# Patient Record
Sex: Female | Born: 1940 | Race: White | Hispanic: No | Marital: Married | Smoking: Former smoker
Health system: Southern US, Community
[De-identification: ages and names within clinical notes are randomized; demographics above are authoritative.]

## PROBLEM LIST (undated history)

## (undated) DIAGNOSIS — K222 Esophageal obstruction: Secondary | ICD-10-CM

## (undated) DIAGNOSIS — M1990 Unspecified osteoarthritis, unspecified site: Secondary | ICD-10-CM

## (undated) DIAGNOSIS — E785 Hyperlipidemia, unspecified: Secondary | ICD-10-CM

## (undated) DIAGNOSIS — J329 Chronic sinusitis, unspecified: Secondary | ICD-10-CM

## (undated) DIAGNOSIS — U071 COVID-19: Secondary | ICD-10-CM

## (undated) DIAGNOSIS — I6529 Occlusion and stenosis of unspecified carotid artery: Secondary | ICD-10-CM

## (undated) DIAGNOSIS — G500 Trigeminal neuralgia: Secondary | ICD-10-CM

## (undated) DIAGNOSIS — I8290 Acute embolism and thrombosis of unspecified vein: Secondary | ICD-10-CM

## (undated) DIAGNOSIS — K449 Diaphragmatic hernia without obstruction or gangrene: Secondary | ICD-10-CM

## (undated) DIAGNOSIS — K219 Gastro-esophageal reflux disease without esophagitis: Secondary | ICD-10-CM

## (undated) HISTORY — DX: Acute embolism and thrombosis of unspecified vein: I82.90

## (undated) HISTORY — DX: Gastro-esophageal reflux disease without esophagitis: K21.9

## (undated) HISTORY — DX: Hyperlipidemia, unspecified: E78.5

## (undated) HISTORY — DX: Occlusion and stenosis of unspecified carotid artery: I65.29

## (undated) HISTORY — DX: COVID-19: U07.1

## (undated) HISTORY — DX: Chronic sinusitis, unspecified: J32.9

## (undated) HISTORY — DX: Esophageal obstruction: K22.2

## (undated) HISTORY — DX: Diaphragmatic hernia without obstruction or gangrene: K44.9

## (undated) HISTORY — PX: CYSTECTOMY: SUR359

## (undated) HISTORY — DX: Unspecified osteoarthritis, unspecified site: M19.90

## (undated) HISTORY — DX: Trigeminal neuralgia: G50.0

---

## 1968-07-18 HISTORY — PX: BREAST LUMPECTOMY: SHX2

## 1998-05-20 ENCOUNTER — Ambulatory Visit (HOSPITAL_COMMUNITY): Admission: RE | Admit: 1998-05-20 | Discharge: 1998-05-20 | Payer: Self-pay | Admitting: Gastroenterology

## 1999-06-22 ENCOUNTER — Other Ambulatory Visit: Admission: RE | Admit: 1999-06-22 | Discharge: 1999-06-22 | Payer: Self-pay | Admitting: *Deleted

## 2000-06-13 ENCOUNTER — Encounter: Payer: Self-pay | Admitting: Gastroenterology

## 2000-06-13 ENCOUNTER — Encounter: Admission: RE | Admit: 2000-06-13 | Discharge: 2000-06-13 | Payer: Self-pay | Admitting: Gastroenterology

## 2000-06-23 ENCOUNTER — Other Ambulatory Visit: Admission: RE | Admit: 2000-06-23 | Discharge: 2000-06-23 | Payer: Self-pay | Admitting: *Deleted

## 2001-06-27 ENCOUNTER — Other Ambulatory Visit: Admission: RE | Admit: 2001-06-27 | Discharge: 2001-06-27 | Payer: Self-pay | Admitting: Obstetrics and Gynecology

## 2002-07-03 ENCOUNTER — Other Ambulatory Visit: Admission: RE | Admit: 2002-07-03 | Discharge: 2002-07-03 | Payer: Self-pay | Admitting: Obstetrics and Gynecology

## 2002-07-29 ENCOUNTER — Encounter: Admission: RE | Admit: 2002-07-29 | Discharge: 2002-07-29 | Payer: Self-pay | Admitting: Obstetrics and Gynecology

## 2002-07-29 ENCOUNTER — Encounter: Payer: Self-pay | Admitting: Obstetrics and Gynecology

## 2003-05-14 ENCOUNTER — Ambulatory Visit (HOSPITAL_COMMUNITY): Admission: RE | Admit: 2003-05-14 | Discharge: 2003-05-14 | Payer: Self-pay | Admitting: Gastroenterology

## 2004-10-26 ENCOUNTER — Ambulatory Visit (HOSPITAL_COMMUNITY): Admission: RE | Admit: 2004-10-26 | Discharge: 2004-10-26 | Payer: Self-pay | Admitting: Gastroenterology

## 2011-09-27 ENCOUNTER — Other Ambulatory Visit: Payer: Self-pay

## 2011-10-24 ENCOUNTER — Ambulatory Visit (INDEPENDENT_AMBULATORY_CARE_PROVIDER_SITE_OTHER): Payer: Medicare Other | Admitting: General Surgery

## 2011-10-24 ENCOUNTER — Encounter (INDEPENDENT_AMBULATORY_CARE_PROVIDER_SITE_OTHER): Payer: Self-pay | Admitting: General Surgery

## 2011-10-24 VITALS — BP 128/68 | HR 72 | Temp 98.9°F | Resp 16 | Ht 66.5 in | Wt 133.4 lb

## 2011-10-24 DIAGNOSIS — R928 Other abnormal and inconclusive findings on diagnostic imaging of breast: Secondary | ICD-10-CM

## 2011-10-24 NOTE — Patient Instructions (Signed)
You will be scheduled for a right breast biopsy with needle localization in the near future.   Breast Biopsy WHY YOU NEED A BIOPSY Your caregiver has recommended that you have a breast tissue sample taken (biopsy). This is done to be certain that the lump or abnormality found in your breast is not cancerous (malignant). During a biopsy, a small piece of tissue is removed, so it can be examined under a microscope by a specialist (pathologist) who looks at tissues and cells and diagnoses abnormalities in them. Most lumps (tumors) or abnormalities, on or in the breast, are not cancerous (benign). However, biopsies are taken when your caregiver cannot be absolutely certain of what is wrong only from doing a physical exam, mammogram (breast X-ray), or other studies. A breast biopsy can tell you whether nothing more needs to be done, or you need more surgery or another type of treatment. A biopsy is done when there is:  Any undiagnosed breast mass.   Nipple abnormalities, dimpling, crusting, or ulcerations.   Calcium deposits (calcifications) or abnormalities seen on your mammogram, ultrasound, or MRI.   Suspicious changes in the breast (thickening, asymmetry) seen on mammogram.   Abnormal discharge from the nipple, especially blood.   Redness, swelling, and pain of the breast.  HOW A BIOPSY IS PERFORMED A biopsy is often performed on an outpatient basis (you go home the same day). This can be done in a hospital, clinic, or surgical center. Tissue samples (biopsies) are often done under local anesthesia (area is numbed). Sometimes general anesthetics are required, in which case you sleep through the procedure. Biopsies may remove the entire lump, a small piece of the lump, or a small sliver of tissue removed by needle. TYPES OF BREAST BIOPSY  Fine needle aspiration. A thin needle is placed through the skin, to the lump or cyst, and cells are removed.   Core needle biopsy. A large needle with a  special tip is placed through the skin, to the abnormality, and a piece of tissue is removed.   Stereotactic biopsy. A core needle with a special X-ray is used, to direct the needle to the lump or abnormal area, which is difficult to feel or cannot be felt.   Vacuum-assisted biopsy. A hollow probe and a gentle vacuum remove a sample of tissue.   Ultrasound guided core needle biopsy. You lie on your stomach, with your breast through an opening, and a high frequency ultrasound helps guide the needle to the area of the abnormality.   Open biopsy. An incision is made in the breast, and a piece of the lump or the whole lump is removed.  LET YOUR CAREGIVER KNOW ABOUT:  Allergies.   Medicines taken, including herbs, eye drops, over-the-counter medicines, and creams.   Use of steroids (by mouth or creams).   Previous problems with anesthetics or Novocaine.   If you are taking aspirin or blood thinners.   Possibility of pregnancy, if this applies.   History of blood clots (thrombophlebitis).   History of bleeding or blood problems.   Previous surgery.   Other health problems.  RISKS AND COMPLICATIONS   Bleeding.   Infection.   Allergy to medicines.   Bruising and swelling of the breast.   Alteration in the shape of the breast.   Not finding the lump or abnormality.   Needing more surgery.  BEFORE THE PROCEDURE  You should arrive 60 minutes prior to your procedure or as directed.   Check-in at the admissions  desk, to fill out necessary forms, if you are not preregistered.   There will be consent forms to sign, prior to the procedure.   There is a waiting area for your family, while you are having your biopsy.   Try to have someone with you, to drive you home.   Do not smoke for 2 weeks before the surgery.   Let your caregiver know if you develop a cold or an infection.   Do not drink alcohol for at least 24 hours before surgery.   Wear a good support bra to the  surgery.  AFTER THE PROCEDURE  After surgery, you will be taken to the recovery area, where a nurse will watch and check your progress. Once you are awake, stable, and taking fluids well, if there are no other problems, you will be allowed to go home.   Ice packs applied to your operative site may help with discomfort and keep the swelling down.   You may resume normal diet and activities as directed. Avoid strenuous activities affecting the arm on the side of the biopsy, such as tennis, swimming, heavy lifting (more than 10 pounds) or pulling.   Bruising in the breast is normal following this procedure.   Wearing a support bra, even to bed, may be more comfortable. The bra will also help keep the dressing on.   Change dressings as directed.   Your doctor may apply a pressure dressing on your breast for 24 to 48 hours.   Only take over-the-counter or prescription medicines for pain, discomfort, or fever as directed by your caregiver.   Do not take aspirin, because it can cause bleeding.  HOME CARE INSTRUCTIONS   You may resume your usual diet.   Have someone drive you home after the surgery.   Do not do any exercise, driving, lifting or general activities without your caregiver's permission.   Take medicines and over-the-counter medicines, as ordered by your caregiver.   Keep your postoperative appointments as recommended.   Do not drink alcohol while taking pain medicine.  Finding out the results of your test Not all test results are available during your visit. If your test results are not back during the visit, make an appointment with your caregiver to find out the results. Do not assume everything is normal if you have not heard from your caregiver or the medical facility. It is important for you to follow up on all of your test results.  SEEK MEDICAL CARE IF:   You notice redness, swelling, or increasing pain in the wound.   You notice a bad smell coming from the wound or  dressing.   You develop a rash.   You need stronger pain medicine.   You are having an allergic reaction or problems with your medicines.  SEEK IMMEDIATE MEDICAL CARE IF:   You have difficulty breathing.   You have a fever.   There is increased bleeding (more than a small spot) from the wound.   Pus is coming from the wound.   The wound is breaking open.  Document Released: 07/04/2005 Document Revised: 06/23/2011 Document Reviewed: 05/22/2009 [facility] Patient Information 2012 [place].

## 2011-10-24 NOTE — Progress Notes (Signed)
Patient ID: Ashley Carrillo, female   DOB: 1940/12/14, 71 y.o.   MRN: [id number]  Chief Complaint  Patient presents with  . Breast Problem    new pt- eval Rt breast radial scar    HPI SHAINDEL Carrillo is a 71 y.o. female.  She is referred by Dr. Jacob Moores at [facility] health for evaluation of abnormal mammogram of the right breast, upper outer quadrant. Ashley Carrillo is her primary care physician.  The patient has no prior history of breast cancer. She had a right breast biopsy in 1970 and a needle biopsy of the right breast in 1990 for benign disease, possibly cysts. Recent screening mammogram showed a small, 6 mm irregular mass in the right breast at the 10:00 position 8 cm from the nipple. Image guided biopsy shows radial scar and complex sclerosing lesion. She was referred for consideration of excision of this area to rule out occult breast cancer.  The patient has no breast symptoms. No pain, no lump, no nipple discharge.  Family history is negative for breast cancer and negative for ovarian cancer.  Her only medical problem is chronic ulcerative colitis followed by Earle Gell. HPI  Past Medical History  Diagnosis Date  . Ulcerative colitis   . Arthritis   . GERD (gastroesophageal reflux disease)   . Hyperlipidemia   . Ulcerative colitis     Past Surgical History  Procedure Date  . Breast lumpectomy 1970    right breast- benign  . Cystectomy     finger    Family History  Problem Relation Age of Onset  . Heart disease Mother     Social History History  Substance Use Topics  . Smoking status: Former Smoker    Quit date: 07/18/1968  . Smokeless tobacco: Not on file  . Alcohol Use: No    Allergies  Allergen Reactions  . Codeine Nausea And Vomiting    Current Outpatient Prescriptions  Medication Sig Dispense Refill  . Cimetidine (ACID REDUCER PO) Take 75 mg by mouth daily.      . fexofenadine (ALLEGRA) 180 MG tablet Take 180 mg by mouth daily.      .  Loperamide HCl (ANTI-DIARRHEAL PO) Take 1 mg by mouth daily.      . SULFASALAZINE PO Take 1,000 mg by mouth 2 (two) times daily.        Review of Systems Review of Systems  Constitutional: Negative for fever, chills and unexpected weight change.  HENT: Negative for hearing loss, congestion, sore throat, trouble swallowing and voice change.   Eyes: Negative for visual disturbance.  Respiratory: Negative for cough and wheezing.   Cardiovascular: Negative for chest pain, palpitations and leg swelling.  Gastrointestinal: Negative for nausea, vomiting, abdominal pain, diarrhea, constipation, blood in stool, abdominal distention and anal bleeding.  Genitourinary: Negative for hematuria, vaginal bleeding and difficulty urinating.  Musculoskeletal: Negative for arthralgias.  Skin: Negative for rash and wound.  Neurological: Negative for seizures, syncope and headaches.  Hematological: Negative for adenopathy. Does not bruise/bleed easily.  Psychiatric/Behavioral: Negative for confusion.    Blood pressure 128/68, pulse 72, temperature 98.9 F (37.2 C), temperature source Temporal, resp. rate 16, height 5' 6.5" (1.689 m), weight 133 lb 6.4 oz (60.51 kg).  Physical Exam Physical Exam  Constitutional: She is oriented to person, place, and time. She appears well-developed and well-nourished. No distress.  HENT:  Head: Normocephalic and atraumatic.  Nose: Nose normal.  Mouth/Throat: No oropharyngeal exudate.  Eyes: Conjunctivae and EOM are normal.  Pupils are equal, round, and reactive to light. Left eye exhibits no discharge. No scleral icterus.  Neck: Neck supple. No JVD present. No tracheal deviation present. No thyromegaly present.  Cardiovascular: Normal rate, regular rhythm, normal heart sounds and intact distal pulses.   No murmur heard. Pulmonary/Chest: Effort normal and breath sounds normal. No respiratory distress. She has no wheezes. She has no rales. She exhibits no tenderness.     Abdominal: Soft. Bowel sounds are normal. She exhibits no distension and no mass. There is no tenderness. There is no rebound and no guarding.  Musculoskeletal: She exhibits no edema and no tenderness.  Lymphadenopathy:    She has no cervical adenopathy.  Neurological: She is alert and oriented to person, place, and time. She exhibits normal muscle tone. Coordination normal.  Skin: Skin is warm. No rash noted. She is not diaphoretic. No erythema. No pallor.  Psychiatric: She has a normal mood and affect. Her behavior is normal. Judgment and thought content normal.    Data Reviewed I have reviewed her mammograms, her pathology report.  Assessment    Abnormal mammogram right breast, upper outer quadrant, radial scar and sclerosing lesion by image guided biopsy. Excision of this area is warranted to rule out occult carcinoma. Risk of this is approximately 9%.  Chronic ulcerative colitis    Plan    The patient would like this area excised, which is appropriate medically. We will schedule for right partial mastectomy with needle localization.  I have discussed the indications and details and techniques of surgery with the patient and her husband. The risks have been outlined including but not limited to bleeding, infection, cosmetic deformity, skin necrosis, reoperation for complications or cancer, and other unforeseen problems. She understands these issues. Her questions were answered. She agrees with this plan.       Edsel Petrin. Dalbert Batman, M.D., [facility] General and Minimally invasive Surgery Breast and Colorectal Surgery Office:   [phone] Pager:   [phone]  10/24/2011, 11:54 AM

## 2011-10-28 ENCOUNTER — Encounter (HOSPITAL_BASED_OUTPATIENT_CLINIC_OR_DEPARTMENT_OTHER): Payer: Self-pay | Admitting: *Deleted

## 2011-10-28 NOTE — Progress Notes (Signed)
No labs needed

## 2011-10-31 NOTE — H&P (Signed)
Ashley Carrillo    MRN: [id number]   Description: 71 year old female  Provider: Adin Hector, MD  Department: [facility]     Diagnoses     Abnormal mammogram   - Primary    793.80      Reason for Visit     Breast Problem    new pt- eval Rt breast radial scar     Vitals    BP Pulse Temp(Src) Resp Ht Wt    128/68  72  98.9 F (37.2 C) (Temporal)  16  5' 6.5" (1.689 m)  133 lb 6.4 oz (60.51 kg)      BMI -  21.21 kg/m2                History and Physical   Adin Hector, MD d Patient ID: Ashley Carrillo, female   DOB: Apr 09, 1941, 71 y.o.   MRN: [id number]             HPI Ashley Carrillo is a 71 y.o. female.  She is referred by Dr. Jacob Moores at [facility] health for evaluation of abnormal mammogram of the right breast, upper outer quadrant. [name] is her primary care physician.  The patient has no prior history of breast cancer. She had a right breast biopsy in 1970 and a needle biopsy of the right breast in 1990 for benign disease, possibly cysts. Recent screening mammogram showed a small, 6 mm irregular mass in the right breast at the 10:00 position 8 cm from the nipple. Image guided biopsy shows radial scar and complex sclerosing lesion. She was referred for consideration of excision of this area to rule out occult breast cancer.  The patient has no breast symptoms. No pain, no lump, no nipple discharge.  Family history is negative for breast cancer and negative for ovarian cancer.  Her only medical problem is chronic ulcerative colitis followed by Earle Gell.     Past Medical History   Diagnosis  Date   .  Ulcerative colitis     .  Arthritis     .  GERD (gastroesophageal reflux disease)     .  Hyperlipidemia     .  Ulcerative colitis         Past Surgical History   Procedure  Date   .  Breast lumpectomy  1970       right breast- benign   .  Cystectomy         finger       Family History   Problem  Relation  Age of Onset   .  Heart  disease  Mother        Social History History   Substance Use Topics   .  Smoking status:  Former Smoker       Quit date:  07/18/1968   .  Smokeless tobacco:  Not on file   .  Alcohol Use:  No       Allergies   Allergen  Reactions   .  Codeine  Nausea And Vomiting       Current Outpatient Prescriptions   Medication  Sig  Dispense  Refill   .  Cimetidine (ACID REDUCER PO)  Take 75 mg by mouth daily.         .  fexofenadine (ALLEGRA) 180 MG tablet  Take 180 mg by mouth daily.         .  Loperamide HCl (ANTI-DIARRHEAL PO)  Take 1  mg by mouth daily.         .  SULFASALAZINE PO  Take 1,000 mg by mouth 2 (two) times daily.            Review of Systems  Constitutional: Negative for fever, chills and unexpected weight change.  HENT: Negative for hearing loss, congestion, sore throat, trouble swallowing and voice change.   Eyes: Negative for visual disturbance.  Respiratory: Negative for cough and wheezing.   Cardiovascular: Negative for chest pain, palpitations and leg swelling.  Gastrointestinal: Negative for nausea, vomiting, abdominal pain, diarrhea, constipation, blood in stool, abdominal distention and anal bleeding.  Genitourinary: Negative for hematuria, vaginal bleeding and difficulty urinating.  Musculoskeletal: Negative for arthralgias.  Skin: Negative for rash and wound.  Neurological: Negative for seizures, syncope and headaches.  Hematological: Negative for adenopathy. Does not bruise/bleed easily.  Psychiatric/Behavioral: Negative for confusion.    Blood pressure 128/68, pulse 72, temperature 98.9 F (37.2 C), temperature source Temporal, resp. rate 16, height 5' 6.5" (1.689 m), weight 133 lb 6.4 oz (60.51 kg).  Physical Exam  Constitutional: She is oriented to person, place, and time. She appears well-developed and well-nourished. No distress.  HENT:   Head: Normocephalic and atraumatic.   Nose: Nose normal.   Mouth/Throat: No oropharyngeal exudate.  Eyes:  Conjunctivae and EOM are normal. Pupils are equal, round, and reactive to light. Left eye exhibits no discharge. No scleral icterus.  Neck: Neck supple. No JVD present. No tracheal deviation present. No thyromegaly present.  Cardiovascular: Normal rate, regular rhythm, normal heart sounds and intact distal pulses.    No murmur heard. Pulmonary/Chest: Effort normal and breath sounds normal. No respiratory distress. She has no wheezes. She has no rales. She exhibits no tenderness. Right breast with old scar inferiorly. No mass bilaterally, no other skin change bilaterally, no axillary adenopathy bilaterally. Abdominal: Soft. Bowel sounds are normal. She exhibits no distension and no mass. There is no tenderness. There is no rebound and no guarding.  Musculoskeletal: She exhibits no edema and no tenderness.  Lymphadenopathy:    She has no cervical adenopathy.  Neurological: She is alert and oriented to person, place, and time. She exhibits normal muscle tone. Coordination normal.  Skin: Skin is warm. No rash noted. She is not diaphoretic. No erythema. No pallor.  Psychiatric: She has a normal mood and affect. Her behavior is normal. Judgment and thought content normal.    Data Reviewed I have reviewed her mammograms, her pathology report.   Assessment   Abnormal mammogram right breast, upper outer quadrant, radial scar and sclerosing lesion by image guided biopsy. Excision of this area is warranted to rule out occult carcinoma. Risk of this is approximately 9%.  Chronic ulcerative colitis   Plan The patient would like this area excised, which is appropriate medically. We will schedule for right partial mastectomy with needle localization.  I have discussed the indications and details and techniques of surgery with the patient and her husband. The risks have been outlined including but not limited to bleeding, infection, cosmetic deformity, skin necrosis, reoperation for complications or  cancer, and other unforeseen problems. She understands these issues. Her questions were answered. She agrees with this plan.    Edsel Petrin. Dalbert Batman, M.D., [facility] General and Minimally invasive Surgery Breast and Colorectal Surgery Office:   [phone] Pager:   [phone]

## 2011-11-01 ENCOUNTER — Encounter (HOSPITAL_BASED_OUTPATIENT_CLINIC_OR_DEPARTMENT_OTHER): Payer: Self-pay | Admitting: Anesthesiology

## 2011-11-01 ENCOUNTER — Encounter (INDEPENDENT_AMBULATORY_CARE_PROVIDER_SITE_OTHER): Payer: Self-pay | Admitting: Family Medicine

## 2011-11-01 ENCOUNTER — Encounter (HOSPITAL_BASED_OUTPATIENT_CLINIC_OR_DEPARTMENT_OTHER)
Admission: RE | Disposition: A | Discharge status: Home / Self Care | Payer: Self-pay | Source: Ambulatory Visit | Attending: General Surgery

## 2011-11-01 ENCOUNTER — Ambulatory Visit (HOSPITAL_BASED_OUTPATIENT_CLINIC_OR_DEPARTMENT_OTHER): Payer: Medicare Other | Admitting: Anesthesiology

## 2011-11-01 ENCOUNTER — Encounter (HOSPITAL_BASED_OUTPATIENT_CLINIC_OR_DEPARTMENT_OTHER): Payer: Self-pay | Admitting: *Deleted

## 2011-11-01 ENCOUNTER — Telehealth (INDEPENDENT_AMBULATORY_CARE_PROVIDER_SITE_OTHER): Payer: Self-pay

## 2011-11-01 ENCOUNTER — Ambulatory Visit (HOSPITAL_BASED_OUTPATIENT_CLINIC_OR_DEPARTMENT_OTHER)
Admission: RE | Admit: 2011-11-01 | Discharge: 2011-11-01 | Disposition: A | Discharge status: Home / Self Care | Payer: Medicare Other | Source: Ambulatory Visit | Attending: General Surgery | Admitting: General Surgery

## 2011-11-01 DIAGNOSIS — N6019 Diffuse cystic mastopathy of unspecified breast: Secondary | ICD-10-CM

## 2011-11-01 DIAGNOSIS — K219 Gastro-esophageal reflux disease without esophagitis: Secondary | ICD-10-CM | POA: Insufficient documentation

## 2011-11-01 DIAGNOSIS — N6029 Fibroadenosis of unspecified breast: Secondary | ICD-10-CM | POA: Insufficient documentation

## 2011-11-01 DIAGNOSIS — R928 Other abnormal and inconclusive findings on diagnostic imaging of breast: Secondary | ICD-10-CM

## 2011-11-01 HISTORY — PX: BREAST BIOPSY: SHX20

## 2011-11-01 SURGERY — BREAST BIOPSY WITH NEEDLE LOCALIZATION
Anesthesia: General | Site: Breast | Laterality: Right | Wound class: Clean

## 2011-11-01 MED ORDER — MORPHINE SULFATE 2 MG/ML IJ SOLN: 2.0000 mg | INTRAMUSCULAR | Status: DC | PRN

## 2011-11-01 MED ORDER — FENTANYL CITRATE 0.05 MG/ML IJ SOLN: 50.0000 ug | INTRAMUSCULAR | Status: DC | PRN

## 2011-11-01 MED ORDER — DEXAMETHASONE SODIUM PHOSPHATE 4 MG/ML IJ SOLN
INTRAMUSCULAR | Status: DC | PRN
  Administered 2011-11-01: 10 mg via INTRAVENOUS

## 2011-11-01 MED ORDER — ACETAMINOPHEN 325 MG PO TABS: 650.0000 mg | ORAL_TABLET | ORAL | Status: DC | PRN

## 2011-11-01 MED ORDER — HYDROMORPHONE HCL PF 1 MG/ML IJ SOLN: 0.2500 mg | INTRAMUSCULAR | Status: DC | PRN

## 2011-11-01 MED ORDER — MIDAZOLAM HCL 2 MG/2ML IJ SOLN: 1.0000 mg | INTRAMUSCULAR | Status: DC | PRN

## 2011-11-01 MED ORDER — ONDANSETRON HCL 4 MG/2ML IJ SOLN
4.0000 mg | Freq: Four times a day (QID) | INTRAMUSCULAR | Status: DC | PRN
  Administered 2011-11-01: 4 mg via INTRAVENOUS

## 2011-11-01 MED ORDER — CHLORHEXIDINE GLUCONATE 4 % EX LIQD: 1.0000 "application " | Freq: Once | CUTANEOUS | Status: DC

## 2011-11-01 MED ORDER — LIDOCAINE HCL (CARDIAC) 20 MG/ML IV SOLN
INTRAVENOUS | Status: DC | PRN
  Administered 2011-11-01: 40 mg via INTRAVENOUS

## 2011-11-01 MED ORDER — HYDROCODONE-ACETAMINOPHEN 5-325 MG PO TABS: 1.0000 | ORAL_TABLET | ORAL | Status: AC | PRN

## 2011-11-01 MED ORDER — OXYCODONE HCL 5 MG PO TABS: 5.0000 mg | ORAL_TABLET | ORAL | Status: DC | PRN

## 2011-11-01 MED ORDER — LORAZEPAM 2 MG/ML IJ SOLN: 1.0000 mg | Freq: Once | INTRAMUSCULAR | Status: DC | PRN

## 2011-11-01 MED ORDER — SODIUM CHLORIDE 0.9 % IJ SOLN: 3.0000 mL | Freq: Two times a day (BID) | INTRAMUSCULAR | Status: DC

## 2011-11-01 MED ORDER — CEFAZOLIN SODIUM 1-5 GM-% IV SOLN
1.0000 g | INTRAVENOUS | Status: AC
  Administered 2011-11-01: 1 g via INTRAVENOUS

## 2011-11-01 MED ORDER — BUPIVACAINE-EPINEPHRINE 0.5% -1:200000 IJ SOLN
INTRAMUSCULAR | Status: DC | PRN
  Administered 2011-11-01: 10 mL

## 2011-11-01 MED ORDER — ACETAMINOPHEN 650 MG RE SUPP: 650.0000 mg | RECTAL | Status: DC | PRN

## 2011-11-01 MED ORDER — PROPOFOL 10 MG/ML IV EMUL
INTRAVENOUS | Status: DC | PRN
  Administered 2011-11-01: 120 mg via INTRAVENOUS

## 2011-11-01 MED ORDER — MIDAZOLAM HCL 5 MG/5ML IJ SOLN
INTRAMUSCULAR | Status: DC | PRN
  Administered 2011-11-01: 1 mg via INTRAVENOUS

## 2011-11-01 MED ORDER — SODIUM CHLORIDE 0.9 % IJ SOLN: 3.0000 mL | INTRAMUSCULAR | Status: DC | PRN

## 2011-11-01 MED ORDER — SODIUM CHLORIDE 0.9 % IV SOLN: 12.5000 mg | Freq: Once | INTRAVENOUS | Status: DC

## 2011-11-01 MED ORDER — ONDANSETRON HCL 4 MG/2ML IJ SOLN
INTRAMUSCULAR | Status: DC | PRN
  Administered 2011-11-01: 4 mg via INTRAVENOUS

## 2011-11-01 MED ORDER — FENTANYL CITRATE 0.05 MG/ML IJ SOLN
INTRAMUSCULAR | Status: DC | PRN
  Administered 2011-11-01: 50 ug via INTRAVENOUS

## 2011-11-01 MED ORDER — EPHEDRINE SULFATE 50 MG/ML IJ SOLN
INTRAMUSCULAR | Status: DC | PRN
  Administered 2011-11-01 (×2): 10 mg via INTRAVENOUS

## 2011-11-01 MED ORDER — PROMETHAZINE HCL 25 MG/ML IJ SOLN
6.2500 mg | INTRAMUSCULAR | Status: DC | PRN
  Administered 2011-11-01: 12.5 mg via INTRAVENOUS

## 2011-11-01 MED ORDER — HEPARIN SODIUM (PORCINE) 5000 UNIT/ML IJ SOLN: 5000.0000 [IU] | Freq: Once | INTRAMUSCULAR | Status: DC

## 2011-11-01 MED ORDER — SODIUM CHLORIDE 0.9 % IV SOLN: 250.0000 mL | INTRAVENOUS | Status: DC | PRN

## 2011-11-01 MED ORDER — SODIUM CHLORIDE 0.9 % IV SOLN: INTRAVENOUS | Status: DC

## 2011-11-01 MED ORDER — LACTATED RINGERS IV SOLN
INTRAVENOUS | Status: DC
  Administered 2011-11-01: 12:00:00 via INTRAVENOUS

## 2011-11-01 SURGICAL SUPPLY — 51 items
BANDAGE ELASTIC 6 VELCRO ST LF (GAUZE/BANDAGES/DRESSINGS) IMPLANT
BENZOIN TINCTURE PRP APPL 2/3 (GAUZE/BANDAGES/DRESSINGS) IMPLANT
BLADE HEX COATED 2.75 (ELECTRODE) ×2 IMPLANT
BLADE SURG 15 STRL LF DISP TIS (BLADE) ×2 IMPLANT
BLADE SURG 15 STRL SS (BLADE) ×2
CANISTER SUCTION 1200CC (MISCELLANEOUS) ×2 IMPLANT
CHLORAPREP W/TINT 26ML (MISCELLANEOUS) ×2 IMPLANT
CLOTH BEACON ORANGE TIMEOUT ST (SAFETY) ×2 IMPLANT
COVER MAYO STAND STRL (DRAPES) ×2 IMPLANT
COVER TABLE BACK 60X90 (DRAPES) ×2 IMPLANT
DECANTER SPIKE VIAL GLASS SM (MISCELLANEOUS) IMPLANT
DERMABOND ADVANCED (GAUZE/BANDAGES/DRESSINGS) ×1
DERMABOND ADVANCED .7 DNX12 (GAUZE/BANDAGES/DRESSINGS) ×1 IMPLANT
DEVICE DUBIN W/COMP PLATE 8390 (MISCELLANEOUS) ×2 IMPLANT
DRAPE LAPAROTOMY TRNSV 102X78 (DRAPE) IMPLANT
DRAPE PED LAPAROTOMY (DRAPES) ×2 IMPLANT
DRAPE UTILITY XL STRL (DRAPES) ×2 IMPLANT
ELECT REM PT RETURN 9FT ADLT (ELECTROSURGICAL) ×2
ELECTRODE REM PT RTRN 9FT ADLT (ELECTROSURGICAL) ×1 IMPLANT
GAUZE SPONGE 4X4 12PLY STRL LF (GAUZE/BANDAGES/DRESSINGS) IMPLANT
GAUZE SPONGE 4X4 16PLY XRAY LF (GAUZE/BANDAGES/DRESSINGS) IMPLANT
GLOVE EUDERMIC 7 POWDERFREE (GLOVE) ×2 IMPLANT
GOWN PREVENTION PLUS XLARGE (GOWN DISPOSABLE) ×2 IMPLANT
GOWN PREVENTION PLUS XXLARGE (GOWN DISPOSABLE) ×2 IMPLANT
KIT MARKER MARGIN INK (KITS) ×2 IMPLANT
NEEDLE HYPO 22GX1.5 SAFETY (NEEDLE) IMPLANT
NEEDLE HYPO 25X1 1.5 SAFETY (NEEDLE) ×2 IMPLANT
NS IRRIG 1000ML POUR BTL (IV SOLUTION) ×2 IMPLANT
PACK BASIN DAY SURGERY FS (CUSTOM PROCEDURE TRAY) ×2 IMPLANT
PENCIL BUTTON HOLSTER BLD 10FT (ELECTRODE) ×2 IMPLANT
SLEEVE SCD COMPRESS KNEE MED (MISCELLANEOUS) ×2 IMPLANT
SPONGE LAP 4X18 X RAY DECT (DISPOSABLE) ×2 IMPLANT
STAPLER VISISTAT 35W (STAPLE) IMPLANT
STRIP CLOSURE SKIN 1/2X4 (GAUZE/BANDAGES/DRESSINGS) IMPLANT
SUT ETHILON 4 0 PS 2 18 (SUTURE) IMPLANT
SUT MON AB 4-0 PC3 18 (SUTURE) ×2 IMPLANT
SUT SILK 2 0 SH (SUTURE) ×2 IMPLANT
SUT VIC AB 2-0 SH 27 (SUTURE)
SUT VIC AB 2-0 SH 27XBRD (SUTURE) IMPLANT
SUT VIC AB 3-0 FS2 27 (SUTURE) IMPLANT
SUT VIC AB 4-0 P-3 18XBRD (SUTURE) IMPLANT
SUT VIC AB 4-0 P3 18 (SUTURE)
SUT VICRYL 3-0 CR8 SH (SUTURE) ×2 IMPLANT
SUT VICRYL 4-0 PS2 18IN ABS (SUTURE) IMPLANT
SYR BULB 3OZ (MISCELLANEOUS) ×2 IMPLANT
SYR CONTROL 10ML LL (SYRINGE) ×2 IMPLANT
TAPE HYPAFIX 4 X10 (GAUZE/BANDAGES/DRESSINGS) IMPLANT
TOWEL OR NON WOVEN STRL DISP B (DISPOSABLE) ×2 IMPLANT
TUBE CONNECTING 20X1/4 (TUBING) ×2 IMPLANT
WATER STERILE IRR 1000ML POUR (IV SOLUTION) IMPLANT
YANKAUER SUCT BULB TIP NO VENT (SUCTIONS) ×2 IMPLANT

## 2011-11-01 NOTE — Anesthesia Procedure Notes (Signed)
Procedure Name: LMA Insertion Date/Time: 11/01/2011 12:15 PM Performed by: Lyndee Leo Pre-anesthesia Checklist: Patient identified, Emergency Drugs available, Suction available and Patient being monitored Patient Re-evaluated:Patient Re-evaluated prior to inductionOxygen Delivery Method: Circle system utilized Preoxygenation: Pre-oxygenation with 100% oxygen Intubation Type: IV induction Ventilation: Mask ventilation without difficulty LMA: LMA inserted LMA Size: 4.0 Number of attempts: 1 Tube secured with: Tape Dental Injury: Teeth and Oropharynx as per pre-operative assessment

## 2011-11-01 NOTE — Anesthesia Postprocedure Evaluation (Signed)
  Anesthesia Post-op Note  Patient: Ashley Carrillo  Procedure(s) Performed: Procedure(s) (LRB): BREAST BIOPSY WITH NEEDLE LOCALIZATION (Right)  Patient Location: PACU  Anesthesia Type: General  Level of Consciousness: awake and alert   Airway and Oxygen Therapy: Patient Spontanous Breathing  Post-op Pain: mild  Post-op Assessment: Post-op Vital signs reviewed, Patient's Cardiovascular Status Stable, Respiratory Function Stable, Patent Airway, No signs of Nausea or vomiting, Adequate PO intake and Pain level controlled  Post-op Vital Signs: stable  Complications: No apparent anesthesia complications

## 2011-11-01 NOTE — Transfer of Care (Signed)
Immediate Anesthesia Transfer of Care Note  Patient: Ashley Carrillo  Procedure(s) Performed: Procedure(s) (LRB): BREAST BIOPSY WITH NEEDLE LOCALIZATION (Right)  Patient Location: PACU  Anesthesia Type: General  Level of Consciousness: sedated  Airway & Oxygen Therapy: Patient Spontanous Breathing and Patient connected to face mask oxygen  Post-op Assessment: Report given to PACU RN and Post -op Vital signs reviewed and stable  Post vital signs: Reviewed and stable  Complications: No apparent anesthesia complications

## 2011-11-01 NOTE — Anesthesia Preprocedure Evaluation (Signed)
Anesthesia Evaluation  Patient identified by MRN, date of birth, ID band Patient awake    Reviewed: Allergy & Precautions, H&P , NPO status , Patient's Chart, lab work & pertinent test results  Airway Mallampati: I TM Distance: >3 FB Neck ROM: Full    Dental   Pulmonary    Pulmonary exam normal       Cardiovascular     Neuro/Psych    GI/Hepatic PUD, GERD-  ,  Endo/Other    Renal/GU      Musculoskeletal   Abdominal   Peds  Hematology   Anesthesia Other Findings   Reproductive/Obstetrics                           Anesthesia Physical Anesthesia Plan  ASA: II  Anesthesia Plan: General   Post-op Pain Management:    Induction: Intravenous  Airway Management Planned: LMA  Additional Equipment:   Intra-op Plan:   Post-operative Plan: Extubation in OR  Informed Consent: I have reviewed the patients History and Physical, chart, labs and discussed the procedure including the risks, benefits and alternatives for the proposed anesthesia with the patient or authorized representative who has indicated his/her understanding and acceptance.     Plan Discussed with: CRNA and Surgeon  Anesthesia Plan Comments:         Anesthesia Quick Evaluation

## 2011-11-01 NOTE — Discharge Instructions (Signed)
[facility] °Office Phone Number [phone] ° °BREAST BIOPSY/ PARTIAL MASTECTOMY: POST OP INSTRUCTIONS ° °Always review your discharge instruction sheet given to you by the facility where your surgery was performed. ° °IF YOU HAVE DISABILITY OR FAMILY LEAVE FORMS, YOU MUST BRING THEM TO THE OFFICE FOR PROCESSING.  DO NOT GIVE THEM TO YOUR DOCTOR. ° °1. A prescription for pain medication may be given to you upon discharge.  Take your pain medication as prescribed, if needed.  If narcotic pain medicine is not needed, then you may take acetaminophen (Tylenol) or ibuprofen (Advil) as needed. °2. Take your usually prescribed medications unless otherwise directed °3. If you need a refill on your pain medication, please contact your pharmacy.  They will contact our office to request authorization.  Prescriptions will not be filled after 5pm or on week-ends. °4. You should eat very light the first 24 hours after surgery, such as soup, crackers, pudding, etc.  Resume your normal diet the day after surgery. °5. Most patients will experience some swelling and bruising in the breast.  Ice packs and a good support bra will help.  Swelling and bruising can take several days to resolve.  °6. It is common to experience some constipation if taking pain medication after surgery.  Increasing fluid intake and taking a stool softener will usually help or prevent this problem from occurring.  A mild laxative (Milk of Magnesia or Miralax) should be taken according to package directions if there are no bowel movements after 48 hours. °7. Unless discharge instructions indicate otherwise, you may remove your bandages 24-48 hours after surgery, and you may shower at that time.  You may have steri-strips (small skin tapes) in place directly over the incision.  These strips should be left on the skin for 7-10 days.  If your surgeon used skin glue on the incision, you may shower in 24 hours.  The glue will flake off over the  next 2-3 weeks.  Any sutures or staples will be removed at the office during your follow-up visit. °8. ACTIVITIES:  You may resume regular daily activities (gradually increasing) beginning the next day.  Wearing a good support bra or sports bra minimizes pain and swelling.  You may have sexual intercourse when it is comfortable. °a. You may drive when you no longer are taking prescription pain medication, you can comfortably wear a seatbelt, and you can safely maneuver your car and apply brakes. °b. RETURN TO WORK:  ______________________________________________________________________________________ °9. You should see your doctor in the office for a follow-up appointment approximately two weeks after your surgery.  Your doctor’s nurse will typically make your follow-up appointment when she calls you with your pathology report.  Expect your pathology report 2-3 business days after your surgery.  You may call to check if you do not hear from us after three days. °10. OTHER INSTRUCTIONS: _______________________________________________________________________________________________ _____________________________________________________________________________________________________________________________________ °_____________________________________________________________________________________________________________________________________ °_____________________________________________________________________________________________________________________________________ ° °WHEN TO CALL YOUR DOCTOR: °1. Fever over 101.0 °2. Nausea and/or vomiting. °3. Extreme swelling or bruising. °4. Continued bleeding from incision. °5. Increased pain, redness, or drainage from the incision. ° °The clinic staff is available to answer your questions during regular business hours.  Please don’t hesitate to call and ask to speak to one of the nurses for clinical concerns.  If you have a medical emergency, go to the nearest  emergency room or call 911.  A surgeon from [facility] is always on call at the hospital. ° °For further questions, please visit [email or web address]  ° ° °  Post Anesthesia Home Care Instructions ° °Activity: °Get plenty of rest for the remainder of the day. A responsible adult should stay with you for 24 hours following the procedure.  °For the next 24 hours, DO NOT: °-Drive a car °-Operate machinery °-Drink alcoholic beverages °-Take any medication unless instructed by your physician °-Make any legal decisions or sign important papers. ° °Meals: °Start with liquid foods such as gelatin or soup. Progress to regular foods as tolerated. Avoid greasy, spicy, heavy foods. If nausea and/or vomiting occur, drink only clear liquids until the nausea and/or vomiting subsides. Call your physician if vomiting continues. ° °Special Instructions/Symptoms: °Your throat may feel dry or sore from the anesthesia or the breathing tube placed in your throat during surgery. If this causes discomfort, gargle with warm salt water. The discomfort should disappear within 24 hours. ° °

## 2011-11-01 NOTE — Telephone Encounter (Signed)
Message copied by Dois Davenport on Tue Nov 01, 2011  2:32 PM ------      Message from: Adin Hector      Created: Tue Nov 01, 2011  1:06 PM       Surgery performed at [facility] today:            Right partial mastectomy with needle localization            (Note assist)                  Please call path to patient Thursday

## 2011-11-01 NOTE — Interval H&P Note (Signed)
History and Physical Interval Note:  1/51/7616 07:37 AM  Ashley Carrillo  has presented today for surgery, with the diagnosis of radial scar right breast abnormal mammogram  The goals of treatment and the various methods of treatment have been discussed with the patient and family. After consideration of risks, benefits and other options for treatment, the patient has consented to  Procedure(s) (LRB): BREAST BIOPSY WITH NEEDLE LOCALIZATION (Right) as a surgical intervention .  The patients' history has been reviewed and the patient examined today, no change in status, stable for surgery.  I have reviewed the patients' chart and labs.  Questions were answered to the patient's satisfaction.     Adin Hector

## 2011-11-01 NOTE — Op Note (Signed)
Patient Name:           Ashley Carrillo   Date of Surgery:        11/01/2011  Pre op Diagnosis:      Radial scar, right breast, upper outer quadrant  Post op Diagnosis:    same  Procedure:                 Right partial mastectomy with needle localization  Surgeon:                     Edsel Petrin. Dalbert Batman, M.D., [place]  Assistant:                      none  Operative Indications:   [name] is a 71 y.o. female. She is referred by Dr. Jacob Moores at [facility] for evaluation of abnormal mammogram of the right breast, upper outer quadrant. [name] is her primary care physician. The patient has no prior history of breast cancer. She had a right breast biopsy in 1970 and a needle biopsy of the right breast in 1990 for benign disease, possibly cysts. Recent screening mammogram showed a small, 6 mm irregular mass in the right breast at the 10:00 position 8 cm from the nipple. Image guided biopsy shows radial scar and complex sclerosing lesion. She was referred for consideration of excision of this area to rule out occult breast cancer. The patient has no breast symptoms. No pain, no lump, no nipple discharge.  Family history is negative for breast cancer and negative for ovarian cancer. She was evaluated and counseled as an outpatient. She is brought to the operating room electively.   Operative Findings:       The localizing wire entered the right breast laterally and was directed from inferior to superior. The wire was placed immediately adjacent to the marker clip. The specimen mammogram showed the wire and the clip in the center of the specimen.  Procedure in Detail:          Following the induction of general endotracheal anesthesia the patient's right breast was prepped and draped in a sterile fashion and intravenous antibiotics were given. I reviewed the mammograms that were performed following placement of the wire. 0.5% Marcaine with epinephrine was used as local infiltration  anesthetic. I chose to use a transverse,  radially oriented incision at about the 9:30 position. Dissection was carried down into the breast tissue around the localizing wire. The specimen was removed and marked with a 6 color marker ink kit.   Specimen mammogram looked good, showing the marker clip and the wire in the center of the specimen. The case was discussed with Dr. Jacob Moores at [facility]. The specimen was sent to pathology. Hemostasis excellent and achieved with electrocautery. The wound was irrigated with saline. The breast tissues were closed with interrupted sutures of 3-0 Vicryl and the skin closed with a running subcuticular suture of 4-0 Monocryl and Dermabond. The patient was taken to recovery room stable. There were no complications. EBL 10 cc. Counts correct.     Edsel Petrin. Dalbert Batman, M.D., FACS General and Minimally Invasive Surgery Breast and Colorectal Surgery  11/01/2011 12:58 PM

## 2011-11-02 ENCOUNTER — Encounter (HOSPITAL_BASED_OUTPATIENT_CLINIC_OR_DEPARTMENT_OTHER): Payer: Self-pay | Admitting: General Surgery

## 2011-11-04 ENCOUNTER — Telehealth (INDEPENDENT_AMBULATORY_CARE_PROVIDER_SITE_OTHER): Payer: Self-pay

## 2011-11-04 NOTE — Telephone Encounter (Signed)
Pt notified of path result and PO appt made. Appt 5-22 per pt request she declined earlier appt.

## 2011-11-14 ENCOUNTER — Encounter (INDEPENDENT_AMBULATORY_CARE_PROVIDER_SITE_OTHER): Payer: Medicare Other | Admitting: General Surgery

## 2011-11-25 ENCOUNTER — Encounter (INDEPENDENT_AMBULATORY_CARE_PROVIDER_SITE_OTHER): Payer: Self-pay

## 2011-12-07 ENCOUNTER — Encounter (INDEPENDENT_AMBULATORY_CARE_PROVIDER_SITE_OTHER): Payer: Self-pay | Admitting: General Surgery

## 2011-12-07 ENCOUNTER — Ambulatory Visit (INDEPENDENT_AMBULATORY_CARE_PROVIDER_SITE_OTHER): Payer: Medicare Other | Admitting: General Surgery

## 2011-12-07 VITALS — BP 126/60 | HR 81 | Temp 97.8°F | Ht 66.5 in | Wt 135.6 lb

## 2011-12-07 DIAGNOSIS — R928 Other abnormal and inconclusive findings on diagnostic imaging of breast: Secondary | ICD-10-CM

## 2011-12-07 NOTE — Patient Instructions (Signed)
Your right breast biopsy pathology report shows only benign changes, scar tissue, and fibrocystic disease. There is no evidence of malignancy or premalignant changes. Your wound is healing normally.  Nothing further needs to be done. Be sure to get a breast exam with your primary care physician in one year, and also get annual mammography from here on out.  Return to see Dr. Dalbert Batman if further problems arise.

## 2011-12-07 NOTE — Progress Notes (Signed)
Subjective:     Patient ID: Ashley Carrillo, female   DOB: 07/24/1940, 71 y.o.   MRN: [id number]  HPI This patient underwent right partial mastectomy with needle localization and November 01, 2011. Preoperative images showed a small density in the right breast upper outer quadrant and image guided biopsy showed radial scar.  Her final pathology following partial mastectomy shows fibrocystic disease, biopsy changes, but no atypia.  She is healing uneventfully and has no complaints about her breasts at this time. Review of Systems     Objective:   Physical Exam Right breast exam reveals healing radial incision right breast 9:00 position. No sign of infection or hematoma. Contour, breast volume, and nipple projection look very good. She is not tender.    Assessment:     Radial scar and fibrocystic change, right breast, upper outer quadrant. Uneventful recovery in the early postoperative following right partial mastectomy with needle localization.    Plan:     Wound care and activities discussed.  Annual mammography and breast exam with her primary care physician  Return to see me p.r.n.   Edsel Petrin. Dalbert Batman, M.D., [facility] General and Minimally invasive Surgery Breast and Colorectal Surgery Office:   [phone] Pager:   [phone]

## 2011-12-08 ENCOUNTER — Encounter (INDEPENDENT_AMBULATORY_CARE_PROVIDER_SITE_OTHER): Payer: Self-pay

## 2012-01-13 ENCOUNTER — Other Ambulatory Visit: Payer: Self-pay | Admitting: Surgery

## 2012-06-27 ENCOUNTER — Other Ambulatory Visit: Payer: Self-pay | Admitting: Gastroenterology

## 2012-07-03 ENCOUNTER — Encounter (HOSPITAL_COMMUNITY)
Admission: RE | Disposition: A | Discharge status: Home / Self Care | Payer: Self-pay | Source: Ambulatory Visit | Attending: Gastroenterology

## 2012-07-03 ENCOUNTER — Ambulatory Visit (HOSPITAL_COMMUNITY)
Admission: RE | Admit: 2012-07-03 | Discharge: 2012-07-03 | Disposition: A | Discharge status: Home / Self Care | Payer: Medicare Other | Source: Ambulatory Visit | Attending: Gastroenterology | Admitting: Gastroenterology

## 2012-07-03 ENCOUNTER — Encounter (HOSPITAL_COMMUNITY): Payer: Self-pay | Admitting: *Deleted

## 2012-07-03 DIAGNOSIS — K222 Esophageal obstruction: Secondary | ICD-10-CM | POA: Insufficient documentation

## 2012-07-03 HISTORY — PX: BALLOON DILATION: SHX5330

## 2012-07-03 HISTORY — PX: ESOPHAGOGASTRODUODENOSCOPY (EGD) WITH ESOPHAGEAL DILATION: SHX5812

## 2012-07-03 SURGERY — ESOPHAGOGASTRODUODENOSCOPY (EGD) WITH ESOPHAGEAL DILATION
Anesthesia: Moderate Sedation

## 2012-07-03 MED ORDER — BUTAMBEN-TETRACAINE-BENZOCAINE 2-2-14 % EX AERO
INHALATION_SPRAY | CUTANEOUS | Status: DC | PRN
  Administered 2012-07-03: 2 via TOPICAL

## 2012-07-03 MED ORDER — MIDAZOLAM HCL 10 MG/2ML IJ SOLN
INTRAMUSCULAR | Status: DC | PRN
  Administered 2012-07-03 (×2): 2 mg via INTRAVENOUS

## 2012-07-03 MED ORDER — ONDANSETRON HCL 4 MG/2ML IJ SOLN
INTRAMUSCULAR | Status: AC
  Filled 2012-07-03: qty 2

## 2012-07-03 MED ORDER — MIDAZOLAM HCL 10 MG/2ML IJ SOLN
INTRAMUSCULAR | Status: AC
  Filled 2012-07-03: qty 2

## 2012-07-03 MED ORDER — FENTANYL CITRATE 0.05 MG/ML IJ SOLN
INTRAMUSCULAR | Status: DC | PRN
  Administered 2012-07-03: 25 ug via INTRAVENOUS

## 2012-07-03 MED ORDER — FENTANYL CITRATE 0.05 MG/ML IJ SOLN
INTRAMUSCULAR | Status: AC
  Filled 2012-07-03: qty 2

## 2012-07-03 MED ORDER — SODIUM CHLORIDE 0.9 % IV SOLN
INTRAVENOUS | Status: DC
  Administered 2012-07-03: 500 mL via INTRAVENOUS

## 2012-07-03 MED ORDER — ONDANSETRON HCL 4 MG/2ML IJ SOLN
INTRAMUSCULAR | Status: DC | PRN
  Administered 2012-07-03: 4 mg via INTRAVENOUS

## 2012-07-03 NOTE — Op Note (Signed)
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  Endoscopist: Earle Gell  Premedication: Zofran 4 mg intravenously. Versed 4 mg intravenously. Fentanyl 25 mcg intravenously.  Procedure: The patient was placed in the left lateral decubitus position. The Pentax gastroscope was passed through the posterior hypopharynx into the proximal esophagus without difficulty. The hypopharynx, larynx, and vocal cords appeared normal.  Esophagoscopy: The proximal and mid segments of the esophageal mucosa appear normal. The squamocolumnar junction is noted at 40 cm from the incisor teeth associated with a benign esophageal stricture less than 1 cm in length. There is no endoscopic evidence for the presence of erosive esophagitis or Barrett's esophagus. 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  Gastroscopy: Retroflex view of the gastric cardia and fundus was normal. The gastric body, antrum, and pylorus appeared normal.  Duodenoscopy: The duodenal bulb and descending duodenum appeared normal.  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 No signs of Barrett's esophagus. Otherwise normal esophagogastroduodenoscopy.  Recommendations: I will place the patient on omeprazole 20 mg before breakfast each morning.

## 2012-07-03 NOTE — H&P (Signed)
Problem: Esophageal dysphagia associated with a benign distal esophageal stricture.  History: The patient is a 71 year old female born Sep 21, 1940.  2006, the patient underwent a diagnostic esophagogastroduodenoscopy to evaluate dysphagia. A benign stricture at the esophagogastric junction was dilated using the 16 mm Savary dilator.  The patient has developed solid food dysphagia without odynophagia. She is scheduled to undergo a repeat esophagogastroduodenoscopy with balloon dilation of the distal esophageal stricture.  Medication allergies: Codeine.  Chronic medications: Sulfasalazine.  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 Chronic ulcerative proctosigmoiditis diagnosed in 1971. Adenomatous colon polyp removed from the descending colon in 1999. Hypercholesterolemia. 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  Exam: The patient is alert and lying comfortably on the endoscopy stretcher. Lungs are clear to auscultation. Cardiac exam reveals a regular rhythm without audible murmurs. Abdomen is soft, flat, and nontender to palpation in all quadrants.  Plan: Proceed with esophagogastroduodenoscopy with balloon dilation of the distal esophageal stricture.

## 2012-07-04 ENCOUNTER — Encounter (HOSPITAL_COMMUNITY): Payer: Self-pay | Admitting: Gastroenterology

## 2013-09-30 ENCOUNTER — Other Ambulatory Visit: Payer: Self-pay | Admitting: Gastroenterology

## 2013-11-12 ENCOUNTER — Ambulatory Visit (HOSPITAL_COMMUNITY)
Admission: RE | Admit: 2013-11-12 | Discharge: 2013-11-12 | Disposition: A | Discharge status: Home / Self Care | Payer: Medicare Other | Source: Ambulatory Visit | Attending: Gastroenterology | Admitting: Gastroenterology

## 2013-11-12 ENCOUNTER — Encounter (HOSPITAL_COMMUNITY): Payer: Self-pay | Admitting: *Deleted

## 2013-11-12 ENCOUNTER — Encounter (HOSPITAL_COMMUNITY)
Admission: RE | Disposition: A | Discharge status: Home / Self Care | Payer: Self-pay | Source: Ambulatory Visit | Attending: Gastroenterology

## 2013-11-12 DIAGNOSIS — Z860100 Personal history of colon polyps, unspecified: Secondary | ICD-10-CM | POA: Insufficient documentation

## 2013-11-12 DIAGNOSIS — E7800 Pure hypercholesterolemia, unspecified: Secondary | ICD-10-CM | POA: Insufficient documentation

## 2013-11-12 DIAGNOSIS — K219 Gastro-esophageal reflux disease without esophagitis: Secondary | ICD-10-CM | POA: Insufficient documentation

## 2013-11-12 DIAGNOSIS — K222 Esophageal obstruction: Secondary | ICD-10-CM | POA: Insufficient documentation

## 2013-11-12 DIAGNOSIS — K5130 Ulcerative (chronic) rectosigmoiditis without complications: Secondary | ICD-10-CM | POA: Insufficient documentation

## 2013-11-12 DIAGNOSIS — K449 Diaphragmatic hernia without obstruction or gangrene: Secondary | ICD-10-CM | POA: Insufficient documentation

## 2013-11-12 HISTORY — PX: BALLOON DILATION: SHX5330

## 2013-11-12 HISTORY — PX: ESOPHAGOGASTRODUODENOSCOPY: SHX5428

## 2013-11-12 SURGERY — EGD (ESOPHAGOGASTRODUODENOSCOPY)
Anesthesia: Moderate Sedation

## 2013-11-12 MED ORDER — FENTANYL CITRATE 0.05 MG/ML IJ SOLN
INTRAMUSCULAR | Status: AC
  Filled 2013-11-12: qty 2

## 2013-11-12 MED ORDER — MIDAZOLAM HCL 10 MG/2ML IJ SOLN
INTRAMUSCULAR | Status: DC | PRN
  Administered 2013-11-12 (×2): 2 mg via INTRAVENOUS

## 2013-11-12 MED ORDER — ONDANSETRON HCL 4 MG/2ML IJ SOLN
4.0000 mg | Freq: Once | INTRAMUSCULAR | Status: AC
  Administered 2013-11-12: 4 mg via INTRAVENOUS

## 2013-11-12 MED ORDER — MIDAZOLAM HCL 10 MG/2ML IJ SOLN
INTRAMUSCULAR | Status: AC
  Filled 2013-11-12: qty 2

## 2013-11-12 MED ORDER — BUTAMBEN-TETRACAINE-BENZOCAINE 2-2-14 % EX AERO
INHALATION_SPRAY | CUTANEOUS | Status: DC | PRN
  Administered 2013-11-12: 2 via TOPICAL

## 2013-11-12 MED ORDER — FENTANYL CITRATE 0.05 MG/ML IJ SOLN
INTRAMUSCULAR | Status: DC | PRN
  Administered 2013-11-12 (×2): 25 ug via INTRAVENOUS

## 2013-11-12 MED ORDER — ONDANSETRON HCL 4 MG/2ML IJ SOLN
INTRAMUSCULAR | Status: AC
  Filled 2013-11-12: qty 2

## 2013-11-12 MED ORDER — SODIUM CHLORIDE 0.9 % IV SOLN
INTRAVENOUS | Status: DC
  Administered 2013-11-12: 500 mL via INTRAVENOUS

## 2013-11-12 NOTE — H&P (Signed)
  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 She chronically takes Zantac at bedtime. Omeprazole caused gastrointestinal side effects. The patient has developed esophageal dysphagia. 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  Past medical history: Ulcerative proctosigmoiditis since 9. Small adenomatous colon polyp removed colonoscopically in 1999. Hypercholesterolemia. 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  Medication allergies: Codeine  Exam: The patient is alert and lying comfortably on the endoscopy stretcher. Lungs are clear to auscultation. Cardiac exam reveals a regular rhythm. Abdomen is soft and nontender to palpation.  Plan: Proceed with diagnostic esophagogastroduodenoscopy with benign esophageal stricture dilation

## 2013-11-12 NOTE — Op Note (Signed)
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 Versed 4 mg. Fentanyl 25 mcg.  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 The Pentax gastroscope was passed through the posterior hypopharynx into the proximal esophagus without difficulty. The vocal cords were not visualized.  Esophagoscopy: The proximal and mid segments of the esophageal mucosa appeared normal. 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 There was no evidence of erosive esophagitis or Barrett's esophagus. 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 Retroflex view gastric cardia and fundus was normal. The gastric body, antrum, and pylorus appeared normal.  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

## 2013-11-12 NOTE — Discharge Instructions (Signed)
Gastrointestinal Endoscopy °Care After °Refer to this sheet in the next few weeks. These instructions provide you with information on caring for yourself after your procedure. Your caregiver may also give you more specific instructions. Your treatment has been planned according to current medical practices, but problems sometimes occur. Call your caregiver if you have any problems or questions after your procedure. °HOME CARE INSTRUCTIONS °· If you were given medicine to help you relax (sedative), do not drive, operate machinery, or sign important documents for 24 hours. °· Avoid alcohol and hot or warm beverages for the first 24 hours after the procedure. °· Only take over-the-counter or prescription medicines for pain, discomfort, or fever as directed by your caregiver. You may resume taking your normal medicines unless your caregiver tells you otherwise. Ask your caregiver when you may resume taking medicines that may cause bleeding, such as aspirin, clopidogrel, or warfarin. °· You may return to your normal diet and activities on the day after your procedure, or as directed by your caregiver. Walking may help to reduce any bloated feeling in your abdomen. °· Drink enough fluids to keep your urine clear or pale yellow. °· You may gargle with salt water if you have a sore throat. °SEEK IMMEDIATE MEDICAL CARE IF: °· You have severe nausea or vomiting. °· You have severe abdominal pain, abdominal cramps that last longer than 6 hours, or abdominal swelling (distention). °· You have severe shoulder or back pain. °· You have trouble swallowing. °· You have shortness of breath, your breathing is shallow, or you are breathing faster than normal. °· You have a fever or a rapid heartbeat. °· You vomit blood or material that looks like coffee grounds. °· You have bloody, black, or tarry stools. °MAKE SURE YOU: °· Understand these instructions. °· Will watch your condition. °· Will get help right away if you are not doing  well or get worse. °Document Released: 02/16/2004 Document Revised: 01/03/2012 Document Reviewed: 10/04/2011 °ExitCare® Patient Information ©2014 [employer]. ° °

## 2013-11-13 ENCOUNTER — Encounter (HOSPITAL_COMMUNITY): Payer: Self-pay | Admitting: Gastroenterology

## 2015-09-21 ENCOUNTER — Encounter (HOSPITAL_COMMUNITY): Payer: Self-pay | Admitting: *Deleted

## 2015-09-21 ENCOUNTER — Ambulatory Visit (HOSPITAL_COMMUNITY): Payer: Medicare HMO | Admitting: Anesthesiology

## 2015-09-21 ENCOUNTER — Encounter (HOSPITAL_COMMUNITY)
Admission: RE | Disposition: A | Discharge status: Home / Self Care | Payer: Self-pay | Source: Ambulatory Visit | Attending: Gastroenterology

## 2015-09-21 ENCOUNTER — Other Ambulatory Visit: Payer: Self-pay | Admitting: Gastroenterology

## 2015-09-21 ENCOUNTER — Ambulatory Visit (HOSPITAL_COMMUNITY)
Admission: RE | Admit: 2015-09-21 | Discharge: 2015-09-21 | Disposition: A | Discharge status: Home / Self Care | Payer: Medicare HMO | Source: Ambulatory Visit | Attending: Gastroenterology | Admitting: Gastroenterology

## 2015-09-21 DIAGNOSIS — Z87891 Personal history of nicotine dependence: Secondary | ICD-10-CM | POA: Diagnosis not present

## 2015-09-21 DIAGNOSIS — K222 Esophageal obstruction: Secondary | ICD-10-CM | POA: Insufficient documentation

## 2015-09-21 DIAGNOSIS — M1990 Unspecified osteoarthritis, unspecified site: Secondary | ICD-10-CM | POA: Diagnosis not present

## 2015-09-21 DIAGNOSIS — D120 Benign neoplasm of cecum: Secondary | ICD-10-CM | POA: Insufficient documentation

## 2015-09-21 DIAGNOSIS — Z86010 Personal history of colonic polyps: Secondary | ICD-10-CM | POA: Diagnosis not present

## 2015-09-21 DIAGNOSIS — Z1211 Encounter for screening for malignant neoplasm of colon: Secondary | ICD-10-CM | POA: Diagnosis present

## 2015-09-21 DIAGNOSIS — E7800 Pure hypercholesterolemia, unspecified: Secondary | ICD-10-CM | POA: Insufficient documentation

## 2015-09-21 DIAGNOSIS — K5130 Ulcerative (chronic) rectosigmoiditis without complications: Secondary | ICD-10-CM | POA: Diagnosis not present

## 2015-09-21 DIAGNOSIS — K269 Duodenal ulcer, unspecified as acute or chronic, without hemorrhage or perforation: Secondary | ICD-10-CM | POA: Diagnosis not present

## 2015-09-21 HISTORY — PX: COLONOSCOPY WITH PROPOFOL: SHX5780

## 2015-09-21 HISTORY — PX: ESOPHAGOGASTRODUODENOSCOPY (EGD) WITH PROPOFOL: SHX5813

## 2015-09-21 SURGERY — COLONOSCOPY WITH PROPOFOL
Anesthesia: Monitor Anesthesia Care

## 2015-09-21 MED ORDER — PROPOFOL 500 MG/50ML IV EMUL
INTRAVENOUS | Status: DC | PRN
  Administered 2015-09-21: 50 mg via INTRAVENOUS

## 2015-09-21 MED ORDER — DEXAMETHASONE SODIUM PHOSPHATE 10 MG/ML IJ SOLN
INTRAMUSCULAR | Status: DC | PRN
  Administered 2015-09-21: 10 mg via INTRAVENOUS

## 2015-09-21 MED ORDER — ONDANSETRON HCL 4 MG/2ML IJ SOLN
INTRAMUSCULAR | Status: DC | PRN
  Administered 2015-09-21: 4 mg via INTRAVENOUS

## 2015-09-21 MED ORDER — PROPOFOL 500 MG/50ML IV EMUL
INTRAVENOUS | Status: DC | PRN
  Administered 2015-09-21: 100 ug/kg/min via INTRAVENOUS

## 2015-09-21 MED ORDER — PROPOFOL 10 MG/ML IV BOLUS
INTRAVENOUS | Status: AC
  Filled 2015-09-21: qty 40

## 2015-09-21 MED ORDER — LACTATED RINGERS IV SOLN
INTRAVENOUS | Status: DC | PRN
  Administered 2015-09-21: 11:00:00 via INTRAVENOUS

## 2015-09-21 SURGICAL SUPPLY — 24 items

## 2015-09-21 NOTE — Discharge Instructions (Signed)
Esophagogastroduodenoscopy, Care After Refer to this sheet in the next few weeks. These instructions provide you with information about caring for yourself after your procedure. Your health care provider may also give you more specific instructions. Your treatment has been planned according to current medical practices, but problems sometimes occur. Call your health care provider if you have any problems or questions after your procedure. WHAT TO EXPECT AFTER THE PROCEDURE After your procedure, it is typical to feel:  Soreness in your throat.  Pain with swallowing.  Sick to your stomach (nauseous).  Bloated.  Dizzy.  Fatigued. HOME CARE INSTRUCTIONS  Do not eat or drink anything until the numbing medicine (local anesthetic) has worn off and your gag reflex has returned. You will know that the local anesthetic has worn off when you can swallow comfortably.  Do not drive or operate machinery until directed by your health care provider.  Take medicines only as directed by your health care provider. SEEK MEDICAL CARE IF:   You cannot stop coughing.  You are not urinating at all or less than usual. SEEK IMMEDIATE MEDICAL CARE IF:  You have difficulty swallowing.  You cannot eat or drink.  You have worsening throat or chest pain.  You have dizziness or lightheadedness or you faint.  You have nausea or vomiting.  You have chills.  You have a fever.  You have severe abdominal pain.  You have black, tarry, or bloody stools.   This information is not intended to replace advice given to you by your health care provider. Make sure you discuss any questions you have with your health care provider.   Document Released: 06/20/2012 Document Revised: 07/25/2014 Document Reviewed: 06/20/2012 Elsevier Interactive Patient Education 2016 [employer].   Colonoscopy, Care After These instructions give you information on caring for yourself after your procedure. Your doctor may also  give you more specific instructions. Call your doctor if you have any problems or questions after your procedure. HOME CARE  Do not drive for 24 hours.  Do not sign important papers or use machinery for 24 hours.  You may shower.  You may go back to your usual activities, but go slower for the first 24 hours.  Take rest breaks often during the first 24 hours.  Walk around or use warm packs on your belly (abdomen) if you have belly cramping or gas.  Drink enough fluids to keep your pee (urine) clear or pale yellow.  Resume your normal diet. Avoid heavy or fried foods.  Avoid drinking alcohol for 24 hours or as told by your doctor.  Only take medicines as told by your doctor. If a tissue sample (biopsy) was taken during the procedure:   Do not take aspirin or blood thinners for 7 days, or as told by your doctor.  Do not drink alcohol for 7 days, or as told by your doctor.  Eat soft foods for the first 24 hours. GET HELP IF: You still have a small amount of blood in your poop (stool) 2-3 days after the procedure. GET HELP RIGHT AWAY IF:  You have more than a small amount of blood in your poop.  You see clumps of tissue (blood clots) in your poop.  Your belly is puffy (swollen).  You feel sick to your stomach (nauseous) or throw up (vomit).  You have a fever.  You have belly pain that gets worse and medicine does not help. MAKE SURE YOU:  Understand these instructions.  Will watch your condition.  Will get help right away if you are not doing well or get worse.   This information is not intended to replace advice given to you by your health care provider. Make sure you discuss any questions you have with your health care provider.   Document Released: 08/06/2010 Document Revised: 07/09/2013 Document Reviewed: 03/11/2013 Elsevier Interactive Patient Education [employer].

## 2015-09-21 NOTE — H&P (Signed)
  Procedure: Surveillance colonoscopy and diagnostic esophagogastroduodenoscopy with benign distal esophageal stricture dilation. Chronic ulcerative proctosigmoiditis diagnosed in 1971. Small adenomatous colon polyp removed colonoscopically from the descending colon in 1999. 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  History: The patient is a 75 year old female born 1941/05/01. 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 She has developed intermittent solid food dysphagia. She has chronic ulcerative proctosigmoiditis and takes sulfasalazine 2000 mg daily.  She is scheduled to undergo surveillance colonoscopy and benign esophageal stricture dilation today  Medication allergies: Codeine. Warfarin.  Past medical history: Gastroesophageal reflux complicated by a benign distal esophageal stricture. Chronic ulcerative proctosigmoiditis diagnosed in 1971. Small adenomatous colon polyp removed colonoscopically in 1999. Hypercholesterolemia. Allergic rhinosinusitis. Osteopenia.  Exam: The patient is alert and lying comfortably on the endoscopy stretcher. Abdomen is soft and nontender to palpation. Lungs are clear to auscultation. Cardiac exam reveals a regular rhythm.  Plan: Proceed with surveillance colonoscopy and benign esophageal stricture dilation

## 2015-09-21 NOTE — Anesthesia Postprocedure Evaluation (Signed)
Anesthesia Post Note  Patient: Ashley Carrillo  Procedure(s) Performed: Procedure(s) (LRB): COLONOSCOPY WITH PROPOFOL (N/A) ESOPHAGOGASTRODUODENOSCOPY (EGD) WITH PROPOFOL (N/A)  Patient location during evaluation: PACU Anesthesia Type: MAC Level of consciousness: awake and alert Pain management: pain level controlled Vital Signs Assessment: post-procedure vital signs reviewed and stable Respiratory status: spontaneous breathing, nonlabored ventilation, respiratory function stable and patient connected to nasal cannula oxygen Cardiovascular status: stable and blood pressure returned to baseline Anesthetic complications: no    Last Vitals:  Filed Vitals:   09/21/15 1230 09/21/15 1237  BP: 124/95 132/42  Pulse:    Temp:    Resp:  14    Last Pain: There were no vitals filed for this visit.               Tiajuana Amass

## 2015-09-21 NOTE — Transfer of Care (Signed)
Immediate Anesthesia Transfer of Care Note  Patient: Ashley Carrillo  Procedure(s) Performed: Procedure(s): COLONOSCOPY WITH PROPOFOL (N/A) ESOPHAGOGASTRODUODENOSCOPY (EGD) WITH PROPOFOL (N/A)  Patient Location: PACU  Anesthesia Type:MAC  Level of Consciousness:  sedated, patient cooperative and responds to stimulation  Airway & Oxygen Therapy:Patient Spontanous Breathing and Patient connected to face mask oxgen  Post-op Assessment:  Report given to PACU RN and Post -op Vital signs reviewed and stable  Post vital signs:  Reviewed and stable  Last Vitals:  Filed Vitals:   09/21/15 1058  BP: 143/49  Pulse: 67  Temp: 36.6 C  Resp: 10    Complications: No apparent anesthesia complications

## 2015-09-21 NOTE — Op Note (Signed)
Problems: Chronic ulcerative proctosigmoiditis since 1971. Small adenomatous colon polyp removed colonoscopically from the descending colon in 1999. 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  Endoscopist: Earle Gell  Premedication: Propofol administered by anesthesia  Procedure: Esophagogastroduodenoscopy with balloon dilation of the benign stricture at the esophagogastric junction The patient was placed in the left lateral decubitus position. The Pentax gastroscope was passed through the posterior hypopharynx into the proximal esophagus without difficulty. The hypopharynx, larynx, and vocal cords appeared normal.  Esophagoscopy: The proximal and mid segments of the esophageal mucosa appeared normal. The esophagogastric junction was noted at 40 cm from the incisor teeth. There was no endoscopic evidence for the presence of erosive esophagitis or Barrett's esophagus. There was a benign stricture at the esophagogastric junction extending less than 1 cm in length. I was easily able to traverse the stricture and intravenous stomach. Using the esophageal balloon dilator, the benign stricture at the esophagogastric junction was dilated from 15 mm to 18 mm without apparent complications.  Gastroscopy: Retroflex view of the gastric cardia and fundus was normal. The gastric body, antrum, and pylorus appeared.  Duodenoscopy: There were a few scattered small erosions in the duodenal bulb. The descending duodenum appeared normal.  Assessment: A benign stricture at the esophagogastric junction was dilated from 15 mm to 18 mm without apparent complications. A few small erosions were noted in the duodenal bulb probably secondary to chronic sulfasalazine therapy.  Procedure: Surveillance colonoscopy Anal inspection and digital rectal exam were normal. The Pentax pediatric colonoscope was introduced into the rectum and advanced to the cecum. A normal-appearing ileocecal valve and appendiceal orifice  were identified. Colonic preparation for the exam today was good. Withdrawal time was 27 minutes  Rectum. Normal. Retroflexed view of the distal rectum was normal  Sigmoid colon and descending colon. Normal  Splenic flexure. Normal  Transverse colon. Normal  Hepatic flexure. Normal  Ascending colon. A 5 mm sessile polyp was removed with the cold snare and a 1 cm sessile polyp was removed in piecemeal fashion with the cold snare; an Endo Clip was applied to the polypectomy site to prevent bleeding  Cecum and ileocecal valve. A 5 mm sessile polyp was removed with the cold snare and cold biopsy forceps  Surveillance mucosal biopsies. A total of 32 random colon biopsies were performed to look for mucosal dysplasia. Eight biopsies were performed from the right colon, transverse colon, descending colon, and rectosigmoid colon.  Assessment: Inactive ulcerative proctosigmoiditis. A small polyp was removed from the cecum and two small polyps were removed from the ascending colon. Surveillance mucosal biopsies were performed to rule out mucosal dysplasia associated with ulcerative proctosigmoiditis

## 2015-09-21 NOTE — Anesthesia Preprocedure Evaluation (Signed)
Anesthesia Evaluation  Patient identified by MRN, date of birth, ID band Patient awake    Reviewed: Allergy & Precautions, NPO status , Patient's Chart, lab work & pertinent test results  History of Anesthesia Complications (+) PONV  Airway Mallampati: II  TM Distance: >3 FB Neck ROM: Full    Dental   Pulmonary former smoker,    breath sounds clear to auscultation       Cardiovascular negative cardio ROS   Rhythm:Regular Rate:Normal     Neuro/Psych negative neurological ROS     GI/Hepatic Neg liver ROS, PUD, GERD  ,  Endo/Other  negative endocrine ROS  Renal/GU negative Renal ROS     Musculoskeletal  (+) Arthritis ,   Abdominal   Peds  Hematology negative hematology ROS (+)   Anesthesia Other Findings   Reproductive/Obstetrics                             Anesthesia Physical Anesthesia Plan  ASA: II  Anesthesia Plan: MAC   Post-op Pain Management:    Induction:   Airway Management Planned: Natural Airway and Nasal Cannula  Additional Equipment:   Intra-op Plan:   Post-operative Plan:   Informed Consent: I have reviewed the patients History and Physical, chart, labs and discussed the procedure including the risks, benefits and alternatives for the proposed anesthesia with the patient or authorized representative who has indicated his/her understanding and acceptance.     Plan Discussed with: CRNA  Anesthesia Plan Comments:         Anesthesia Quick Evaluation

## 2016-03-23 ENCOUNTER — Encounter: Payer: Self-pay | Admitting: Podiatry

## 2016-03-23 ENCOUNTER — Ambulatory Visit (INDEPENDENT_AMBULATORY_CARE_PROVIDER_SITE_OTHER): Payer: Medicare HMO | Admitting: Podiatry

## 2016-03-23 VITALS — Resp 16 | Ht 66.5 in | Wt 130.0 lb

## 2016-03-23 DIAGNOSIS — B351 Tinea unguium: Secondary | ICD-10-CM

## 2016-03-23 DIAGNOSIS — L600 Ingrowing nail: Secondary | ICD-10-CM | POA: Diagnosis not present

## 2016-03-23 NOTE — Patient Instructions (Signed)

## 2016-03-23 NOTE — Progress Notes (Signed)
   Subjective:    Patient ID: KHRYSTYN MERRIOTT, female    DOB: May 01, 1941, 75 y.o.   MRN: [id number]  HPI Chief Complaint  Patient presents with  . Nail Problem    Left foot; great toe-lateral; nail discoloration; pt stated, "wants nail checked for ingrown toenail and to ask opinion if going to lose nail or whether will grow out"; x2 weeks       Review of Systems  HENT: Positive for hearing loss and tinnitus.   Allergic/Immunologic: Positive for environmental allergies.  All other systems reviewed and are negative.      Objective:   Physical Exam        Assessment & Plan:

## 2016-03-24 NOTE — Progress Notes (Signed)
Subjective:     Patient ID: Ashley Carrillo, female   DOB: January 31, 1941, 75 y.o.   MRN: [id number]  HPI patient presents stating oh inside of the left big toe is sore and incurvated in the nailbed itself is thickened and probably traumatized   Review of Systems  All other systems reviewed and are negative.      Objective:   Physical Exam  Constitutional: She is oriented to person, place, and time.  Cardiovascular: Intact distal pulses.   Musculoskeletal: Normal range of motion.  Neurological: She is oriented to person, place, and time.  Skin: Skin is warm.  Nursing note and vitals reviewed.  neurovascular status intact muscle strength adequate range of motion within normal limits with patient noted to have a damaged left hallux nail with incurvation of the lateral border that's painful when pressed with some looseness of the nailbed itself. Patient's found have good digital perfusion and is well oriented 3     Assessment:     Ingrown toenail deformity left hallux lateral border with overall looseness and damage to the nailplate    Plan:     H&P condition reviewed and went ahead today and recommended removal of the lateral border. I explained procedure and risk and today I infiltrated the left hallux 60 Milligan sector Marcaine mixture remove the lateral corner exposed matrix applied phenol 3 applications 30 seconds and trimming the remainder the nail. Explained may not grow out normally but we will watch it and see what it does

## 2016-04-12 ENCOUNTER — Telehealth: Payer: Self-pay | Admitting: *Deleted

## 2016-04-12 NOTE — Telephone Encounter (Signed)
Left message for patient at [phone] (Home #) to check to see how they are doing from their ingrown toenail that was performed on Wednesday, March 24, 2015. Waiting for a response.

## 2016-11-10 ENCOUNTER — Ambulatory Visit (INDEPENDENT_AMBULATORY_CARE_PROVIDER_SITE_OTHER): Payer: Medicare HMO

## 2016-11-10 ENCOUNTER — Ambulatory Visit (INDEPENDENT_AMBULATORY_CARE_PROVIDER_SITE_OTHER): Payer: Medicare HMO | Admitting: Podiatry

## 2016-11-10 DIAGNOSIS — M79671 Pain in right foot: Secondary | ICD-10-CM

## 2016-11-10 DIAGNOSIS — M779 Enthesopathy, unspecified: Secondary | ICD-10-CM

## 2016-11-10 DIAGNOSIS — M2041 Other hammer toe(s) (acquired), right foot: Secondary | ICD-10-CM

## 2016-11-10 DIAGNOSIS — L84 Corns and callosities: Secondary | ICD-10-CM | POA: Diagnosis not present

## 2016-11-10 MED ORDER — TRIAMCINOLONE ACETONIDE 10 MG/ML IJ SUSP
10.0000 mg | Freq: Once | INTRAMUSCULAR | Status: AC
  Administered 2016-11-10: 10 mg

## 2016-11-12 NOTE — Progress Notes (Signed)
Subjective:    Patient ID: Ashley Carrillo, female   DOB: 76 y.o.   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

## 2017-01-04 ENCOUNTER — Encounter: Payer: Self-pay | Admitting: Gastroenterology

## 2017-03-03 ENCOUNTER — Encounter (INDEPENDENT_AMBULATORY_CARE_PROVIDER_SITE_OTHER): Payer: Self-pay

## 2017-03-03 ENCOUNTER — Ambulatory Visit (INDEPENDENT_AMBULATORY_CARE_PROVIDER_SITE_OTHER): Payer: Medicare HMO | Admitting: Gastroenterology

## 2017-03-03 ENCOUNTER — Encounter: Payer: Self-pay | Admitting: Gastroenterology

## 2017-03-03 VITALS — BP 124/68 | HR 64 | Ht 66.5 in | Wt 136.0 lb

## 2017-03-03 DIAGNOSIS — K5150 Left sided colitis without complications: Secondary | ICD-10-CM

## 2017-03-03 NOTE — Progress Notes (Signed)
HPI :  76 y/o female, with history of ulcerative colitis - left sided disease, esophageal stricture, GERD referred by Dr. Earle Gell and Dr. [name] for history of ulcerative colitis and to establish care with [place].   Diagnosed in 1971 with left sided UC She has been on sulfsalazine over the years. She has been on prednisone over the years as needed for flares.  The last time she needed prednisone for a flare has been in the 1980s. She has been doing well for a long time.  She has never been on anti-TNFs or immunomodulators in the past.  She has been off all medications for a few years at least.   She reports rare dysphagia over the years. She has had GEJ stricture dilated in the past periodically for dysphagia. She is taking zantac q HS which works well for her GERD. She is not having any breakthrough heartburn or problems with dysphagia at present.   She is moving bowels well without any symptoms. She is having about 2 BMs per day. No diarrhea. She previously had urgency but no longer. She uses immodium rarely PRN. No abdominal pains.   Endoscopic history: EGD 09/21/2015 - benign stricture at the GEJ, dilated to 60mm Colonoscopy 09/21/2015 - normal colon, adenoma x 2(11mm), no dysplasia - done off all medications  No FH of Crohns or colitis. No CRC in the family.   Labs 01/04/17 at [facility]: WBC 6.8, Hgb 13.0, Plt 228, MCV 91 Normal BUN / Creatinine    Past Medical History:  Diagnosis Date  . Arthritis   . GERD (gastroesophageal reflux disease)   . Hyperlipidemia   . Ulcerative colitis      Past Surgical History:  Procedure Laterality Date  . BALLOON DILATION  07/03/2012   Procedure: BALLOON DILATION;  Surgeon: Garlan Fair, MD;  Location: [facility];  Service: Endoscopy;  Laterality: N/A;  . BALLOON DILATION N/A 11/12/2013   Procedure: BALLOON DILATION;  Surgeon: Garlan Fair, MD;  Location: [facility];  Service: Endoscopy;  Laterality: N/A;  . BREAST BIOPSY   11/01/2011   Procedure: BREAST BIOPSY WITH NEEDLE LOCALIZATION;  Surgeon: Adin Hector, MD;  Location: [facility];  Service: General;  Laterality: Right;  . BREAST LUMPECTOMY  1970   right breast- benign  . COLONOSCOPY WITH PROPOFOL N/A 09/21/2015   Procedure: COLONOSCOPY WITH PROPOFOL;  Surgeon: Garlan Fair, MD;  Location: [facility];  Service: Endoscopy;  Laterality: N/A;  . CYSTECTOMY     finger  . ESOPHAGOGASTRODUODENOSCOPY N/A 11/12/2013   Procedure: ESOPHAGOGASTRODUODENOSCOPY (EGD);  Surgeon: Garlan Fair, MD;  Location: [facility];  Service: Endoscopy;  Laterality: N/A;  . ESOPHAGOGASTRODUODENOSCOPY (EGD) WITH ESOPHAGEAL DILATION  07/03/2012   Procedure: ESOPHAGOGASTRODUODENOSCOPY (EGD) WITH ESOPHAGEAL DILATION;  Surgeon: Garlan Fair, MD;  Location: [facility];  Service: Endoscopy;  Laterality: N/A;  . ESOPHAGOGASTRODUODENOSCOPY (EGD) WITH PROPOFOL N/A 09/21/2015   Procedure: ESOPHAGOGASTRODUODENOSCOPY (EGD) WITH PROPOFOL;  Surgeon: Garlan Fair, MD;  Location: [facility];  Service: Endoscopy;  Laterality: N/A;   Family History  Problem Relation Age of Onset  . Heart disease Mother   . Prostate cancer Brother   . Diabetes Brother   . Colon cancer Neg Hx   . Stomach cancer Neg Hx    Social History  Substance Use Topics  . Smoking status: Former Smoker    Quit date: 07/18/1968  . Smokeless tobacco: Never Used  . Alcohol use No   Current Outpatient Prescriptions  Medication Sig Dispense Refill  . fexofenadine (ALLEGRA) 180 MG tablet Take 180 mg by mouth as needed.     . Loperamide HCl (ANTI-DIARRHEAL PO) Take 1 mg by mouth as needed.     . ranitidine (ZANTAC) 150 MG tablet Take 150 mg by mouth.    . SULFASALAZINE PO Take 500 mg by mouth 4 (four) times daily.      No current facility-administered medications for this visit.    Allergies  Allergen Reactions  . Amoxicillin-Pot Clavulanate Diarrhea  . Codeine Nausea And Vomiting  .  Simvastatin Other (See Comments)  . Warfarin Other (See Comments)     Review of Systems: All systems reviewed and negative except where noted in HPI.    Labs as above  Physical Exam: BP 124/68   Pulse 64   Ht 5' 6.5" (1.689 m)   Wt 136 lb (61.7 kg)   BMI 21.62 kg/m  Constitutional: Pleasant,well-developed, female in no acute distress. HEENT: Normocephalic and atraumatic. Conjunctivae are normal. No scleral icterus. Neck supple.  Cardiovascular: Normal rate, regular rhythm.  Pulmonary/chest: Effort normal and breath sounds normal. No wheezing, rales or rhonchi. Abdominal: Soft, nondistended, nontender. There are no masses palpable. No hepatomegaly. Extremities: no edema Lymphadenopathy: No cervical adenopathy noted. Neurological: Alert and oriented to person place and time. Skin: Skin is warm and dry. No rashes noted. Psychiatric: Normal mood and affect. Behavior is normal.   ASSESSMENT AND PLAN: 76 year old female with history of left-sided ulcerative colitis, here to establish care.  History reviewed as above, generally managed with sulfasalazine monotherapy for years with prednisone as needed for flares. She has not needed prednisone for several years. In fact she stopped sulfasalazine a few years ago and her last colonoscopy in 2017 off all medications, showed she was in deep remission with normal biopsies throughout. It's possible that her colitis has "burned out" given her duration of disease. She has no anemia and has no symptoms at present time. She will continue to hold sulfasalazine and monitor for symptoms, if she does develop symptoms in the future she will contact me. Otherwise we discussed if and when she wants any further surveillance colonsocopy exams. Given her duration of colitis historically one would consider surveillance colonoscopy every few years. She had 2 adenomas removed on her last exam. She wants to think about this, given her age, no plans for colonoscopy  at least for the next year. She will see me in 1 year for follow-up or sooner with any changes in her status or questions in the interim  All questions answered.  [place] Cellar, MD [facility] Pager [phone]  CC: [name], MD

## 2017-03-03 NOTE — Patient Instructions (Signed)
Follow up as needed with Dr. Havery [name]

## 2017-11-16 ENCOUNTER — Other Ambulatory Visit: Payer: Self-pay

## 2017-11-16 ENCOUNTER — Telehealth: Payer: Self-pay | Admitting: Gastroenterology

## 2017-11-16 DIAGNOSIS — K5180 Other ulcerative colitis without complications: Secondary | ICD-10-CM

## 2017-11-16 MED ORDER — PREDNISONE 10 MG PO TABS: ORAL_TABLET | ORAL | 0 refills | Status: DC

## 2017-11-16 NOTE — Telephone Encounter (Signed)
Thanks for the message, sorry to hear this.  She has not needed prednisone for a very long time.  Recommend she come to the lab for CBC, CMET, and stool for C Diff. If she wants to use prednisone we can give her prednisone 40mg  / day for 2 weeks, followed by slow taper, decreasing by 5mg  / week until done.  If this does not work or she gets worse she needs to contact us. Otherwise continue sulfasalazine and we may add Rowasa enemas pending her course.  Can you help book her a follow up visit with me in clinic. Thanks

## 2017-11-16 NOTE — Telephone Encounter (Signed)
Pt with history of UC. States she started having a flare 2 weeks ago but usually she gets better on her own. Pt reports she is having a lot of loose stools with lots of mucous in them. When the flare first started she had a lot of blood in the stool, there is not much there now. She is currently taking sulfasalazine 500mg  2 tabs BID. States prednisone has worked in the past. There are no available app appts. Please advise.

## 2017-11-16 NOTE — Telephone Encounter (Signed)
Spoke with pt and she is aware. Script sent to pharmacy. Pt states she will try to get labs done tomorrow but it may be Monday before she can get to the lab. Pt scheduled to see Dr. Havery [name] 01/16/18@10 :30am. Appt letter mailed to pt.

## 2017-11-20 ENCOUNTER — Other Ambulatory Visit (INDEPENDENT_AMBULATORY_CARE_PROVIDER_SITE_OTHER): Payer: Medicare HMO

## 2017-11-20 DIAGNOSIS — K5180 Other ulcerative colitis without complications: Secondary | ICD-10-CM | POA: Diagnosis not present

## 2017-11-20 LAB — COMPREHENSIVE METABOLIC PANEL
ALBUMIN: 4 g/dL (ref 3.5–5.2)
ALK PHOS: 46 U/L (ref 39–117)
ALT: 6 U/L (ref 0–35)
AST: 15 U/L (ref 0–37)
BUN: 17 mg/dL (ref 6–23)
CALCIUM: 9.7 mg/dL (ref 8.4–10.5)
CHLORIDE: 103 meq/L (ref 96–112)
CO2: 32 mEq/L (ref 19–32)
Creatinine, Ser: 0.82 mg/dL (ref 0.40–1.20)
GFR: 71.72 mL/min (ref >60.00)
Glucose, Bld: 88 mg/dL (ref 70–99)
Potassium: 3.9 mEq/L (ref 3.5–5.1)
Sodium: 141 mEq/L (ref 135–145)
Total Bilirubin: 0.3 mg/dL (ref 0.2–1.2)
Total Protein: 6.9 g/dL (ref 6.0–8.3)

## 2017-11-20 LAB — CBC WITH DIFFERENTIAL/PLATELET
BASOS ABS: 0 10*3/uL (ref 0.0–0.1)
Basophils Relative: 0.4 % (ref 0.0–3.0)
Eosinophils Absolute: 0.2 10*3/uL (ref 0.0–0.7)
Eosinophils Relative: 3 % (ref 0.0–5.0)
HCT: 39.3 % (ref 36.0–46.0)
HEMOGLOBIN: 12.9 g/dL (ref 12.0–15.0)
Lymphocytes Relative: 31.1 % (ref 12.0–46.0)
Lymphs Abs: 2 10*3/uL (ref 0.7–4.0)
MCHC: 32.9 g/dL (ref 30.0–36.0)
MCV: 92.7 fl (ref 78.0–100.0)
Monocytes Absolute: 0.7 10*3/uL (ref 0.1–1.0)
Monocytes Relative: 11 % (ref 3.0–12.0)
NEUTROS PCT: 54.5 % (ref 43.0–77.0)
Neutro Abs: 3.4 10*3/uL (ref 1.4–7.7)
Platelets: 244 10*3/uL (ref 150.0–400.0)
RBC: 4.24 Mil/uL (ref 3.87–5.11)
RDW: 14.8 % (ref 11.5–15.5)
WBC: 6.3 10*3/uL (ref 4.0–10.5)

## 2017-11-21 ENCOUNTER — Other Ambulatory Visit: Payer: Medicare HMO

## 2017-11-21 DIAGNOSIS — K5180 Other ulcerative colitis without complications: Secondary | ICD-10-CM

## 2017-11-22 LAB — CLOSTRIDIUM DIFFICILE BY PCR: Toxigenic C. Difficile by PCR: NEGATIVE

## 2017-12-07 ENCOUNTER — Telehealth: Payer: Self-pay | Admitting: Gastroenterology

## 2017-12-07 NOTE — Telephone Encounter (Signed)
Yes if she is feeling better can start tapering by 5mg  / week. She can use tylenol for headaches if needed. Thanks

## 2017-12-07 NOTE — Telephone Encounter (Signed)
Patient is feeling "floaty" dizziness and headache from the prednisone. She has not taken any Tylenol to help with the headache as she is afraid to take any pain relief medications. She is wondering if she could start the tapering now instead of waiting the 14 days. Currently on 40 mg, states has been on it 8 days.

## 2017-12-08 NOTE — Telephone Encounter (Signed)
Patient advised of recommendations.  

## 2017-12-20 ENCOUNTER — Telehealth: Payer: Self-pay | Admitting: Gastroenterology

## 2017-12-20 MED ORDER — PREDNISONE 10 MG PO TABS
ORAL_TABLET | ORAL | 0 refills | Status: DC
Start: 2017-12-20 — End: 2018-06-08

## 2017-12-20 NOTE — Telephone Encounter (Signed)
Pt needed 154 tablets to complete 9 weeks of prednisone taper. Original script written for 100 tablets.  Sent additional 54 tablets to finish taper.

## 2018-01-16 ENCOUNTER — Ambulatory Visit: Payer: Medicare HMO | Admitting: Gastroenterology

## 2018-01-16 ENCOUNTER — Encounter: Payer: Self-pay | Admitting: Gastroenterology

## 2018-01-16 VITALS — BP 108/52 | HR 92 | Ht 66.0 in | Wt 134.0 lb

## 2018-01-16 DIAGNOSIS — K5150 Left sided colitis without complications: Secondary | ICD-10-CM

## 2018-01-16 NOTE — Patient Instructions (Addendum)
If you are age 78 or older, your body mass index should be between 23-30. Your Body mass index is 21.63 kg/m. If this is out of the aforementioned range listed, please consider follow up with your Primary Care Provider.  If you are age 66 or younger, your body mass index should be between 19-25. Your Body mass index is 21.63 kg/m. If this is out of the aformentioned range listed, please consider follow up with your Primary Care Provider.   You have been scheduled for a colonoscopy. Please follow written instructions given to you at your visit today.  Please pick up your prep supplies at the pharmacy within the next 1-3 days. If you use inhalers (even only as needed), please bring them with you on the day of your procedure. Your physician has requested that you go to www.startemmi.com and enter the access code given to you at your visit today. This web site gives a general overview about your procedure. However, you should still follow specific instructions given to you by our office regarding your preparation for the procedure.  Please finish your prednisone taper.  As we discussed you will discontinue taking Sulfasalazine.  Thank you for entrusting me with your care and for choosing [facility], Dr. Sarcoxie Cellar

## 2018-01-16 NOTE — Progress Notes (Signed)
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 She has been on prednisone over the years as needed for flares.  Historically had not  needed prednisone for a flare for several years until May 2019. She has been doing well for a long time.  She has never been on anti-TNFs or immunomodulators in the past.  She has been off all medications for a few years at least.   SINCE LAST VISIT  The patient is here for a follow-up visit today. She had been off all medicines for her colitis for a few years. Her last colonoscopy in 2017 showed normal colon without inflammation while being off her sulfasalazine. She reports this year has been very stressful for her. She's had a death of a family member, she developed trigeminal neuralgia several months ago, she her brother has been sick and she has been caring for him. She states she took a medication to treat her trigeminal neuralgia which flared her colitis. She was having increased stool frequency with urgency and passing mucus. She called in for advice and requested prednisone which has worked for her in the past. She was given 40 mg of prednisone and taper by 5 mg per week. She states this quickly resolved her flare. She reports she is having 2 bowel movements per day ithout bleeding or urgency, stool has improved. She denies any abdominal pains. She is currently on prednisone 36m and due to taper to 534mthis weekend.  We discussed long-term land to treat her colitis. She had been doing so well off of all medicines for years, she is hesitant to resume anything for maintenance. She's never been on Lialda before. No family history of colon cancer.  Endoscopic history: EGD 09/21/2015 - benign stricture at the [facility], dilated to 1881molonoscopy 09/21/2015 - normal colon, adenoma x 2(5mm86mno dysplasia - done off all medications    Past Medical History:  Diagnosis Date  . Arthritis   . GERD  (gastroesophageal reflux disease)   . Hyperlipidemia   . Ulcerative colitis      Past Surgical History:  Procedure Laterality Date  . BALLOON DILATION  07/03/2012   Procedure: BALLOON DILATION;  Surgeon: MartGarlan Fair;  [facility];  Service: Endoscopy;  Laterality: N/A;  . BALLOON DILATION N/A 11/12/2013   Procedure: BALLOON DILATION;  Surgeon: MartGarlan Fair;  [facility];  Service: Endoscopy;  Laterality: N/A;  . BREAST BIOPSY  11/01/2011   Procedure: BREAST BIOPSY WITH NEEDLE LOCALIZATION;  Surgeon: HaywAdin Hector;  [facility];  Laterality: Right;  . BREAST LUMPECTOMY  1970   right breast- benign  . COLONOSCOPY WITH PROPOFOL N/A 09/21/2015   Procedure: COLONOSCOPY WITH PROPOFOL;  Surgeon: MartGarlan Fair;  [facility];  Service: Endoscopy;  Laterality: N/A;  . CYSTECTOMY     finger  . ESOPHAGOGASTRODUODENOSCOPY N/A 11/12/2013   Procedure: ESOPHAGOGASTRODUODENOSCOPY (EGD);  Surgeon: MartGarlan Fair;  [facility];  Service: Endoscopy;  Laterality: N/A;  . ESOPHAGOGASTRODUODENOSCOPY (EGD) WITH ESOPHAGEAL DILATION  07/03/2012   Procedure: ESOPHAGOGASTRODUODENOSCOPY (EGD) WITH ESOPHAGEAL DILATION;  Surgeon: MartGarlan Fair;  [facility];  Service: Endoscopy;  Laterality: N/A;  . ESOPHAGOGASTRODUODENOSCOPY (EGD) WITH PROPOFOL N/A 09/21/2015   Procedure: ESOPHAGOGASTRODUODENOSCOPY (EGD) WITH PROPOFOL;  Surgeon: MartGarlan Fair;  [facility];  Service: Endoscopy;  Laterality: N/A;  Family History  Problem Relation Age of Onset  . Heart disease Mother   . Prostate cancer Brother   . Diabetes Brother   . Colon cancer Neg Hx   . Stomach cancer Neg Hx    Social History   Tobacco Use  . Smoking status: Former Smoker    Last attempt to quit: 07/18/1968    Years since quitting: 49.5  . Smokeless tobacco: Never Used  Substance Use Topics  .  Alcohol use: No  . Drug use: No   Current Outpatient Medications  Medication Sig Dispense Refill  . fexofenadine (ALLEGRA) 180 MG tablet Take 180 mg by mouth as needed.     . Loperamide HCl (ANTI-DIARRHEAL PO) Take 1 mg by mouth as needed.     . predniSONE (DELTASONE) 10 MG tablet Take 58m daily for 2 weeks then decrease by 591meach week until off med 54 tablet 0  . ranitidine (ZANTAC) 150 MG tablet Take 150 mg by mouth.    . SULFASALAZINE PO Take 1,000 mg by mouth 2 (two) times daily.      No current facility-administered medications for this visit.    Allergies  Allergen Reactions  . Amoxicillin-Pot Clavulanate Diarrhea  . Codeine Nausea And Vomiting  . Simvastatin Other (See Comments)  . Warfarin Other (See Comments)     Review of Systems: All systems reviewed and negative except where noted in HPI.   Lab Results  Component Value Date   WBC 6.3 11/20/2017   HGB 12.9 11/20/2017   HCT 39.3 11/20/2017   MCV 92.7 11/20/2017   PLT 244.0 11/20/2017    Lab Results  Component Value Date   CREATININE 0.82 11/20/2017   BUN 17 11/20/2017   NA 141 11/20/2017   K 3.9 11/20/2017   CL 103 11/20/2017   CO2 32 11/20/2017   Lab Results  Component Value Date   ALT 6 11/20/2017   AST 15 11/20/2017   ALKPHOS 46 11/20/2017   BILITOT 0.3 11/20/2017     Physical Exam: BP (!) 108/52   Pulse 92   Ht 5' 6"  (1.676 m)   Wt 134 lb (60.8 kg)   BMI 21.63 kg/m  Constitutional: Pleasant,well-developed, female in no acute distress. HEENT: Normocephalic and atraumatic. Conjunctivae are normal. No scleral icterus. Neck supple.  Cardiovascular: Normal rate, regular rhythm.  Pulmonary/chest: Effort normal and breath sounds normal. No wheezing, rales or rhonchi. Abdominal: Soft, nondistended, nontender. There are no masses palpable. No hepatomegaly. Extremities: no edema Lymphadenopathy: No cervical adenopathy noted. Neurological: Alert and oriented to person place and time. Skin:  Skin is warm and dry. No rashes noted. Psychiatric: Normal mood and affect. Behavior is normal.   ASSESSMENT AND PLAN: [age]ear old female here for reassessment of the following issues:  Left-sided ulcerative colitis - as above generally managed with sulfasalazine monotherapy for years with prednisone as needed. She had not required prednisone for several years and had been off all maintenance medications for a few years with a normal colonoscopy at that time. She fortunately had a flare of her colitis in May which is responded quite well to prednisone. We discussed options moving forward. We discussed potentially using Lialda as a maintenance therapy. She strongly wishes to avoid any maintenance medication for her colitis, she feels like she flared due to stress her life and potentially triggered by medication she was on for her trigeminal neuralgia (although she denies NSAIDs), and wishes to monitor after recent course of prednisone. This is reasonable given she  had been doing well for several years prior to this occurring. However I do think a colonoscopy in the next few months is reasonable course of action to assess for active inflammation. If she has active disease and is asymptomatic, this may predict risk for future flares and in that setting would recommend some sort of maintenance therapy. She was agreeable to this and we'll likely do this around September timeframe. In the interim she will taper off of prednisone in contact me if she has any recurrence of symptoms. If she does have recurrence she would be agreeable to a trial of Lialda.  All questions are she agreed with the plan.  Lowrys Cellar, MD [facility]

## 2018-03-12 ENCOUNTER — Encounter: Payer: Self-pay | Admitting: Gastroenterology

## 2018-03-15 ENCOUNTER — Telehealth: Payer: Self-pay | Admitting: Gastroenterology

## 2018-03-15 NOTE — Telephone Encounter (Signed)
A user error has taken place.

## 2018-03-26 ENCOUNTER — Encounter: Payer: Medicare HMO | Admitting: Gastroenterology

## 2018-05-04 ENCOUNTER — Telehealth: Payer: Self-pay | Admitting: Gastroenterology

## 2018-05-04 NOTE — Telephone Encounter (Signed)
Called patient back, unable to leave a voice message.

## 2018-05-07 NOTE — Telephone Encounter (Signed)
Pt returned your call and would like a call back. Pls call her [phone].

## 2018-05-07 NOTE — Telephone Encounter (Signed)
Z pack is okay for her to take, but may be expected to cause diarrhea. She may want to take a probiotic such as Florastore during that time. Otherwise, I think okay to proceed. Thanks

## 2018-05-07 NOTE — Telephone Encounter (Signed)
Patient was prescribed Zpack for URI. Patient called she is concerned about antibiotic causing GI upset and if this is okay for her to take. Only has 5 day course of treatment.

## 2018-05-08 NOTE — Telephone Encounter (Signed)
Patient advised.

## 2018-05-26 ENCOUNTER — Telehealth: Payer: Self-pay | Admitting: Physician Assistant

## 2018-05-29 ENCOUNTER — Telehealth: Payer: Self-pay

## 2018-05-29 NOTE — Telephone Encounter (Signed)
Called patient, she is doing better on the prednisone. She was offered an appointment in December, but she prefers to wait until January to come into the office for a follow up visit. Recall placed, she also has a reminder on her calendar to call our office in Dec to schedule appointment.

## 2018-05-29 NOTE — Telephone Encounter (Signed)
-----   Message from Yetta Flock, MD sent at 05/28/2018  7:36 AM EST ----- Regarding: RE: Alejah, Aristizabal Thanks Amy. Given she's had a few flares in recent months, I think Lialda may be reasonable. Almyra Free I agree with Amy that she should have some steroids right now as she outlined. I would also offer her Lialda 2.4gm / day if she wants to start this now, or see me or APP within a month or so to discuss starting Lialda. Can you help coordinate a follow up for her. Thanks  Richardson Landry  ----- Message ----- From: Leotis Pain Sent: 05/26/2018  11:51 AM EST To: Yetta Flock, MD, Wyatt Haste, RN Subject: Alcus Dad                                 Pt called today - UC flare x 10 days, mucous in stool more frequent BM's -3-4 /day still formed- tinge of blood .Marland Kitchen  Not on any maintenance rx by choice    I started her on prednisone 20 mg daily x 7 days then taper by 5 mg weekly   Didn't want to start Lialda right now - probably should   May need to get her office follow up - she know to call for any worsening of sxs .

## 2018-06-01 NOTE — Telephone Encounter (Signed)
Yes if she has been on prednisone 80m for one week can extend another week and then taper by 523mper week. We can go higher on dosing if she feels she needs it. I think Lialda is the better option long term, she is at risk for recurrent flares without maintenance therapy. Can you help book a office follow up with me if she does not have one yet. Thanks

## 2018-06-01 NOTE — Telephone Encounter (Signed)
Discussed at length with the patient. States she understands her instructions and options. She will continue the prednisone 20 mg for another 7 days. She will call with her progress before decreasing the dosage if she is not improving. She will also need to let us know when she needs additional prednisone in order to complete the taper. Declines an appointment until January due to family obligations and other medical appointments.

## 2018-06-01 NOTE — Telephone Encounter (Signed)
Left sided colitis being treated for a flare. Declined Lialda. She has been on Prednisone 20 mg for 7 days now. She calls with an update that she is having 3 (ish) bowel movements a day that are soft unformed. Blood specks in one bowel movement a day (not in all the bowel movements). She would like her stools to "be more formed." Asks if she can added a dose of Imodium or can she stay at 20 mg of Prednisone a little longer. Thank you

## 2018-06-08 ENCOUNTER — Other Ambulatory Visit: Payer: Self-pay

## 2018-06-08 ENCOUNTER — Telehealth: Payer: Self-pay | Admitting: Gastroenterology

## 2018-06-08 MED ORDER — PREDNISONE 10 MG PO TABS: ORAL_TABLET | ORAL | 0 refills | Status: DC

## 2018-06-08 NOTE — Telephone Encounter (Signed)
Patient wanted to let you know that she has almost completed her Eliquis, finishes on 11/28. She feels that the Eliquis is contributing to her GI symptoms and wants to know if she can continue the prednisone 20 mg daily for one more week then taper down by 5 mg.

## 2018-06-08 NOTE — Telephone Encounter (Signed)
Yes that is fine  Thanks   

## 2018-06-08 NOTE — Telephone Encounter (Signed)
Patient advised, she also requested a refill on the prednisone so that she can complete the taper.

## 2018-06-28 ENCOUNTER — Ambulatory Visit: Payer: Medicare HMO | Admitting: Gastroenterology

## 2018-07-28 ENCOUNTER — Telehealth: Payer: Self-pay | Admitting: Internal Medicine

## 2018-07-28 MED ORDER — PREDNISONE 5 MG PO TABS: ORAL_TABLET | ORAL | 0 refills | Status: DC

## 2018-07-28 NOTE — Telephone Encounter (Signed)
Having a flare of UC with diarrhea -  Was to see Dr. Havery [name] 08/06/22 but her brother died and she needs to leave and cancel + reschedule her appointment  I rxed 10 week prednisone taper as in past and she will call back

## 2018-07-30 ENCOUNTER — Ambulatory Visit: Payer: Medicare HMO | Admitting: Gastroenterology

## 2018-07-30 NOTE — Telephone Encounter (Signed)
Thanks Glendell Docker. Agree. Barbera Setters can you help book her a follow up when she gets back into town? Thanks

## 2018-07-30 NOTE — Telephone Encounter (Signed)
Patient has already rescheduled for 2/7.

## 2018-08-24 ENCOUNTER — Encounter: Payer: Self-pay | Admitting: Gastroenterology

## 2018-08-24 ENCOUNTER — Other Ambulatory Visit (INDEPENDENT_AMBULATORY_CARE_PROVIDER_SITE_OTHER): Payer: Medicare HMO

## 2018-08-24 ENCOUNTER — Ambulatory Visit: Payer: Medicare HMO | Admitting: Gastroenterology

## 2018-08-24 ENCOUNTER — Encounter (INDEPENDENT_AMBULATORY_CARE_PROVIDER_SITE_OTHER): Payer: Self-pay

## 2018-08-24 VITALS — BP 124/54 | HR 80 | Ht 66.0 in | Wt 139.4 lb

## 2018-08-24 DIAGNOSIS — R109 Unspecified abdominal pain: Secondary | ICD-10-CM | POA: Diagnosis not present

## 2018-08-24 DIAGNOSIS — K5150 Left sided colitis without complications: Secondary | ICD-10-CM

## 2018-08-24 DIAGNOSIS — R1011 Right upper quadrant pain: Secondary | ICD-10-CM

## 2018-08-24 LAB — CBC WITH DIFFERENTIAL/PLATELET
Basophils Absolute: 0.1 10*3/uL (ref 0.0–0.1)
Basophils Relative: 0.5 % (ref 0.0–3.0)
EOS ABS: 0 10*3/uL (ref 0.0–0.7)
EOS PCT: 0 % (ref 0.0–5.0)
HEMATOCRIT: 40.8 % (ref 36.0–46.0)
Hemoglobin: 13.3 g/dL (ref 12.0–15.0)
Lymphocytes Relative: 5.5 % — ABNORMAL LOW (ref 12.0–46.0)
Lymphs Abs: 0.8 10*3/uL (ref 0.7–4.0)
MCHC: 32.7 g/dL (ref 30.0–36.0)
MCV: 92.2 fl (ref 78.0–100.0)
Monocytes Absolute: 0.8 10*3/uL (ref 0.1–1.0)
Monocytes Relative: 5.6 % (ref 3.0–12.0)
NEUTROS ABS: 12.7 10*3/uL — AB (ref 1.4–7.7)
Neutrophils Relative %: 88.4 % — ABNORMAL HIGH (ref 43.0–77.0)
PLATELETS: 223 10*3/uL (ref 150.0–400.0)
RBC: 4.42 Mil/uL (ref 3.87–5.11)
RDW: 15.1 % (ref 11.5–15.5)
WBC: 14.3 10*3/uL — ABNORMAL HIGH (ref 4.0–10.5)

## 2018-08-24 LAB — COMPREHENSIVE METABOLIC PANEL
ALBUMIN: 3.8 g/dL (ref 3.5–5.2)
ALT: 8 U/L (ref 0–35)
AST: 10 U/L (ref 0–37)
Alkaline Phosphatase: 52 U/L (ref 39–117)
BUN: 23 mg/dL (ref 6–23)
CALCIUM: 9.3 mg/dL (ref 8.4–10.5)
CHLORIDE: 100 meq/L (ref 96–112)
CO2: 31 mEq/L (ref 19–32)
Creatinine, Ser: 0.9 mg/dL (ref 0.40–1.20)
GFR: 60.48 mL/min (ref >60.00)
Glucose, Bld: 157 mg/dL — ABNORMAL HIGH (ref 70–99)
POTASSIUM: 4 meq/L (ref 3.5–5.1)
SODIUM: 139 meq/L (ref 135–145)
Total Bilirubin: 0.3 mg/dL (ref 0.2–1.2)
Total Protein: 6.6 g/dL (ref 6.0–8.3)

## 2018-08-24 MED ORDER — MESALAMINE 1.2 G PO TBEC: 2.4000 g | DELAYED_RELEASE_TABLET | Freq: Every day | ORAL | 3 refills | Status: DC

## 2018-08-24 NOTE — Progress Notes (Signed)
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 She has been on prednisone over the years as needed for flares.  Historically had not  needed prednisone for a flare for several years until May 2019.She has been doing well for a long time. She has never been on anti-TNFsor immunomodulators in the past. She has been off all medications for a few years at least.  SINCE LAST VISIT  78 year old female here for a follow-up visit for her colitis. History as above, she has not been on any maintenance therapy in recent years. I have not seen her for several months. This past year things have changed and she has had a total of 3 flares of her colitis in the past year. She attributes this to caring for her brother who recently passed away, and multiple life stressors. She's also had a few infections for which she is receiving antibiotics and she thinks that has flared her symptoms. 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   Most recently she had another flare of her colitis with increased frequency of stools and some bleeding. She has since been put back on prednisone 40 mg for a week and is tapering by 5 mg per week. She reports is working quite well. She is having 2 formed bowel movements a day no blood. No pains. Overall doing much better on this regimen. Due to her brother's recent passing she thinks the burden of stress in her life will be much less decreased and she hopes this will help her cope better with her colitis. I have previously discussed putting her on mesalamine however she has not been interested to date.  Of note she otherwise has developed some right flank pain for the past 4 days. Usually feels in the morning and gets better over the course of the day. Is not related to eating at all.  Endoscopic history: EGD  09/21/2015 - benign stricture at the [facility], dilated to 77m Colonoscopy 09/21/2015 - normal colon, adenoma x 2(552m, no dysplasia- done off all medications   Past Medical History:  Diagnosis Date  . Arthritis   . Blood clot in vein    in groin  . GERD (gastroesophageal reflux disease)   . Hyperlipidemia   . Trigeminal neuralgia   . Ulcerative colitis      Past Surgical History:  Procedure Laterality Date  . BALLOON DILATION  07/03/2012   Procedure: BALLOON DILATION;  Surgeon: MaGarlan FairMD;  [facility];  Service: Endoscopy;  Laterality: N/A;  . BALLOON DILATION N/A 11/12/2013   Procedure: BALLOON DILATION;  Surgeon: MaGarlan FairMD;  [facility];  Service: Endoscopy;  Laterality: N/A;  . BREAST BIOPSY  11/01/2011   Procedure: BREAST BIOPSY WITH NEEDLE LOCALIZATION;  Surgeon: HaAdin HectorMD;  [facility] Service: [facility];  Laterality: Right;  . BREAST LUMPECTOMY  1970   right breast- benign  . COLONOSCOPY WITH PROPOFOL N/A 09/21/2015   Procedure: COLONOSCOPY WITH PROPOFOL;  Surgeon: MaGarlan FairMD;  [facility];  Service: Endoscopy;  Laterality: N/A;  . CYSTECTOMY     finger  . ESOPHAGOGASTRODUODENOSCOPY N/A 11/12/2013   Procedure: ESOPHAGOGASTRODUODENOSCOPY (EGD);  Surgeon: MaGarlan FairMD;  [facility];  Service: Endoscopy;  Laterality: N/A;  .  ESOPHAGOGASTRODUODENOSCOPY (EGD) WITH ESOPHAGEAL DILATION  07/03/2012   Procedure: ESOPHAGOGASTRODUODENOSCOPY (EGD) WITH ESOPHAGEAL DILATION;  Surgeon: Garlan Fair, MD;  [facility];  Service: Endoscopy;  Laterality: N/A;  . ESOPHAGOGASTRODUODENOSCOPY (EGD) WITH PROPOFOL N/A 09/21/2015   Procedure: ESOPHAGOGASTRODUODENOSCOPY (EGD) WITH PROPOFOL;  Surgeon: Garlan Fair, MD;  [facility];  Service: Endoscopy;  Laterality: N/A;   Family History  Problem Relation Age of Onset  . Heart disease Mother   . Prostate cancer  Brother   . Diabetes Brother   . Other Brother        unknown cause age 73  . Colon cancer Neg Hx   . Stomach cancer Neg Hx    Social History   Tobacco Use  . Smoking status: Former Smoker    Last attempt to quit: 07/18/1968    Years since quitting: 50.1  . Smokeless tobacco: Never Used  Substance Use Topics  . Alcohol use: No  . Drug use: No   Current Outpatient Medications  Medication Sig Dispense Refill  . Loperamide HCl (ANTI-DIARRHEAL PO) Take 1 mg by mouth as needed.     Marland Kitchen OVER THE COUNTER MEDICATION Take 20 mg by mouth at bedtime. [facility] -Acid Control 20 mg at bedtime    . predniSONE (DELTASONE) 5 MG tablet 40 mg daily x 2 weeks then decrease by 81m a day each week until off 308 tablet 0  . mesalamine (LIALDA) 1.2 g EC tablet Take 2 tablets (2.4 g total) by mouth daily with breakfast. 90 tablet 3   No current facility-administered medications for this visit.    Allergies  Allergen Reactions  . Amoxicillin-Pot Clavulanate Diarrhea  . Codeine Nausea And Vomiting  . Simvastatin Other (See Comments)  . Warfarin Other (See Comments)     Review of Systems: All systems reviewed and negative except where noted in HPI.    Physical Exam: BP (!) 124/54   Pulse 80   Ht 5' 6"  (1.676 m)   Wt 139 lb 7 oz (63.2 kg)   BMI 22.51 kg/m  Constitutional: Pleasant,well-developed, female in no acute distress. HEENT: Normocephalic and atraumatic. Conjunctivae are normal. No scleral icterus. Neck supple.  Cardiovascular: Normal rate, regular rhythm.  Pulmonary/chest: Effort normal and breath sounds normal. No wheezing, rales or rhonchi. Abdominal: Soft, nondistended, Some tenderness to palpation along the right costal margin / rib / flank area.  There are no masses palpable. No hepatomegaly. Extremities: no edema Lymphadenopathy: No cervical adenopathy noted. Neurological: Alert and oriented to person place and time. Skin: Skin is warm and dry. No rashes noted. Psychiatric: Normal  mood and affect. Behavior is normal.   ASSESSMENT AND PLAN: 78year old female here for reassessment of the following issues:  Left sided UC - generally has not been on any maintenance therapy for the past few years and has been doing okay including a normal colonoscopy on her last exam. Unfortunately she's had 3 flares in the past year, she thinks associated with life stressors. Of note she also developed a DVT within the past year during a colitis flare. I discussed how flaring colitis can increase her risk for blood clots. I have in the past recommended mesalamine maintenance therapy for her which she has not tried, she has deferred this in the past. Given her recurrent flares she is amenable to it at this point. I discussed mesalamine with her, we'll try Lialda 2.4 g per day as she continues to taper her prednisone off by 551m/ week. I will  check her baseline labs today as she's not had a blood drawn a while to ensure no anemia and her kidney function is okay. I recommend we plan for a colonoscopy in 3-4 months to assess mucosal healing on mesalamine and perform her surveillance. She agreed with the plan, if mesalamine does not work for her and she flares despite this I asked her to contact me. She agreed  RUQ / flank pain - this is mostly in her flank seems musculoskeletal based on exam. Will check LFTs to ensure normal. Recommend she take some tylenol and monitor. If worsening she should let me know.  [place] Cellar, MD [facility]

## 2018-08-24 NOTE — Patient Instructions (Addendum)
If you are age 79 or older, your body mass index should be between 23-30. Your Body mass index is 22.51 kg/m. If this is out of the aforementioned range listed, please consider follow up with your Primary Care Provider.  If you are age 36 or younger, your body mass index should be between 19-25. Your Body mass index is 22.51 kg/m. If this is out of the aformentioned range listed, please consider follow up with your Primary Care Provider.    Continue your prednisone taper.  We have sent the following medications to your pharmacy for you to pick up at your convenience: Lialda 1.2 g: Take 2 tablets by mouth daily  You will be due for a colonoscopy in 3 to 4 months.  We will contact you about scheduling when it is time.  Please go to the lab in the basement of our building to have lab work done as you leave today. Hit "B" for basement when you get on the elevator.  When the doors open the lab is on your left.  We will call you with the results. Thank you.  Thank you for entrusting me with your care and for choosing [facility], Dr. Atkinson Cellar

## 2018-10-08 ENCOUNTER — Telehealth: Payer: Self-pay

## 2018-10-08 NOTE — Telephone Encounter (Signed)
-----   Message from Yetta Flock, MD sent at 10/05/2018  5:30 PM EDT ----- Regarding: RE: BMET due If she's feeling okay, I think okay to come to the lab for a quick blood draw. Thanks ----- Message ----- From: Roetta Sessions, CMA Sent: 10/03/2018   5:28 PM EDT To: Yetta Flock, MD Subject: FW: BMET due                                   This pt is due for labs.  Would you like for me to ask her to go or push them out for a while? Please advise. Thanks, [name]      ----- Message ----- From: Roetta Sessions, CMA Sent: 10/01/2018 To: Roetta Sessions, CMA Subject: BMET due                                       Pt due for bmet -see result note - Ulcerative colitis

## 2018-10-08 NOTE — Telephone Encounter (Signed)
Spoke to pt.  She and her husband have made the decision to "shelter in place: due to Covid-19.  She will go to the lab when she feels safe.

## 2018-12-06 ENCOUNTER — Telehealth: Payer: Self-pay | Admitting: Gastroenterology

## 2018-12-06 NOTE — Telephone Encounter (Signed)
Ashley Carrillo will you please call this patient?  Not sure why she has not started her Mesalamine which was prescribed in February but it may be more of a triage question if she is now having more symptoms.  Thank you!

## 2018-12-06 NOTE — Telephone Encounter (Signed)
Patient has questions about her medication. Patient has not yet started the med mesalamine. She said that she is having a minor flare up and does not know if she should start the med not or wait until after the minor flare up goes away.

## 2018-12-11 NOTE — Telephone Encounter (Signed)
Called and LM for pt to call back to discuss mesalamine (Lialda) 2.4g daily with breakfast.

## 2018-12-12 NOTE — Telephone Encounter (Signed)
Pt has not started Lialda because of the side effects she read about. Pt reports she finished her prednisone taper in Februaury and within 2 weeks she had a flare. She said she wishes she had listened to Dr. Havery [name] and started the Lialda when she finished prednisone but she was afraid of the side effects she read about. She has had 2 family members (brother and nephew) die recently and she has been very consumed with that. She will go ahead and start the [place] and will call us in a few months when she is ready to schedule her colonoscopy.

## 2018-12-12 NOTE — Telephone Encounter (Signed)
Okay thanks [name] 

## 2019-01-30 ENCOUNTER — Telehealth: Payer: Self-pay | Admitting: Gastroenterology

## 2019-01-30 NOTE — Telephone Encounter (Signed)
Patient reports that she is still having large amounts of gas and semi-formed stools.  She is passing some mucus with the gas "little round balls ( not stool) ".  She reports that she is "not where I need to be". She is reluctant to go back on prednisone but is looking for an OTC alternative.

## 2019-01-30 NOTE — Telephone Encounter (Signed)
Pt called stating that she has been taking mesalamine since the past 3 weeks and it has helped a lot, she is feeling much better and is improving day by day. She wants to know if there is any OTC med that she could take to supplement mesalamine and that would help speed up her healing process. She states that she would not like to go back to prednisone unless it is totally necessary. Pls call her.

## 2019-01-31 NOTE — Telephone Encounter (Signed)
Left message for patient to call back  

## 2019-01-31 NOTE — Telephone Encounter (Signed)
Thanks for the update.  She can increase her lialda to 4.8 gm / day and see if that provides any additional relief. There is no OTC regimen for her colitis. If she is constipated however she can use some Miralax PRN OTC. Otherwise, she is overdue for labs - due for BMET and CBC after starting mesalamine and she has not gone to the lab, if you can ask her to go. She is also due for a colonoscopy, if you can get her scheduled for August, will give a bit more time on higher dose Lialda to see if this puts her in remission. Thanks

## 2019-02-01 ENCOUNTER — Other Ambulatory Visit: Payer: Self-pay

## 2019-02-01 DIAGNOSIS — K5150 Left sided colitis without complications: Secondary | ICD-10-CM

## 2019-02-01 NOTE — Telephone Encounter (Signed)
Called patient and asked her to increase her Mesalamine to 4.8mg  daily. Order in [facility] for CBC and BMET and patient agreed to come in next week for them. Patient says she is not having constipation. Patient not willing to schedule Colonoscopy yet, because her nephew and his girlfriend died of COVID.

## 2019-02-19 ENCOUNTER — Telehealth: Payer: Self-pay | Admitting: Gastroenterology

## 2019-02-19 MED ORDER — MESALAMINE 1.2 G PO TBEC: 4.8000 g | DELAYED_RELEASE_TABLET | Freq: Every day | ORAL | 2 refills | Status: DC

## 2019-02-19 NOTE — Telephone Encounter (Signed)
Per telephone note on 01-30-19 Dr. Havery [name] increased Lialda to 4.8g daily. Sent new script to [facility].  Called Ashley Carrillo and reminded her to go to the lab for Dr. Havery [name]. She apologized that she forgot she was supposed to do labs. Agreed to go.

## 2019-02-19 NOTE — Telephone Encounter (Signed)
Pt reported that Dr. Havery [name] had increased dose of mesalamine and now pharmacy is requesting a new prescription.

## 2019-02-20 ENCOUNTER — Other Ambulatory Visit (INDEPENDENT_AMBULATORY_CARE_PROVIDER_SITE_OTHER): Payer: Medicare HMO

## 2019-02-20 DIAGNOSIS — K5150 Left sided colitis without complications: Secondary | ICD-10-CM

## 2019-02-20 LAB — CBC WITH DIFFERENTIAL/PLATELET
Basophils Absolute: 0 10*3/uL (ref 0.0–0.1)
Basophils Relative: 0.5 % (ref 0.0–3.0)
Eosinophils Absolute: 0.1 10*3/uL (ref 0.0–0.7)
Eosinophils Relative: 1.8 % (ref 0.0–5.0)
HCT: 38.1 % (ref 36.0–46.0)
Hemoglobin: 12.8 g/dL (ref 12.0–15.0)
Lymphocytes Relative: 36.4 % (ref 12.0–46.0)
Lymphs Abs: 2.4 10*3/uL (ref 0.7–4.0)
MCHC: 33.6 g/dL (ref 30.0–36.0)
MCV: 90 fl (ref 78.0–100.0)
Monocytes Absolute: 0.8 10*3/uL (ref 0.1–1.0)
Monocytes Relative: 11.5 % (ref 3.0–12.0)
Neutro Abs: 3.3 10*3/uL (ref 1.4–7.7)
Neutrophils Relative %: 49.8 % (ref 43.0–77.0)
Platelets: 240 10*3/uL (ref 150.0–400.0)
RBC: 4.23 Mil/uL (ref 3.87–5.11)
RDW: 14.8 % (ref 11.5–15.5)
WBC: 6.7 10*3/uL (ref 4.0–10.5)

## 2019-02-20 LAB — BASIC METABOLIC PANEL
BUN: 19 mg/dL (ref 6–23)
CO2: 31 mEq/L (ref 19–32)
Calcium: 9.4 mg/dL (ref 8.4–10.5)
Chloride: 103 mEq/L (ref 96–112)
Creatinine, Ser: 0.76 mg/dL (ref 0.40–1.20)
GFR: 73.42 mL/min (ref >60.00)
Glucose, Bld: 96 mg/dL (ref 70–99)
Potassium: 3.7 mEq/L (ref 3.5–5.1)
Sodium: 139 mEq/L (ref 135–145)

## 2019-03-21 ENCOUNTER — Telehealth: Payer: Self-pay | Admitting: Gastroenterology

## 2019-03-21 ENCOUNTER — Other Ambulatory Visit: Payer: Self-pay

## 2019-03-21 MED ORDER — PREDNISONE 5 MG PO TABS: 40.0000 mg | ORAL_TABLET | Freq: Every day | ORAL | 0 refills | Status: DC

## 2019-03-21 NOTE — Telephone Encounter (Signed)
Patient called and states she is not doing any better on the Mesalamine. She is having 5 loose/semi-formed stools a day with urgency/incontinence. She would like to try the Prednisone again. I asked if she was open to a Colonoscopy yet (with her COVID concerns) and she is not. Please advise

## 2019-03-21 NOTE — Telephone Encounter (Signed)
Sorry to hear it. She can take prednisone 40mg  / day for 2 weeks then taper by 5mg  / week until done. I would like to see her for a follow up visit in the office to discuss long term plan. Thanks

## 2019-03-22 ENCOUNTER — Telehealth: Payer: Self-pay | Admitting: Gastroenterology

## 2019-03-22 ENCOUNTER — Other Ambulatory Visit: Payer: Self-pay | Admitting: General Surgery

## 2019-03-22 MED ORDER — PREDNISONE 5 MG PO TABS: 40.0000 mg | ORAL_TABLET | Freq: Every day | ORAL | 0 refills | Status: DC

## 2019-03-22 NOTE — Telephone Encounter (Signed)
Pt states that her pharmacy could not refill prednisone, she was not given a reason why, she is requesting if we can contact them to ask for a reason.

## 2019-03-22 NOTE — Telephone Encounter (Signed)
Corrected dosing and tablets have been re-sent to the pharmacy by [facility]. Pt aware.

## 2019-03-22 NOTE — Telephone Encounter (Signed)
Order for Prednisone sent to patient's pharmacy. Called patient and gave instructions for taking. Patient already has office visit scheduled with Dr. Havery [name] on 04/15/19

## 2019-03-27 ENCOUNTER — Telehealth: Payer: Self-pay | Admitting: Gastroenterology

## 2019-03-27 NOTE — Telephone Encounter (Signed)
If her higher dosing she means 4.8gm / day I think that is okay. I would continue that and I'll see her for a follow up in a few weeks as scheduled. Thanks

## 2019-03-27 NOTE — Telephone Encounter (Signed)
Called patient back and let her know Dr. Havery [name] is ok with the 4.8mg  daily dose of Mesalamine along with the Prednisone.

## 2019-04-15 ENCOUNTER — Ambulatory Visit: Payer: Medicare HMO | Admitting: Gastroenterology

## 2019-04-15 ENCOUNTER — Encounter: Payer: Self-pay | Admitting: Gastroenterology

## 2019-04-15 ENCOUNTER — Other Ambulatory Visit: Payer: Medicare HMO

## 2019-04-15 VITALS — BP 108/70 | HR 68 | Temp 98.2°F | Ht 66.0 in | Wt 136.0 lb

## 2019-04-15 DIAGNOSIS — K5150 Left sided colitis without complications: Secondary | ICD-10-CM | POA: Diagnosis not present

## 2019-04-15 NOTE — Patient Instructions (Signed)
Continue Prednisone taper as directed.   Please have lab work done in 4 weeks.   We will send you a reminder in January to schedule your colonoscopy.    Thank you for trusting me with your gastrointestinal care!     Cellar, MD

## 2019-04-15 NOTE — Progress Notes (Signed)
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 She has been on prednisone over the years as needed for flares. Historically had notneeded prednisone for a flare for several years until May 2019.She has been doing well for a long time. She has never been on anti-TNFsor immunomodulators in the past. She has been off all medications for a few years at least.  SINCE LAST VISIT  78 year old female here for follow-up visit.  History as outlined above, she is not been on any maintenance therapy for her colitis until recently.  She has had several life stressors including taking care of her brother who is quite ill and eventually passed away, she is also had a nephew who died of coronavirus in his 42s which was quite stressful to her family.  She thinks the life stressors have made her colitis much more difficult to control in the past year or so.  She has had 3 courses of prednisone in 2019 and another course more recently this year, currently tapering down on 30 mg a day.  She was on Lialda monotherapy and was compliant with this at 4 tabs a day, she just does not think that this has kept her colitis in remission.  She has responded pretty well to prednisone when she is needed it.  She is currently doing much better after having completed 40 mg of prednisone for 2 weeks and then a slow taper.  She is having about 3 bowel movements a day.  No blood in her stools.  No abdominal pains.  Not much urgency.  She otherwise has trigeminal neuralgia which bothers her quite significantly.  We have talked about doing a colonoscopy to reassess her colon, she has not been able to do that recently.  She is concerned about long-term regimens for her colitis and wants to avoid biologic therapies if possible.  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 Unfortunately she's had 3 flares in the past year, she thinks associated with life stressors. Of note she also developed a DVT within the past year during a colitis flare. I discussed how flaring colitis can increase her risk for blood clots. I have in the past recommended mesalamine maintenance therapy for her which she has not tried, she has deferred this in the past. Given her recurrent flares she is amenable to it at this point. I discussed mesalamine with her, we'll try Lialda 2.4 g per day as she continues to taper her prednisone off by 77m74m week. I will check her baseline labs today as she's not had a blood drawn a while to ensure no anemia and her kidney function is okay. I recommend we plan for a colonoscopy in 3-4 months to assess mucosal healing on mesalamine and perform her surveillance. She agreed with the plan, if mesalamine does not work for her and she flares despite this I asked her to contact me. She agreed         Past Medical History:  Diagnosis Date   Arthritis    Blood clot in vein    in groin  GERD (gastroesophageal reflux disease)    Hyperlipidemia    Trigeminal neuralgia    Ulcerative colitis      Past Surgical History:  Procedure Laterality Date   BALLOON DILATION  07/03/2012   Procedure: BALLOON DILATION;  Surgeon: Garlan Fair, MD;  Location: [facility];  Service: Endoscopy;  Laterality: N/A;   BALLOON DILATION N/A 11/12/2013   Procedure: BALLOON DILATION;  Surgeon: Garlan Fair, MD;  Location: [facility];  Service: Endoscopy;  Laterality: N/A;   BREAST BIOPSY  11/01/2011   Procedure: BREAST BIOPSY WITH NEEDLE LOCALIZATION;  Surgeon: Adin Hector, MD;  Location: [place];  Service: General;  Laterality: Right;   BREAST LUMPECTOMY  1970   right breast- benign   COLONOSCOPY WITH PROPOFOL N/A 09/21/2015   Procedure: COLONOSCOPY WITH PROPOFOL;  Surgeon: Garlan Fair, MD;  Location: [facility];  Service: Endoscopy;  Laterality: N/A;   CYSTECTOMY     finger   ESOPHAGOGASTRODUODENOSCOPY N/A 11/12/2013   Procedure: ESOPHAGOGASTRODUODENOSCOPY (EGD);  Surgeon: Garlan Fair, MD;  Location: [facility];  Service: Endoscopy;  Laterality: N/A;   ESOPHAGOGASTRODUODENOSCOPY (EGD) WITH ESOPHAGEAL DILATION  07/03/2012   Procedure: ESOPHAGOGASTRODUODENOSCOPY (EGD) WITH ESOPHAGEAL DILATION;  Surgeon: Garlan Fair, MD;  [facility]: [facility];  Service: Endoscopy;  Laterality: N/A;   ESOPHAGOGASTRODUODENOSCOPY (EGD) WITH PROPOFOL N/A 09/21/2015   Procedure: ESOPHAGOGASTRODUODENOSCOPY (EGD) WITH PROPOFOL;  Surgeon: Garlan Fair, MD;  Location: [facility];  Service: Endoscopy;  Laterality: N/A;   Family History  Problem Relation Age of Onset   Heart disease Mother    Prostate cancer Brother    Diabetes Brother    Other Brother        unknown cause age 73   Colon cancer Neg Hx    Stomach cancer Neg Hx    Social History   Tobacco Use   Smoking status: Former Smoker    Quit date: 07/18/1968    Years since quitting: 50.7   Smokeless tobacco: Never Used  Substance Use Topics   Alcohol use: No   Drug use: No   Current Outpatient Medications  Medication Sig Dispense Refill   Loperamide HCl (ANTI-DIARRHEAL PO) Take 1 mg by mouth as needed.      mesalamine (LIALDA) 1.2 g EC tablet Take 4 tablets (4.8 g total) by mouth daily with breakfast. 120 tablet 2   OVER THE COUNTER MEDICATION Take 20 mg by mouth at bedtime. [facility] -Acid Control 20 mg at bedtime     predniSONE (DELTASONE) 5 MG tablet Take 8 tablets (40 mg total) by mouth daily with breakfast. Take 40 mg daily for 2 weeks; then taper by 5 mg per week until done 308 tablet 0     No current facility-administered medications for this visit.    Allergies  Allergen Reactions   Amoxicillin-Pot Clavulanate Diarrhea   Codeine Nausea And Vomiting   Simvastatin Other (See Comments)   Warfarin Other (See Comments)     Review of Systems: All systems reviewed and negative except where noted in HPI.    No results found.  Physical Exam: BP 108/70    Pulse 68    Temp 98.2 F (36.8 C)    Ht 5' 6"  (1.676 m)    Wt 136 lb (61.7 kg)    BMI 21.95 kg/m  Constitutional: Pleasant,well-developed, female in no acute distress. HEENT: Normocephalic and atraumatic. Conjunctivae are normal. No scleral icterus. Neck supple.  Cardiovascular: Normal rate, regular rhythm.  Pulmonary/chest: Effort normal and  breath sounds normal. No wheezing, rales or rhonchi. Abdominal: Soft, nondistended, nontender. There are no masses palpable. No hepatomegaly. Extremities: no edema Lymphadenopathy: No cervical adenopathy noted. Neurological: Alert and oriented to person place and time. Skin: Skin is warm and dry. No rashes noted. Psychiatric: Normal mood and affect. Behavior is normal.   ASSESSMENT AND PLAN: 78 year old female here for reassessment of the following:  Left sided UC - generally has not been on any maintenance therapy for the past few years and had been doing okay including a normal colonoscopy on her last exam. Unfortunately she's had 3 flares in 2019 and yet another more recently this year. She is convinced this is due to recent life stressors which have been significant but she thinks managing this better recently.  Of note she also developed a DVT within the past year during a colitis flare.  She unfortunately has failed Lialda monotherapy. We discussed options moving forward.  Given her multitude of flares over the past year and a half I think she may benefit from biologic therapy.  We discussed what these options were in detail.  Her hope is that now that her life stressors  are under better control that her colitis will likewise not bother her as much.  I am not confident that will be the case however she is declining escalation in therapy at this time, prefers to taper down prednisone slowly by 5 mg a week until done and then see how she does.  I am recommending a colonoscopy to reevaluate her colon and assess for level of inflammation in the upcoming weeks.  She does not think she will be able to do that due to her ongoing issues with trigeminal neuralgia right now, her preference would be to do this after the new year.  She was agreeable to submit a fecal calprotectin when she is near the end of her prednisone taper.  Her recent labs otherwise looked okay and she has no anemia. Explained to her that ideally we would like to avoid escalating her regimen to biologic therapy if possible however I am concerned for her recurrent flares and potential need for hospitalization.  If she continues to require additional courses of steroids she will reconsider biologic therapies.  In this situation she would be most agreeable to [facility] given most favorable side effect profile compared to other biologics.  I would like to see her again in a few months for reassessment and for colonoscopy. She agreed. She declined flu shot today, will plan on getting it soon.  Maloy Cellar, MD [facility]

## 2019-05-17 ENCOUNTER — Other Ambulatory Visit: Payer: Medicare HMO

## 2019-05-17 DIAGNOSIS — K5150 Left sided colitis without complications: Secondary | ICD-10-CM

## 2019-05-20 LAB — CALPROTECTIN, FECAL: Calprotectin, Fecal: 29 ug/g (ref 0–120)

## 2019-05-24 ENCOUNTER — Other Ambulatory Visit: Payer: Self-pay | Admitting: Gastroenterology

## 2019-05-24 NOTE — Telephone Encounter (Signed)
Called pt. She is requesting refill until her appt next month with Dr. Havery [name]. 30 day supply sent

## 2019-07-05 ENCOUNTER — Other Ambulatory Visit: Payer: Self-pay | Admitting: Gastroenterology

## 2019-07-08 ENCOUNTER — Ambulatory Visit: Payer: Medicare HMO | Admitting: Gastroenterology

## 2019-07-08 ENCOUNTER — Encounter: Payer: Self-pay | Admitting: Gastroenterology

## 2019-07-08 ENCOUNTER — Other Ambulatory Visit (INDEPENDENT_AMBULATORY_CARE_PROVIDER_SITE_OTHER): Payer: Medicare HMO

## 2019-07-08 VITALS — BP 120/58 | HR 80 | Temp 97.6°F | Ht 66.0 in | Wt 136.8 lb

## 2019-07-08 DIAGNOSIS — K5130 Ulcerative (chronic) rectosigmoiditis without complications: Secondary | ICD-10-CM

## 2019-07-08 DIAGNOSIS — R5383 Other fatigue: Secondary | ICD-10-CM | POA: Diagnosis not present

## 2019-07-08 LAB — COMPREHENSIVE METABOLIC PANEL
ALT: 5 U/L (ref 0–35)
AST: 14 U/L (ref 0–37)
Albumin: 4.1 g/dL (ref 3.5–5.2)
Alkaline Phosphatase: 58 U/L (ref 39–117)
BUN: 21 mg/dL (ref 6–23)
CO2: 30 mEq/L (ref 19–32)
Calcium: 9.7 mg/dL (ref 8.4–10.5)
Chloride: 103 mEq/L (ref 96–112)
Creatinine, Ser: 0.81 mg/dL (ref 0.40–1.20)
GFR: 68.15 mL/min (ref >60.00)
Glucose, Bld: 106 mg/dL — ABNORMAL HIGH (ref 70–99)
Potassium: 4 mEq/L (ref 3.5–5.1)
Sodium: 141 mEq/L (ref 135–145)
Total Bilirubin: 0.3 mg/dL (ref 0.2–1.2)
Total Protein: 6.7 g/dL (ref 6.0–8.3)

## 2019-07-08 LAB — CBC WITH DIFFERENTIAL/PLATELET
Basophils Absolute: 0.1 10*3/uL (ref 0.0–0.1)
Basophils Relative: 0.7 % (ref 0.0–3.0)
Eosinophils Absolute: 0.2 10*3/uL (ref 0.0–0.7)
Eosinophils Relative: 2.9 % (ref 0.0–5.0)
HCT: 39 % (ref 36.0–46.0)
Hemoglobin: 13.2 g/dL (ref 12.0–15.0)
Lymphocytes Relative: 30.9 % (ref 12.0–46.0)
Lymphs Abs: 2.4 10*3/uL (ref 0.7–4.0)
MCHC: 33.8 g/dL (ref 30.0–36.0)
MCV: 92.4 fl (ref 78.0–100.0)
Monocytes Absolute: 0.8 10*3/uL (ref 0.1–1.0)
Monocytes Relative: 10.8 % (ref 3.0–12.0)
Neutro Abs: 4.3 10*3/uL (ref 1.4–7.7)
Neutrophils Relative %: 54.7 % (ref 43.0–77.0)
Platelets: 261 10*3/uL (ref 150.0–400.0)
RBC: 4.21 Mil/uL (ref 3.87–5.11)
RDW: 14.7 % (ref 11.5–15.5)
WBC: 7.8 10*3/uL (ref 4.0–10.5)

## 2019-07-08 LAB — IBC + FERRITIN
Ferritin: 77.5 ng/mL (ref 10.0–291.0)
Iron: 66 ug/dL (ref 42–145)
Saturation Ratios: 21 % (ref 20.0–50.0)
Transferrin: 225 mg/dL (ref 212.0–360.0)

## 2019-07-08 LAB — TSH: TSH: 1.77 u[IU]/mL (ref 0.35–4.50)

## 2019-07-08 NOTE — Patient Instructions (Signed)
If you are age 79 or older, your body mass index should be between 23-30. Your Body mass index is 22.08 kg/m. If this is out of the aforementioned range listed, please consider follow up with your Primary Care Provider.  If you are age 56 or younger, your body mass index should be between 19-25. Your Body mass index is 22.08 kg/m. If this is out of the aformentioned range listed, please consider follow up with your Primary Care Provider.   Your provider has requested that you go to the basement level for lab work before leaving today. Press "B" on the elevator. The lab is located at the first door on the left as you exit the elevator.

## 2019-07-08 NOTE — Progress Notes (Signed)
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 She has been on prednisone over the years as needed for flares. Historically had notneeded prednisone for a flare for several years until May 2019.She has been doing well for a long time. She has never been on anti-TNFsor immunomodulators in the past. She has been off all medications for a few years at least.   SINCE LAST VISIT 78 year old female here for follow-up visit.  She has been doing pretty well since her last visit.  She had 3 courses of prednisone in 2019 and another course a few months ago this year for colitis flares.  She was on Lialda monotherapy when this happened and we did not think this was keeping her in remission.  She feels strongly that these flares were related to stresses in her life.  We have recommended a colonoscopy to assess disease activity given her last exam was in 2017 and normal at the time without any active inflammation.  She had 2 small polyps on that exam.  She has been hesitant to do the colonoscopy due to her coronavirus pandemic concerns.  We have discussed this and she still remains hesitant to perform any colonoscopy at this time.  She also did not want to escalate medical therapy of her colitis either.   She has been taking Lialda 4 tabs a day since have last seen her and she is completed the steroid taper and doing pretty well.  She denies any blood in her stools.  No mucus in her stools.  No abdominal pains.  Her stools are mostly formed, she has 3 regular bowel movements a day.  She has some rare urgency at times but otherwise has no symptoms and feels like things are pretty well controlled.  Of note she had a fecal calprotectin on October 30 which was normal at 43.  She endorses fatigue and feeling tired more so than usual.  She asks what this could be due to.  She also has pain in her neck, her shoulders, and lower back, she takes Tylenol as  needed for this.  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 Arthritis   . Blood clot in vein    in groin  . GERD (gastroesophageal reflux disease)   . Hyperlipidemia   . Trigeminal neuralgia   . Ulcerative colitis      Past Surgical History:  Procedure Laterality Date  . BALLOON DILATION  07/03/2012   Procedure: BALLOON DILATION;  Surgeon: MaGarlan FairMD;  [facility];  Service: Endoscopy;  Laterality: N/A;  . BALLOON DILATION N/A 11/12/2013   Procedure: BALLOON DILATION;  Surgeon: MaGarlan FairMD;  [facility];  Service: Endoscopy;  Laterality: N/A;  . BREAST BIOPSY  11/01/2011   Procedure: BREAST BIOPSY WITH NEEDLE LOCALIZATION;  Surgeon: HaAdin HectorMD;  [facility] [facility] Service: [facility];  Laterality: Right;  . BREAST LUMPECTOMY  1970   right breast- benign  . COLONOSCOPY WITH PROPOFOL N/A 09/21/2015   Procedure: COLONOSCOPY WITH PROPOFOL;  Surgeon: MaGarlan FairMD;  Location[facility];  Service: Endoscopy;  Laterality: N/A;  . CYSTECTOMY     finger  . ESOPHAGOGASTRODUODENOSCOPY N/A 11/12/2013   Procedure:  ESOPHAGOGASTRODUODENOSCOPY (EGD);  Surgeon: Garlan Fair, MD;  Location[facility];  Service: Endoscopy;  Laterality: N/A;  . ESOPHAGOGASTRODUODENOSCOPY (EGD) WITH ESOPHAGEAL DILATION  07/03/2012   Procedure: ESOPHAGOGASTRODUODENOSCOPY (EGD) WITH ESOPHAGEAL DILATION;  Surgeon: Garlan Fair, MD;  [facility];  Service: Endoscopy;  Laterality: N/A;  . ESOPHAGOGASTRODUODENOSCOPY (EGD) WITH PROPOFOL N/A 09/21/2015   Procedure: ESOPHAGOGASTRODUODENOSCOPY (EGD) WITH PROPOFOL;  Surgeon: Garlan Fair, MD;  Location: [facility];  Service: Endoscopy;  Laterality: N/A;   Family History   Problem Relation Age of Onset  . Heart disease Mother   . Prostate cancer Brother   . Diabetes Brother   . Other Brother        unknown cause age 75  . Colon cancer Neg Hx   . Stomach cancer Neg Hx    Social History   Tobacco Use  . Smoking status: Former Smoker    Quit date: 07/18/1968    Years since quitting: 51.0  . Smokeless tobacco: Never Used  Substance Use Topics  . Alcohol use: No  . Drug use: No   Current Outpatient Medications  Medication Sig Dispense Refill  . mesalamine (LIALDA) 1.2 g EC tablet Take 4.8 g by mouth daily with breakfast.    . OVER THE COUNTER MEDICATION Take 20 mg by mouth at bedtime. [facility] -Acid Control 20 mg at bedtime    . Loperamide HCl (ANTI-DIARRHEAL PO) Take 1 mg by mouth as needed.      No current facility-administered medications for this visit.   Allergies  Allergen Reactions  . Amoxicillin-Pot Clavulanate Diarrhea  . Codeine Nausea And Vomiting  . Simvastatin Other (See Comments)  . Warfarin Other (See Comments)     Review of Systems: All systems reviewed and negative except where noted in HPI.    No results found.  Physical Exam: BP (!) 120/58   Pulse 80   Temp 97.6 F (36.4 C)   Ht 5' 6"  (1.676 m)   Wt 136 lb 12.8 oz (62.1 kg)   BMI 22.08 kg/m  Constitutional: Pleasant,well-developed, female in no acute distress. HEENT: Normocephalic and atraumatic. Conjunctivae are normal. No scleral icterus. Neck supple.  Cardiovascular: Normal rate, regular rhythm.  Pulmonary/chest: Effort normal and breath sounds normal. No wheezing, rales or rhonchi. Abdominal: Soft, nondistended, nontender.There are no masses palpable. No hepatomegaly. Extremities: no edema Lymphadenopathy: No cervical adenopathy noted. Neurological: Alert and oriented to person place and time. Skin: Skin is warm and dry. No rashes noted. Psychiatric: Normal mood and affect. Behavior is normal.   ASSESSMENT AND PLAN: 78 year old female here for reassessment  of the following:  Left sided UC / Fatigue- has not been on any maintenance therapy for the past few years, normal colonoscopy on her last exam in 2017. Unfortunately she's had 3 flares in 2019 and yet another more recently this year. She is convinced this is due to recent life stressors which have been significant.  Unclear if she has underlying IBS driving the symptoms versus active colitis.  She has responded to steroids fairly reliably, although most recently had a fecal calprotectin which was normal.  I have previously recommended a colonoscopy to clarify how active her colitis is and make recommendations on long-term regimen.  If her ulcerative colitis is active then mesalamine is clearly not providing good therapy and keeping her in remission.  That being said she is also hesitant to escalate to biologic therapy, and may not do this regardless of disease activity.  I again discussed colonoscopy  with her and recommended this as a way to reassess her disease activity.  She is agreeable to do this after discussion of risks and benefits, however has significant concerns about the coronavirus pandemic.  We discussed the measures we were taking in our endoscopy unit to include testing and making sure she is safe during the exam.  She is not comfortable proceeding yet, hopefully in the next few months she may be more willing.  Given her fatigue I am going to check her blood counts today along with c-Met, TSH, and iron studies.  She will continue her Lialda for now, if she has another flare of symptoms in the interim I asked her to contact me.  She agreed, all questions answered,   Chapman Cellar, MD [facility]

## 2019-07-31 ENCOUNTER — Other Ambulatory Visit: Payer: Self-pay | Admitting: Gastroenterology

## 2019-08-05 ENCOUNTER — Ambulatory Visit: Payer: Medicare HMO

## 2019-08-06 ENCOUNTER — Ambulatory Visit: Payer: Medicare Other | Attending: Internal Medicine

## 2019-08-06 DIAGNOSIS — Z23 Encounter for immunization: Secondary | ICD-10-CM | POA: Insufficient documentation

## 2019-08-06 NOTE — Progress Notes (Signed)
   Covid-19 Vaccination Clinic  Name:  VIANN NIELSON    MRN: [id number] DOB: 1940-08-17  08/06/2019  Ms. Losier was observed post Covid-19 immunization for 30 minutes based on pre-vaccination screening without incidence. She was provided with Vaccine Information Sheet and instruction to access the V-Safe system.   Ms. Muckle was instructed to call 911 with any severe reactions post vaccine: Marland Kitchen Difficulty breathing  . Swelling of your face and throat  . A fast heartbeat  . A bad rash all over your body  . Dizziness and weakness    Immunizations Administered    Name Date Dose VIS Date Route   Pfizer COVID-19 Vaccine 08/06/2019  2:19 PM 0.3 mL 06/28/2019 Intramuscular   Manufacturer: [employer]   Lot: [id number]   [place]: [id number]

## 2019-08-26 ENCOUNTER — Ambulatory Visit: Payer: Medicare HMO | Attending: Internal Medicine

## 2019-08-26 DIAGNOSIS — Z23 Encounter for immunization: Secondary | ICD-10-CM | POA: Insufficient documentation

## 2019-08-26 NOTE — Progress Notes (Signed)
   Covid-19 Vaccination Clinic  Name:  Ashley Carrillo    MRN: [id number] DOB: 12-Oct-1940  08/26/2019  [name] was observed post Covid-19 immunization for 15 minutes without incidence. She was provided with Vaccine Information Sheet and instruction to access the V-Safe system.   [name] was instructed to call 911 with any severe reactions post vaccine: Marland Kitchen Difficulty breathing  . Swelling of your face and throat  . A fast heartbeat  . A bad rash all over your body  . Dizziness and weakness    Immunizations Administered    Name Date Dose VIS Date Route   Pfizer COVID-19 Vaccine 08/26/2019  3:29 PM 0.3 mL 06/28/2019 Intramuscular   Manufacturer: [place]   Lot: [id number]   [place]: [id number]

## 2020-03-10 ENCOUNTER — Other Ambulatory Visit: Payer: Self-pay | Admitting: Gastroenterology

## 2020-04-02 ENCOUNTER — Other Ambulatory Visit: Payer: Self-pay

## 2020-04-02 ENCOUNTER — Ambulatory Visit: Payer: Medicare HMO | Admitting: Cardiology

## 2020-04-02 ENCOUNTER — Encounter: Payer: Self-pay | Admitting: Cardiology

## 2020-04-02 VITALS — BP 148/63 | HR 74 | Resp 16 | Ht 66.0 in | Wt 136.0 lb

## 2020-04-02 DIAGNOSIS — R42 Dizziness and giddiness: Secondary | ICD-10-CM

## 2020-04-02 DIAGNOSIS — R0989 Other specified symptoms and signs involving the circulatory and respiratory systems: Secondary | ICD-10-CM

## 2020-04-02 DIAGNOSIS — E7800 Pure hypercholesterolemia, unspecified: Secondary | ICD-10-CM

## 2020-04-02 DIAGNOSIS — Z87891 Personal history of nicotine dependence: Secondary | ICD-10-CM

## 2020-04-02 NOTE — Progress Notes (Signed)
Date:  1/61/0960   ID:  Ashley Carrillo, DOB [birth year], MRN [id number]  PCP:  [name], MD  Cardiologist:  Rex Kras, DO, [facility] (established care 04/02/2020)  REASON FOR CONSULT: Left Carotid Bruit.   REQUESTING PHYSICIAN:  [name], MD [place],  [place] [id number]  Chief Complaint  Patient presents with  . Fatigue  . Carotid    left    HPI  Ashley Carrillo is a 79 y.o. female who presents to the office with a chief complaint of " evaluation of carotid bruit." Patient's past medical history and cardiovascular risk factors include: Hx of DVT x2 left leg, untreated hyperlipidemia, postmenopausal female, advanced age.  She is referred to the office at the request of [name], MD for evaluation of left carotid bruit.  Patient states that recently has been experiencing lightheaded and dizziness predominately more noticeable during the morning hours.  When she wakes up.  Patient states that she sits for a while and then the symptoms resolved.  She had gone to her primary care provider office and mentioned this concern and was found to have carotid bruit on examination and was referred to cardiology for further evaluation and management.  Patient denies any known history of coronary artery atherosclerosis.  Patient states that she does have hyperlipidemia but because her good cholesterol was high she was not treated for it.  Patient states in the past that she was on statin therapy but she could not tolerate the medication because of myalgias.  Most recent lipid profile performed in 2016.  Patient denies any vision changes and no prior history of TIA or stroke.  Denies prior history of coronary artery disease, myocardial infarction, congestive heart failure, stroke, transient ischemic attack.  FUNCTIONAL STATUS: No structured exercise program or daily routine.   ALLERGIES: Allergies  Allergen Reactions  . Amoxicillin-Pot Clavulanate Diarrhea  .  Codeine Nausea And Vomiting  . Simvastatin Other (See Comments)  . Warfarin Other (See Comments)    MEDICATION LIST PRIOR TO VISIT: Current Meds  Medication Sig  . cholecalciferol (VITAMIN D3) 25 MCG (1000 UNIT) tablet Take 1,000 Units by mouth daily.  . cyanocobalamin 1000 MCG tablet Take 1,000 mcg by mouth daily.  . hydrocortisone 2.5 % ointment hydrocortisone 2.5 % topical ointment  . mesalamine (LIALDA) 1.2 g EC tablet Take 4 tablets (4.8 g total) by mouth daily with breakfast. You are past due for an office visit.  Please call to schedule an appt as soon as possible: [phone]. Thank you  . OVER THE COUNTER MEDICATION Take 20 mg by mouth at bedtime. [facility] -Acid Control 20 mg at bedtime     PAST MEDICAL HISTORY: Past Medical History:  Diagnosis Date  . Arthritis   . Blood clot in vein    in groin  . GERD (gastroesophageal reflux disease)   . Hyperlipidemia   . Trigeminal neuralgia   . Ulcerative colitis     PAST SURGICAL HISTORY: Past Surgical History:  Procedure Laterality Date  . BALLOON DILATION  07/03/2012   Procedure: BALLOON DILATION;  Surgeon: Garlan Fair, MD;  Location: [facility];  Service: Endoscopy;  Laterality: N/A;  . BALLOON DILATION N/A 11/12/2013   Procedure: BALLOON DILATION;  Surgeon: Garlan Fair, MD;  Location: [facility];  Service: Endoscopy;  Laterality: N/A;  . BREAST BIOPSY  11/01/2011   Procedure: BREAST BIOPSY WITH NEEDLE LOCALIZATION;  Surgeon: Adin Hector, MD;  Location: [facility];  Service: General;  Laterality: Right;  . BREAST LUMPECTOMY  1970   right breast- benign  . COLONOSCOPY WITH PROPOFOL N/A 09/21/2015   Procedure: COLONOSCOPY WITH PROPOFOL;  Surgeon: Garlan Fair, MD;  Location: [facility];  Service: Endoscopy;  Laterality: N/A;  . CYSTECTOMY     finger  . ESOPHAGOGASTRODUODENOSCOPY N/A 11/12/2013   Procedure: ESOPHAGOGASTRODUODENOSCOPY (EGD);  Surgeon: Garlan Fair, MD;  Location: [facility];  Service: Endoscopy;  Laterality: N/A;  . ESOPHAGOGASTRODUODENOSCOPY (EGD) WITH ESOPHAGEAL DILATION  07/03/2012   Procedure: ESOPHAGOGASTRODUODENOSCOPY (EGD) WITH ESOPHAGEAL DILATION;  Surgeon: Garlan Fair, MD;  Location: [facility];  Service: Endoscopy;  Laterality: N/A;  . ESOPHAGOGASTRODUODENOSCOPY (EGD) WITH PROPOFOL N/A 09/21/2015   Procedure: ESOPHAGOGASTRODUODENOSCOPY (EGD) WITH PROPOFOL;  Surgeon: Garlan Fair, MD;  Location: [facility];  Service: Endoscopy;  Laterality: N/A;    FAMILY HISTORY: The patient family history includes Diabetes in her brother; Heart disease in her mother; Other in her brother; Prostate cancer in her brother.  SOCIAL HISTORY:  The patient  reports that she quit smoking about 51 years ago. Her smoking use included cigarettes. She has a 10.00 pack-year smoking history. She has never used smokeless tobacco. She reports that she does not drink alcohol and does not use drugs.  REVIEW OF SYSTEMS: Review of Systems  Constitutional: Negative for chills and fever.  HENT: Negative for hoarse voice and nosebleeds.   Eyes: Negative for discharge, double vision and pain.  Cardiovascular: Negative for chest pain, claudication, dyspnea on exertion, leg swelling, near-syncope, orthopnea, palpitations, paroxysmal nocturnal dyspnea and syncope.  Respiratory: Negative for hemoptysis and shortness of breath.   Musculoskeletal: Negative for muscle cramps and myalgias.  Gastrointestinal: Negative for abdominal pain, constipation, diarrhea, hematemesis, hematochezia, melena, nausea and vomiting.  Neurological: Positive for dizziness and light-headedness.   PHYSICAL EXAM: Vitals with BMI 04/02/2020 07/08/2019 04/15/2019  Height 5' 6"  5' 6"  5' 6"   Weight 136 lbs 136 lbs 13 oz 136 lbs  BMI 21.96 74.25 95.63  Systolic 875 643 329  Diastolic 63 58 70  Pulse 74 80 68   CONSTITUTIONAL: Well-developed and well-nourished. No acute distress.  SKIN: Skin is warm  and dry. No rash noted. No cyanosis. No pallor. No jaundice HEAD: Normocephalic and atraumatic.  EYES: No scleral icterus MOUTH/THROAT: Moist oral membranes.  NECK: No JVD present. No thyromegaly noted.  Bilateral carotid bruits  LYMPHATIC: No visible cervical adenopathy.  CHEST Normal respiratory effort. No intercostal retractions  LUNGS: Auscultation bilaterally.  No stridor. No wheezes. No rales.  CARDIOVASCULAR: Regular, positive S1-S2, no murmurs rubs or gallops appreciated. ABDOMINAL: Nonobese, soft, nontender, nondistended, positive bowel sounds in all 4 quadrants, no apparent ascites.  EXTREMITIES: No peripheral edema  HEMATOLOGIC: No significant bruising NEUROLOGIC: Oriented to person, place, and time. Nonfocal. Normal muscle tone.  PSYCHIATRIC: Normal mood and affect. Normal behavior. Cooperative  CARDIAC DATABASE: EKG: 04/02/2020: Normal sinus rhythm, 75 bpm normal axis, without underlying ischemia or injury pattern.    Echocardiogram: No results found for this or any previous visit from the past 1095 days.   Stress Testing: No results found for this or any previous visit from the past 1095 days.  Heart Catheterization: None  Carotid duplex: None  LABORATORY DATA: CBC Latest Ref Rng & Units 07/08/2019 02/20/2019 08/24/2018  WBC 4.0 - 10.5 K/uL 7.8 6.7 14.3(H)  Hemoglobin 12.0 - 15.0 g/dL 13.2 12.8 13.3  Hematocrit 36 - 46 % 39.0 38.1 40.8  Platelets 150 - 400 K/uL 261.0  240.0 223.0    CMP Latest Ref Rng & Units 07/08/2019 02/20/2019 08/24/2018  Glucose 70 - 99 mg/dL 106(H) 96 157(H)  BUN 6 - 23 mg/dL 21 19 23   Creatinine 0.40 - 1.20 mg/dL 0.81 0.76 0.90  Sodium 135 - 145 mEq/L 141 139 139  Potassium 3.5 - 5.1 mEq/L 4.0 3.7 4.0  Chloride 96 - 112 mEq/L 103 103 100  CO2 19 - 32 mEq/L 30 31 31   Calcium 8.4 - 10.5 mg/dL 9.7 9.4 9.3  Total Protein 6.0 - 8.3 g/dL 6.7 - 6.6  Total Bilirubin 0.2 - 1.2 mg/dL 0.3 - 0.3  Alkaline Phos 39 - 117 U/L 58 - 52  AST 0 - 37 U/L 14  - 10  ALT 0 - 35 U/L 5 - 8    Lipid Panel  No results found for: CHOL, TRIG, HDL, CHOLHDL, VLDL, LDLCALC, LDLDIRECT, LABVLDL  No components found for: NTPROBNP No results for input(s): PROBNP in the last 8760 hours. Recent Labs    07/08/19 1506  TSH 1.77    BMP Recent Labs    07/08/19 1506  NA 141  K 4.0  CL 103  CO2 30  GLUCOSE 106*  BUN 21  CREATININE 0.81  CALCIUM 9.7    HEMOGLOBIN A1C No results found for: HGBA1C, MPG   External Labs: Collected: 03/17/2020 Creatinine 0.77 mg/dL. AST 15, ALT 7 eGFR: 73 mL/min per 1.73 m TSH: 2.266 and free T4 0.9  Lipid profile: Collected: 08/28/2014 Total cholesterol 332, triglycerides 124, HDL 83, LDL 224, non-HDL 249  IMPRESSION:    ICD-10-CM   1. Bilateral carotid bruits  R09.89 EKG 12-Lead    PCV CAROTID DUPLEX (BILATERAL)  2. Lightheadedness  R42   3. Dizziness  R42   4. Former smoker  Z87.891   5. Hypercholesterolemia  E78.00 PCV CAROTID DUPLEX (BILATERAL)    CT CARDIAC SCORING    Lipid Panel With LDL/HDL Ratio    LDL cholesterol, direct    Lipoprotein A (LPA)    Apo A1 + B + Ratio    CMP14+EGFR     RECOMMENDATIONS: Ashley Carrillo is a 79 y.o. female whose past medical history and cardiac risk factors include: Hx of DVT x2 left leg, untreated hyperlipidemia, postmenopausal female, advanced age.  Bilateral carotid bruits:  We will order carotid duplex to evaluate for carotid artery atherosclerosis.  Patient is educated on looking up her symptoms of ipsilateral vision changes and contralateral body weakness.  No prior history of stroke or TIA.  Most recent lipid profile from 2016 reviewed with the patient.  Total cholesterol 332, LDL 224, non-HDL 249.  Check fasting lipid profile and other cholesterol markers as described above  Lightheaded and dizziness: Check orthostatic vital signs.    Hypercholesterolemia:  Repeat fasting lipid profile.  Recommend coronary artery calcification score for  further risk stratification for CAD  Former smoker: Educated on the importance of continued smoking cessation.  FINAL MEDICATION LIST END OF ENCOUNTER: No orders of the defined types were placed in this encounter.    Current Outpatient Medications:  .  cholecalciferol (VITAMIN D3) 25 MCG (1000 UNIT) tablet, Take 1,000 Units by mouth daily., Disp: , Rfl:  .  cyanocobalamin 1000 MCG tablet, Take 1,000 mcg by mouth daily., Disp: , Rfl:  .  hydrocortisone 2.5 % ointment, hydrocortisone 2.5 % topical ointment, Disp: , Rfl:  .  mesalamine (LIALDA) 1.2 g EC tablet, Take 4 tablets (4.8 g total) by mouth daily with breakfast. You are  past due for an office visit.  Please call to schedule an appt as soon as possible: [phone]. Thank you, Disp: 120 tablet, Rfl: 1 .  OVER THE COUNTER MEDICATION, Take 20 mg by mouth at bedtime. [facility] -Acid Control 20 mg at bedtime, Disp: , Rfl:   Orders Placed This Encounter  Procedures  . CT CARDIAC SCORING  . Lipid Panel With LDL/HDL Ratio  . LDL cholesterol, direct  . Lipoprotein A (LPA)  . Apo A1 + B + Ratio  . CMP14+EGFR  . EKG 12-Lead  . PCV CAROTID DUPLEX (BILATERAL)    There are no Patient Instructions on file for this visit.   --Continue cardiac medications as reconciled in final medication list. --Return in about 4 weeks (around 04/30/2020) for Review test results, Lipid. Or sooner if needed. --Continue follow-up with your primary care physician regarding the management of your other chronic comorbid conditions.  Patient's questions and concerns were addressed to her satisfaction. She voices understanding of the instructions provided during this encounter.   This note was created using a voice recognition software as a result there may be grammatical errors inadvertently enclosed that do not reflect the nature of this encounter. Every attempt is made to correct such errors.  Rex Kras, [place], [facility]  Pager: [phone] Office: [phone]

## 2020-04-07 LAB — LIPID PANEL WITH LDL/HDL RATIO
Cholesterol, Total: 310 mg/dL — ABNORMAL HIGH (ref 100–199)
HDL: 81 mg/dL (ref >39)
LDL Chol Calc (NIH): 216 mg/dL — ABNORMAL HIGH (ref 0–99)
LDL/HDL Ratio: 2.7 ratio (ref 0.0–3.2)
Triglycerides: 83 mg/dL (ref 0–149)
VLDL Cholesterol Cal: 13 mg/dL (ref 5–40)

## 2020-04-07 LAB — CMP14+EGFR
ALT: 6 IU/L (ref 0–32)
AST: 15 IU/L (ref 0–40)
Albumin/Globulin Ratio: 2 (ref 1.2–2.2)
Albumin: 4.3 g/dL (ref 3.7–4.7)
Alkaline Phosphatase: 77 IU/L (ref 44–121)
BUN/Creatinine Ratio: 24 (ref 12–28)
BUN: 20 mg/dL (ref 8–27)
Bilirubin Total: <0.2 mg/dL (ref 0.0–1.2)
CO2: 25 mmol/L (ref 20–29)
Calcium: 9.5 mg/dL (ref 8.7–10.3)
Chloride: 104 mmol/L (ref 96–106)
Creatinine, Ser: 0.85 mg/dL (ref 0.57–1.00)
GFR calc Af Amer: 75 mL/min/{1.73_m2} (ref >59)
GFR calc non Af Amer: 65 mL/min/{1.73_m2} (ref >59)
Globulin, Total: 2.1 g/dL (ref 1.5–4.5)
Glucose: 103 mg/dL — ABNORMAL HIGH (ref 65–99)
Potassium: 4.1 mmol/L (ref 3.5–5.2)
Sodium: 141 mmol/L (ref 134–144)
Total Protein: 6.4 g/dL (ref 6.0–8.5)

## 2020-04-07 LAB — LDL CHOLESTEROL, DIRECT: LDL Direct: 211 mg/dL — ABNORMAL HIGH (ref 0–99)

## 2020-04-07 LAB — APO A1 + B + RATIO
Apolipo. B/A-1 Ratio: 0.9 ratio — ABNORMAL HIGH (ref 0.0–0.6)
Apolipoprotein A-1: 173 mg/dL (ref 116–209)
Apolipoprotein B: 156 mg/dL — ABNORMAL HIGH (ref <90)

## 2020-04-07 LAB — LIPOPROTEIN A (LPA): Lipoprotein (a): 144.5 nmol/L — ABNORMAL HIGH (ref <75.0)

## 2020-04-08 ENCOUNTER — Ambulatory Visit: Payer: Medicare HMO

## 2020-04-08 DIAGNOSIS — R0989 Other specified symptoms and signs involving the circulatory and respiratory systems: Secondary | ICD-10-CM

## 2020-04-08 DIAGNOSIS — E7800 Pure hypercholesterolemia, unspecified: Secondary | ICD-10-CM

## 2020-04-08 NOTE — Progress Notes (Signed)
Patient was unavailable; spoke to spouse. Spouse voiced understanding and will pass on the message. Spouse also believes Crestor was the previous medication that pt could not tolerate.

## 2020-04-09 ENCOUNTER — Other Ambulatory Visit: Payer: Self-pay | Admitting: Cardiology

## 2020-04-09 DIAGNOSIS — I6522 Occlusion and stenosis of left carotid artery: Secondary | ICD-10-CM

## 2020-04-09 DIAGNOSIS — R0989 Other specified symptoms and signs involving the circulatory and respiratory systems: Secondary | ICD-10-CM

## 2020-04-10 NOTE — Progress Notes (Signed)
Unable to reach pt. Left vm regarding upcoming appt.

## 2020-04-17 ENCOUNTER — Ambulatory Visit: Payer: Medicare HMO | Admitting: Cardiology

## 2020-04-23 ENCOUNTER — Other Ambulatory Visit: Payer: Self-pay

## 2020-04-23 ENCOUNTER — Encounter: Payer: Self-pay | Admitting: Cardiology

## 2020-04-23 ENCOUNTER — Ambulatory Visit: Payer: Medicare HMO | Admitting: Cardiology

## 2020-04-23 VITALS — BP 129/56 | HR 69 | Resp 16 | Ht 66.0 in | Wt 136.0 lb

## 2020-04-23 DIAGNOSIS — Z87891 Personal history of nicotine dependence: Secondary | ICD-10-CM

## 2020-04-23 DIAGNOSIS — I6523 Occlusion and stenosis of bilateral carotid arteries: Secondary | ICD-10-CM

## 2020-04-23 DIAGNOSIS — E7800 Pure hypercholesterolemia, unspecified: Secondary | ICD-10-CM

## 2020-04-23 NOTE — Progress Notes (Signed)
Date:  75/12/4330   ID:  Ashley Carrillo, DOB [birth year], MRN [id number]  PCP:  [name], MD  Cardiologist:  Rex Kras, DO, [facility] (established care 04/02/2020)  Date: 04/23/20 Last Office Visit: 04/02/2020  Chief Complaint  Patient presents with  . carotid bruits  . Follow-up    4 week   . Results    HPI  Ashley Carrillo is a 79 y.o. female who presents to the office with a chief complaint of " reevaluation of carotid bruit and review test results " Patient's past medical history and cardiovascular risk factors include: Hx of DVT x2 left leg, untreated hyperlipidemia, postmenopausal female, advanced age.  She is referred to the office at the request of [name], MD for evaluation of left carotid bruit.  Since last office visit patient has undergone a carotid duplex to evaluate for carotid atherosclerosis.  Based on the duplex she has less than 50% stenosis in the left internal carotid artery.  Patient also has underlying untreated hyperlipidemia and therefore recheck her lipid profile.  Patient is informed that her total cholesterol is 310, direct LDL is 211, apolipoprotein B is elevated, LP(a) is elevated, and nonHDL is 216.  Patient was recommended to have a coronary artery calcification score as well as part of risk stratification as that she is hesitant to start statin therapy.  Unfortunately, this was not performed as of yet.  FUNCTIONAL STATUS: No structured exercise program or daily routine.   ALLERGIES: Allergies  Allergen Reactions  . Amoxicillin-Pot Clavulanate Diarrhea  . Codeine Nausea And Vomiting  . Simvastatin Other (See Comments)  . Warfarin Other (See Comments)    MEDICATION LIST PRIOR TO VISIT: Current Meds  Medication Sig  . cholecalciferol (VITAMIN D3) 25 MCG (1000 UNIT) tablet Take 1,000 Units by mouth daily.  . cyanocobalamin 1000 MCG tablet Take 1,000 mcg by mouth daily.  . hydrocortisone 2.5 % ointment hydrocortisone 2.5 % topical  ointment  . mesalamine (LIALDA) 1.2 g EC tablet Take 4 tablets (4.8 g total) by mouth daily with breakfast. You are past due for an office visit.  Please call to schedule an appt as soon as possible: [phone]. Thank you  . OVER THE COUNTER MEDICATION Take 20 mg by mouth at bedtime. [facility] -Acid Control 20 mg at bedtime     PAST MEDICAL HISTORY: Past Medical History:  Diagnosis Date  . Arthritis   . Blood clot in vein    in groin  . Carotid artery stenosis, asymptomatic   . GERD (gastroesophageal reflux disease)   . Hyperlipidemia   . Trigeminal neuralgia   . Ulcerative colitis     PAST SURGICAL HISTORY: Past Surgical History:  Procedure Laterality Date  . BALLOON DILATION  07/03/2012   Procedure: BALLOON DILATION;  Surgeon: Garlan Fair, MD;  Location: [facility];  Service: Endoscopy;  Laterality: N/A;  . BALLOON DILATION N/A 11/12/2013   Procedure: BALLOON DILATION;  Surgeon: Garlan Fair, MD;  Location: [facility];  Service: Endoscopy;  Laterality: N/A;  . BREAST BIOPSY  11/01/2011   Procedure: BREAST BIOPSY WITH NEEDLE LOCALIZATION;  Surgeon: Adin Hector, MD;  Location: [place] [facility];  Service: General;  Laterality: Right;  . BREAST LUMPECTOMY  1970   right breast- benign  . COLONOSCOPY WITH PROPOFOL N/A 09/21/2015   Procedure: COLONOSCOPY WITH PROPOFOL;  Surgeon: Garlan Fair, MD;  Location: [facility];  Service: Endoscopy;  Laterality: N/A;  . CYSTECTOMY  finger  . ESOPHAGOGASTRODUODENOSCOPY N/A 11/12/2013   Procedure: ESOPHAGOGASTRODUODENOSCOPY (EGD);  Surgeon: Garlan Fair, MD;  Location: [facility];  Service: Endoscopy;  Laterality: N/A;  . ESOPHAGOGASTRODUODENOSCOPY (EGD) WITH ESOPHAGEAL DILATION  07/03/2012   Procedure: ESOPHAGOGASTRODUODENOSCOPY (EGD) WITH ESOPHAGEAL DILATION;  Surgeon: Garlan Fair, MD;  Location: [facility];  Service: Endoscopy;  Laterality: N/A;  . ESOPHAGOGASTRODUODENOSCOPY (EGD) WITH PROPOFOL N/A  09/21/2015   Procedure: ESOPHAGOGASTRODUODENOSCOPY (EGD) WITH PROPOFOL;  Surgeon: Garlan Fair, MD;  Location: [facility];  Service: Endoscopy;  Laterality: N/A;    FAMILY HISTORY: The patient family history includes Diabetes in her brother; Heart disease in her mother; Other in her brother; Prostate cancer in her brother.  SOCIAL HISTORY:  The patient  reports that she quit smoking about 51 years ago. Her smoking use included cigarettes. She has a 10.00 pack-year smoking history. She has never used smokeless tobacco. She reports that she does not drink alcohol and does not use drugs.  REVIEW OF SYSTEMS: Review of Systems  Constitutional: Negative for chills and fever.  HENT: Negative for hoarse voice and nosebleeds.   Eyes: Negative for discharge, double vision and pain.  Cardiovascular: Negative for chest pain, claudication, dyspnea on exertion, leg swelling, near-syncope, orthopnea, palpitations, paroxysmal nocturnal dyspnea and syncope.  Respiratory: Negative for hemoptysis and shortness of breath.   Musculoskeletal: Negative for muscle cramps and myalgias.  Gastrointestinal: Negative for abdominal pain, constipation, diarrhea, hematemesis, hematochezia, melena, nausea and vomiting.  Neurological: Positive for light-headedness. Negative for dizziness.   PHYSICAL EXAM: Vitals with BMI 04/23/2020 04/02/2020 07/08/2019  Height 5' 6"  5' 6"  5' 6"   Weight 136 lbs 136 lbs 136 lbs 13 oz  BMI 21.96 76.28 31.51  Systolic 761 607 371  Diastolic 56 63 58  Pulse 69 74 80   CONSTITUTIONAL: Well-developed and well-nourished. No acute distress.  SKIN: Skin is warm and dry. No rash noted. No cyanosis. No pallor. No jaundice HEAD: Normocephalic and atraumatic.  EYES: No scleral icterus MOUTH/THROAT: Moist oral membranes.  NECK: No JVD present. No thyromegaly noted.  Bilateral carotid bruits  LYMPHATIC: No visible cervical adenopathy.  CHEST Normal respiratory effort. No intercostal  retractions  LUNGS: Auscultation bilaterally.  No stridor. No wheezes. No rales.  CARDIOVASCULAR: Regular, positive S1-S2, no murmurs rubs or gallops appreciated. ABDOMINAL: Nonobese, soft, nontender, nondistended, positive bowel sounds in all 4 quadrants, no apparent ascites.  EXTREMITIES: No peripheral edema  HEMATOLOGIC: No significant bruising NEUROLOGIC: Oriented to person, place, and time. Nonfocal. Normal muscle tone.  PSYCHIATRIC: Normal mood and affect. Normal behavior. Cooperative  CARDIAC DATABASE: EKG: 04/02/2020: Normal sinus rhythm, 75 bpm normal axis, without underlying ischemia or injury pattern.    Echocardiogram: No results found for this or any previous visit from the past 1095 days.   Stress Testing: No results found for this or any previous visit from the past 1095 days.  Heart Catheterization: None  Carotid artery duplex 04/08/2020:  Stenosis in the right internal carotid artery (1-15%).  Stenosis in the left internal carotid artery (16-49%). Stenosis in the left external carotid artery (<50%).  Antegrade right vertebral artery flow. Antegrade left vertebral artery flow.  Follow up in one year is appropriate if clinically indicated.  LABORATORY DATA: CBC Latest Ref Rng & Units 07/08/2019 02/20/2019 08/24/2018  WBC 4.0 - 10.5 K/uL 7.8 6.7 14.3(H)  Hemoglobin 12.0 - 15.0 g/dL 13.2 12.8 13.3  Hematocrit 36 - 46 % 39.0 38.1 40.8  Platelets 150 - 400 K/uL 261.0 240.0 223.0  CMP Latest Ref Rng & Units 04/06/2020 07/08/2019 02/20/2019  Glucose 65 - 99 mg/dL 103(H) 106(H) 96  BUN 8 - 27 mg/dL 20 21 19   Creatinine 0.57 - 1.00 mg/dL 0.85 0.81 0.76  Sodium 134 - 144 mmol/L 141 141 139  Potassium 3.5 - 5.2 mmol/L 4.1 4.0 3.7  Chloride 96 - 106 mmol/L 104 103 103  CO2 20 - 29 mmol/L 25 30 31   Calcium 8.7 - 10.3 mg/dL 9.5 9.7 9.4  Total Protein 6.0 - 8.5 g/dL 6.4 6.7 -  Total Bilirubin 0.0 - 1.2 mg/dL <0.2 0.3 -  Alkaline Phos 44 - 121 IU/L 77 58 -  AST 0 - 40 IU/L  15 14 -  ALT 0 - 32 IU/L 6 5 -    Lipid Panel     Component Value Date/Time   CHOL 310 (H) 04/06/2020 0954   TRIG 83 04/06/2020 0954   HDL 81 04/06/2020 0954   LDLCALC 216 (H) 04/06/2020 0954   LDLDIRECT 211 (H) 04/06/2020 0954   LABVLDL 13 04/06/2020 0954    No components found for: NTPROBNP No results for input(s): PROBNP in the last 8760 hours. Recent Labs    07/08/19 1506  TSH 1.77    BMP Recent Labs    07/08/19 1506 04/06/20 0954  NA 141 141  K 4.0 4.1  CL 103 104  CO2 30 25  GLUCOSE 106* 103*  BUN 21 20  CREATININE 0.81 0.85  CALCIUM 9.7 9.5  GFRNONAA  --  65  GFRAA  --  75    HEMOGLOBIN A1C No results found for: HGBA1C, MPG   External Labs: Collected: 03/17/2020 Creatinine 0.77 mg/dL. AST 15, ALT 7 eGFR: 73 mL/min per 1.73 m TSH: 2.266 and free T4 0.9  Lipid profile: Collected: 08/28/2014 Total cholesterol 332, triglycerides 124, HDL 83, LDL 224, non-HDL 249  Lab Results  Component Value Date   CHOL 310 (H) 04/06/2020   HDL 81 04/06/2020   LDLCALC 216 (H) 04/06/2020   LDLDIRECT 211 (H) 04/06/2020   TRIG 83 04/06/2020   IMPRESSION:    ICD-10-CM   1. Atherosclerosis of both carotid arteries  I65.23   2. Former smoker  Z87.891   3. Hypercholesterolemia  E78.00      RECOMMENDATIONS: Ashley Carrillo is a 79 y.o. female whose past medical history and cardiac risk factors include: Carotid artery atherosclerosis, Hx of DVT x2 left leg, untreated hyperlipidemia, postmenopausal female, advanced age.  Carotid artery atherosclerosis:  Reviewed the results of the carotid duplex with the patient and her husband.  1 year follow-up study recommended.    Left-sided disease is worse than right.  Patient is educated on symptoms of ipsilateral eye involvement and contralateral body involvement.  Given the carotid duplex results and lipid profile recommended statin therapy.  Patient states that she has been intolerant to Crestor in the past.  I  recommended a different statin drug at a low dose and to titrate to pharmacological therapy based on lipid profile.  We also discussed nonstatin medications such as Zetia, Nexletol, or Nexlizet.  Patient would like to read about these medications prior to considering them.   Aspirin 81 mg p.o. daily also recommended.  Patient states that she has underlying ulcerative colitis and would like to discuss it with her GI physician prior to starting the medication.  Hypercholesterolemia:  Given her lipid profile recommended pharmacological therapy.  We discussed statin drugs, nonstatin drugs as potential options.  Patient states that she will discuss  with her PCP and read more about nonstatin medications prior to committing to pharmacological therapy.  I reiterated the concept of coronary artery calcification scoring as a risk ratification tool.  Patient is considering it.  Patient has not had an echo or a stress test in the past.  Given the fact that she has atherosclerosis in 1 vascular bed and cardiovascular risk factors recommended a baseline ischemic evaluation with an echocardiogram and stress test.  Patient states that she would like to hold off on additional testing for now.  Former smoker: Educated on the importance of continued smoking cessation.  FINAL MEDICATION LIST END OF ENCOUNTER: No orders of the defined types were placed in this encounter.    Current Outpatient Medications:  .  cholecalciferol (VITAMIN D3) 25 MCG (1000 UNIT) tablet, Take 1,000 Units by mouth daily., Disp: , Rfl:  .  cyanocobalamin 1000 MCG tablet, Take 1,000 mcg by mouth daily., Disp: , Rfl:  .  hydrocortisone 2.5 % ointment, hydrocortisone 2.5 % topical ointment, Disp: , Rfl:  .  mesalamine (LIALDA) 1.2 g EC tablet, Take 4 tablets (4.8 g total) by mouth daily with breakfast. You are past due for an office visit.  Please call to schedule an appt as soon as possible: [phone]. Thank you, Disp: 120 tablet, Rfl:  1 .  OVER THE COUNTER MEDICATION, Take 20 mg by mouth at bedtime. [facility] -Acid Control 20 mg at bedtime, Disp: , Rfl:   No orders of the defined types were placed in this encounter.  There are no Patient Instructions on file for this visit.   --Continue cardiac medications as reconciled in final medication list. --Return in about 4 weeks (around 05/21/2020) for Review test results. Or sooner if needed. --Continue follow-up with your primary care physician regarding the management of your other chronic comorbid conditions.  Patient's questions and concerns were addressed to her satisfaction. She voices understanding of the instructions provided during this encounter.   Total time spent: 30 minutes reviewing the test results, reviewing lipid profile, and discussing disease management.  This note was created using a voice recognition software as a result there may be grammatical errors inadvertently enclosed that do not reflect the nature of this encounter. Every attempt is made to correct such errors.  Rex Kras, [place], [facility]  Pager: [phone] Office: [phone]

## 2020-04-28 ENCOUNTER — Other Ambulatory Visit: Payer: Self-pay

## 2020-04-28 DIAGNOSIS — R0989 Other specified symptoms and signs involving the circulatory and respiratory systems: Secondary | ICD-10-CM

## 2020-04-28 DIAGNOSIS — E7800 Pure hypercholesterolemia, unspecified: Secondary | ICD-10-CM

## 2020-04-28 DIAGNOSIS — I6522 Occlusion and stenosis of left carotid artery: Secondary | ICD-10-CM

## 2020-04-30 ENCOUNTER — Ambulatory Visit: Payer: Medicare HMO | Admitting: Cardiology

## 2020-05-11 ENCOUNTER — Telehealth: Payer: Self-pay

## 2020-05-11 NOTE — Progress Notes (Signed)
Send chart to front desk to move appt to a sooner appt

## 2020-05-21 ENCOUNTER — Other Ambulatory Visit: Payer: Self-pay

## 2020-05-21 ENCOUNTER — Ambulatory Visit: Payer: Medicare HMO | Admitting: Cardiology

## 2020-05-21 ENCOUNTER — Encounter: Payer: Self-pay | Admitting: Cardiology

## 2020-05-21 VITALS — BP 121/50 | HR 63 | Resp 16 | Ht 66.0 in | Wt 137.0 lb

## 2020-05-21 DIAGNOSIS — I2510 Atherosclerotic heart disease of native coronary artery without angina pectoris: Secondary | ICD-10-CM

## 2020-05-21 DIAGNOSIS — E7800 Pure hypercholesterolemia, unspecified: Secondary | ICD-10-CM

## 2020-05-21 DIAGNOSIS — Z87891 Personal history of nicotine dependence: Secondary | ICD-10-CM

## 2020-05-21 DIAGNOSIS — I6522 Occlusion and stenosis of left carotid artery: Secondary | ICD-10-CM

## 2020-05-21 DIAGNOSIS — R0989 Other specified symptoms and signs involving the circulatory and respiratory systems: Secondary | ICD-10-CM

## 2020-05-21 MED ORDER — ASPIRIN EC 81 MG PO TBEC: 81.0000 mg | DELAYED_RELEASE_TABLET | Freq: Every day | ORAL | 11 refills | Status: DC

## 2020-05-21 NOTE — Progress Notes (Signed)
Date:  17/01/9389   ID:  Ashley Carrillo, DOB [birth year], MRN [id number]  PCP:  [name], MD  Cardiologist:  Rex Kras, DO, [facility] (established care 04/02/2020)  Date: 05/21/20 Last Office Visit: 04/23/2020  Chief Complaint  Patient presents with  . Follow-up    4 week   . Results    calcium score     HPI  Ashley Carrillo is a 79 y.o. female who presents to the office with a chief complaint of " review calcium score and management." Patient's past medical history and cardiovascular risk factors include: Hx of DVT x2 left leg, untreated hyperlipidemia, carotid artery atherosclerosis, moderate coronary artery calcification, postmenopausal female, advanced age.  She is referred to the office at the request of [name], MD for evaluation of left carotid bruit.  She had a carotid duplex to evaluate for carotid atherosclerosis and was noted to have less than 50% stenosis in the left internal carotid artery.  In addition has untreated hyperlipidemia with most recent cholesterol panel noting total cholesterol is 310, direct LDL is 211, apolipoprotein B is elevated, LP(a) is elevated, and nonHDL is 216. She was against the use of aspirin and statin medications. She does not want statin medications due to potential side effects of muscle ache and memory loss.   At last office visit shared decision was to proceed with obtaining a coronary artery calcium score to see if she has disease processes and other vascular events.  Since then she did undergo coronary artery calcium study which notes a total coronary calcium score of 206 which places her at the 50th-75th percentile for females of similar cohorts.  We discussed the management of moderate coronary artery calcification at today's office visit in the presence of her husband Ashley Carrillo.  FUNCTIONAL STATUS: No structured exercise program or daily routine.   ALLERGIES: Allergies  Allergen Reactions  . Amoxicillin-Pot Clavulanate Diarrhea   . Codeine Nausea And Vomiting  . Simvastatin Other (See Comments)  . Warfarin Other (See Comments)    MEDICATION LIST PRIOR TO VISIT: Current Meds  Medication Sig  . cholecalciferol (VITAMIN D3) 25 MCG (1000 UNIT) tablet Take 1,000 Units by mouth daily.  . cyanocobalamin 1000 MCG tablet Take 1,000 mcg by mouth daily.  . hydrocortisone 2.5 % ointment hydrocortisone 2.5 % topical ointment  . mesalamine (LIALDA) 1.2 g EC tablet Take 4 tablets (4.8 g total) by mouth daily with breakfast. You are past due for an office visit.  Please call to schedule an appt as soon as possible: [phone]. Thank you  . OVER THE COUNTER MEDICATION Take 20 mg by mouth at bedtime. [facility] -Acid Control 20 mg at bedtime     PAST MEDICAL HISTORY: Past Medical History:  Diagnosis Date  . Arthritis   . Blood clot in vein    in groin  . Carotid artery stenosis, asymptomatic   . GERD (gastroesophageal reflux disease)   . Hyperlipidemia   . Trigeminal neuralgia   . Ulcerative colitis     PAST SURGICAL HISTORY: Past Surgical History:  Procedure Laterality Date  . BALLOON DILATION  07/03/2012   Procedure: BALLOON DILATION;  Surgeon: Garlan Fair, MD;  Location: [facility];  Service: Endoscopy;  Laterality: N/A;  . BALLOON DILATION N/A 11/12/2013   Procedure: BALLOON DILATION;  Surgeon: Garlan Fair, MD;  Location: [facility];  Service: Endoscopy;  Laterality: N/A;  . BREAST BIOPSY  11/01/2011   Procedure: BREAST BIOPSY WITH NEEDLE LOCALIZATION;  Surgeon:  Adin Hector, MD;  Location: [place];  Service: General;  Laterality: Right;  . BREAST LUMPECTOMY  1970   right breast- benign  . COLONOSCOPY WITH PROPOFOL N/A 09/21/2015   Procedure: COLONOSCOPY WITH PROPOFOL;  Surgeon: Garlan Fair, MD;  Location: [facility];  Service: Endoscopy;  Laterality: N/A;  . CYSTECTOMY     finger  . ESOPHAGOGASTRODUODENOSCOPY N/A 11/12/2013   Procedure: ESOPHAGOGASTRODUODENOSCOPY (EGD);   Surgeon: Garlan Fair, MD;  Location: [facility];  Service: Endoscopy;  Laterality: N/A;  . ESOPHAGOGASTRODUODENOSCOPY (EGD) WITH ESOPHAGEAL DILATION  07/03/2012   Procedure: ESOPHAGOGASTRODUODENOSCOPY (EGD) WITH ESOPHAGEAL DILATION;  Surgeon: Garlan Fair, MD;  [facility]: [facility];  Service: Endoscopy;  Laterality: N/A;  . ESOPHAGOGASTRODUODENOSCOPY (EGD) WITH PROPOFOL N/A 09/21/2015   Procedure: ESOPHAGOGASTRODUODENOSCOPY (EGD) WITH PROPOFOL;  Surgeon: Garlan Fair, MD;  Location: [facility];  Service: Endoscopy;  Laterality: N/A;    FAMILY HISTORY: The patient family history includes Diabetes in her brother; Heart disease in her mother; Other in her brother; Prostate cancer in her brother.  SOCIAL HISTORY:  The patient  reports that she quit smoking about 51 years ago. Her smoking use included cigarettes. She has a 10.00 pack-year smoking history. She has never used smokeless tobacco. She reports that she does not drink alcohol and does not use drugs.  REVIEW OF SYSTEMS: Review of Systems  Constitutional: Negative for chills and fever.  HENT: Negative for hoarse voice and nosebleeds.   Eyes: Negative for discharge, double vision and pain.  Cardiovascular: Negative for chest pain, claudication, dyspnea on exertion, leg swelling, near-syncope, orthopnea, palpitations, paroxysmal nocturnal dyspnea and syncope.  Respiratory: Negative for hemoptysis and shortness of breath.   Musculoskeletal: Negative for muscle cramps and myalgias.  Gastrointestinal: Negative for abdominal pain, constipation, diarrhea, hematemesis, hematochezia, melena, nausea and vomiting.  Neurological: Negative for dizziness and light-headedness.   PHYSICAL EXAM: Vitals with BMI 05/21/2020 04/23/2020 04/02/2020  Height 5' 6"  5' 6"  5' 6"   Weight 137 lbs 136 lbs 136 lbs  BMI 22.12 67.12 45.80  Systolic 998 338 250  Diastolic 50 56 63  Pulse 63 69 74   CONSTITUTIONAL: Well-developed and well-nourished.  No acute distress.  SKIN: Skin is warm and dry. No rash noted. No cyanosis. No pallor. No jaundice HEAD: Normocephalic and atraumatic.  EYES: No scleral icterus MOUTH/THROAT: Moist oral membranes.  NECK: No JVD present. No thyromegaly noted.  Bilateral carotid bruits  LYMPHATIC: No visible cervical adenopathy.  CHEST Normal respiratory effort. No intercostal retractions  LUNGS: Auscultation bilaterally.  No stridor. No wheezes. No rales.  CARDIOVASCULAR: Regular, positive S1-S2, no murmurs rubs or gallops appreciated. ABDOMINAL: Nonobese, soft, nontender, nondistended, positive bowel sounds in all 4 quadrants, no apparent ascites.  EXTREMITIES: No peripheral edema  HEMATOLOGIC: No significant bruising NEUROLOGIC: Oriented to person, place, and time. Nonfocal. Normal muscle tone.  PSYCHIATRIC: Normal mood and affect. Normal behavior. Cooperative  CARDIAC DATABASE: EKG: 04/02/2020: Normal sinus rhythm, 75 bpm normal axis, without underlying ischemia or injury pattern.    Echocardiogram: No results found for this or any previous visit from the past 1095 days.   Stress Testing: No results found for this or any previous visit from the past 1095 days.  Heart Catheterization: None  Coronary artery calcification scoring performed on 04/24/2020 at [facility]:  Left main: 75  Left anterior descending: 115  Left circumflex: 16  Right coronary artery: 0  Total calcium score: 206 AU, is between the 50th and 75th percentile for  females above the age of 87. Compatible with at least moderate atherosclerotic plaque.  Carotid artery duplex 04/08/2020:  Stenosis in the right internal carotid artery (1-15%).  Stenosis in the left internal carotid artery (16-49%). Stenosis in the left external carotid artery (<50%).  Antegrade right vertebral artery flow. Antegrade left vertebral artery flow.  Follow up in one year is appropriate if clinically indicated.  LABORATORY DATA: CBC Latest Ref Rng  & Units 07/08/2019 02/20/2019 08/24/2018  WBC 4.0 - 10.5 K/uL 7.8 6.7 14.3(H)  Hemoglobin 12.0 - 15.0 g/dL 13.2 12.8 13.3  Hematocrit 36 - 46 % 39.0 38.1 40.8  Platelets 150 - 400 K/uL 261.0 240.0 223.0    CMP Latest Ref Rng & Units 04/06/2020 07/08/2019 02/20/2019  Glucose 65 - 99 mg/dL 103(H) 106(H) 96  BUN 8 - 27 mg/dL 20 21 19   Creatinine 0.57 - 1.00 mg/dL 0.85 0.81 0.76  Sodium 134 - 144 mmol/L 141 141 139  Potassium 3.5 - 5.2 mmol/L 4.1 4.0 3.7  Chloride 96 - 106 mmol/L 104 103 103  CO2 20 - 29 mmol/L 25 30 31   Calcium 8.7 - 10.3 mg/dL 9.5 9.7 9.4  Total Protein 6.0 - 8.5 g/dL 6.4 6.7 -  Total Bilirubin 0.0 - 1.2 mg/dL <0.2 0.3 -  Alkaline Phos 44 - 121 IU/L 77 58 -  AST 0 - 40 IU/L 15 14 -  ALT 0 - 32 IU/L 6 5 -    Lipid Panel     Component Value Date/Time   CHOL 310 (H) 04/06/2020 0954   TRIG 83 04/06/2020 0954   HDL 81 04/06/2020 0954   LDLCALC 216 (H) 04/06/2020 0954   LDLDIRECT 211 (H) 04/06/2020 0954   LABVLDL 13 04/06/2020 0954    No components found for: NTPROBNP No results for input(s): PROBNP in the last 8760 hours. Recent Labs    07/08/19 1506  TSH 1.77    BMP Recent Labs    07/08/19 1506 04/06/20 0954  NA 141 141  K 4.0 4.1  CL 103 104  CO2 30 25  GLUCOSE 106* 103*  BUN 21 20  CREATININE 0.81 0.85  CALCIUM 9.7 9.5  GFRNONAA  --  65  GFRAA  --  75    HEMOGLOBIN A1C No results found for: HGBA1C, MPG   External Labs: Collected: 03/17/2020 Creatinine 0.77 mg/dL. AST 15, ALT 7 eGFR: 73 mL/min per 1.73 m TSH: 2.266 and free T4 0.9  Lipid profile: Collected: 08/28/2014 Total cholesterol 332, triglycerides 124, HDL 83, LDL 224, non-HDL 249  Lab Results  Component Value Date   CHOL 310 (H) 04/06/2020   HDL 81 04/06/2020   LDLCALC 216 (H) 04/06/2020   LDLDIRECT 211 (H) 04/06/2020   TRIG 83 04/06/2020   IMPRESSION:    ICD-10-CM   1. Coronary atherosclerosis due to calcified coronary lesion  I25.10 PCV MYOCARDIAL PERFUSION WITH  LEXISCAN   I25.84 aspirin EC 81 MG tablet  2. Hypercholesterolemia  E78.00   3. Bilateral carotid bruits  R09.89   4. Asymptomatic stenosis of left carotid artery  I65.22   5. Former smoker  Z87.891      RECOMMENDATIONS: Ashley Carrillo is a 79 y.o. female whose past medical history and cardiac risk factors include: Hx of DVT x2 left leg, untreated hyperlipidemia, carotid artery atherosclerosis, moderate coronary artery calcification, postmenopausal female, advanced age.  Coronary artery calcification:  Patient noted to have moderate coronary artery calcification on recent study.  Recommend aspirin and statin therapy.  Nuclear  stress test recommended to evaluate for reversible ischemia.  Carotid artery atherosclerosis:  Remains asymptomatic. Patient is educated on symptoms of ipsilateral eye involvement and contralateral body involvement.  1 year follow-up study recommended and orders placed.   Recommend aspirin and statin therapy.  Hypercholesterolemia:  Given her lipid profile recommended pharmacological therapy.  We discussed statin drugs and nonstatin drugs as potential alternatives.   Despite having disease manifestation 2 separate vascular beds patient still reluctant to start statin or non-statin therapy.  Patient states that she will get back with [place] if she wants to start medical therapy.   Patient verbalizes understanding that untreated cholesterol can cause progressive morbidity and mortality including but not limited to heart attacks and strokes.  She is more than welcome to also seek second opinion for the management of her underlying hypercholesterolemia if she chooses.  Former smoker: Educated on the importance of continued smoking cessation.  FINAL MEDICATION LIST END OF ENCOUNTER: Meds ordered this encounter  Medications  . aspirin EC 81 MG tablet    Sig: Take 1 tablet (81 mg total) by mouth daily. Swallow whole.    Dispense:  30 tablet    Refill:  11      Current Outpatient Medications:  .  cholecalciferol (VITAMIN D3) 25 MCG (1000 UNIT) tablet, Take 1,000 Units by mouth daily., Disp: , Rfl:  .  cyanocobalamin 1000 MCG tablet, Take 1,000 mcg by mouth daily., Disp: , Rfl:  .  hydrocortisone 2.5 % ointment, hydrocortisone 2.5 % topical ointment, Disp: , Rfl:  .  mesalamine (LIALDA) 1.2 g EC tablet, Take 4 tablets (4.8 g total) by mouth daily with breakfast. You are past due for an office visit.  Please call to schedule an appt as soon as possible: [phone]. Thank you, Disp: 120 tablet, Rfl: 1 .  OVER THE COUNTER MEDICATION, Take 20 mg by mouth at bedtime. [facility] -Acid Control 20 mg at bedtime, Disp: , Rfl:  .  aspirin EC 81 MG tablet, Take 1 tablet (81 mg total) by mouth daily. Swallow whole., Disp: 30 tablet, Rfl: 11  Orders Placed This Encounter  Procedures  . PCV MYOCARDIAL PERFUSION WITH LEXISCAN   There are no Patient Instructions on file for this visit.   --Continue cardiac medications as reconciled in final medication list. --Return in about 6 months (around 11/18/2020) for Carotid artery disease . Or sooner if needed. --Continue follow-up with your primary care physician regarding the management of your other chronic comorbid conditions.  Patient's questions and concerns were addressed to her satisfaction. She voices understanding of the instructions provided during this encounter.   Total time spent: 35 minutes reviewing the test results, reviewing lipid profile, and discussing disease management.  This note was created using a voice recognition software as a result there may be grammatical errors inadvertently enclosed that do not reflect the nature of this encounter. Every attempt is made to correct such errors.  Rex Kras, [place], [facility]  Pager: [phone] Office: [phone]

## 2020-05-25 ENCOUNTER — Other Ambulatory Visit: Payer: Self-pay | Admitting: Gastroenterology

## 2020-06-08 ENCOUNTER — Other Ambulatory Visit: Payer: Medicare HMO

## 2020-07-18 DIAGNOSIS — O2230 Deep phlebothrombosis in pregnancy, unspecified trimester: Secondary | ICD-10-CM

## 2020-07-18 HISTORY — DX: Deep phlebothrombosis in pregnancy, unspecified trimester: O22.30

## 2020-08-11 ENCOUNTER — Ambulatory Visit: Payer: Medicare HMO | Admitting: Gastroenterology

## 2020-08-12 ENCOUNTER — Other Ambulatory Visit: Payer: Self-pay | Admitting: Gastroenterology

## 2020-09-22 ENCOUNTER — Encounter: Payer: Self-pay | Admitting: Gastroenterology

## 2020-09-22 ENCOUNTER — Ambulatory Visit: Payer: Medicare HMO | Admitting: Gastroenterology

## 2020-09-22 VITALS — BP 134/52 | HR 68 | Ht 66.0 in | Wt 137.1 lb

## 2020-09-22 DIAGNOSIS — K5150 Left sided colitis without complications: Secondary | ICD-10-CM

## 2020-09-22 DIAGNOSIS — G500 Trigeminal neuralgia: Secondary | ICD-10-CM | POA: Diagnosis not present

## 2020-09-22 MED ORDER — MESALAMINE 1.2 G PO TBEC: 4.8000 g | DELAYED_RELEASE_TABLET | Freq: Every day | ORAL | 3 refills | Status: DC

## 2020-09-22 NOTE — Progress Notes (Signed)
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 She has been on prednisone over the years as needed for flares. Historically had notneeded prednisone for a flare for several years until May 2019.She has been doing well for a long time. She has never been on anti-TNFsor immunomodulators in the past. She has been off all medications for a few years at least.   SINCE LAST VISIT 80 year old female here for follow-up visit for her left-sided colitis.  I have not seen her since December 2020.  Recall that she had 3 courses of prednisone in 2019 for flares of her colitis as well as another course in 2020 for flares.  She had felt strongly that it was related to stress in her life at that time and wanted to avoid escalation of therapy.  She has been on 4 tabs of Lialda since that time.  I had recommended a colonoscopy previously to reassess her disease and perform surveillance given duration of disease.  She has wanted to avoid colonoscopy if possible.  Regarding her colitis she is actually been doing really well since of last seen her.  She has not had any flares of disease, has not required any more steroids.  She has pretty regular bowel habits, no blood in her stool.  She is had no anemia, renal function stable.  She has been happy with the regimen, has been compliant with 4 tabs daily for the most part.  No abdominal pains.  Her main complaint at this time is trigeminal neuralgia which has been flaring for the past 6 weeks or so.  She has had so much pain she has not been able to open her mouth well enough to eat anything solid.  She has been relying on a straw to have liquids.  Her flare she thinks is slowly getting better, she has some difficulty with pills because she cannot open her mouth too much but has been able to get things down.  Denies any real dysphagia for the most part after she does swallow.  She is currently finishing course  of prednisone.  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 Arthritis   . Blood clot in vein    in groin  . Carotid artery stenosis, asymptomatic   . GERD (gastroesophageal reflux disease)   . Hyperlipidemia   . Trigeminal neuralgia   . Ulcerative colitis      Past Surgical History:  Procedure Laterality Date  . BALLOON DILATION  07/03/2012   Procedure: BALLOON DILATION;  Surgeon: MaGarlan FairMD;  [facility];  Service: Endoscopy;  Laterality: N/A;  . BALLOON DILATION N/A 11/12/2013   Procedure: BALLOON DILATION;  Surgeon: MaGarlan FairMD;  [facility];  Service: Endoscopy;  Laterality: N/A;  . BREAST BIOPSY  11/01/2011   Procedure: BREAST BIOPSY WITH NEEDLE LOCALIZATION;  Surgeon: HaAdin HectorMD;  [place] [facility] Service: General;  Laterality: Right;  . BREAST LUMPECTOMY  1970   right breast- benign  . COLONOSCOPY WITH PROPOFOL N/A 09/21/2015   Procedure: COLONOSCOPY WITH PROPOFOL;  Surgeon: MaGarlan FairMD;  Location: [facility];  Service: Endoscopy;  Laterality: N/A;  . CYSTECTOMY     finger  .  ESOPHAGOGASTRODUODENOSCOPY N/A 11/12/2013   Procedure: ESOPHAGOGASTRODUODENOSCOPY (EGD);  Surgeon: Garlan Fair, MD;  Location: [facility];  Service: Endoscopy;  Laterality: N/A;  . ESOPHAGOGASTRODUODENOSCOPY (EGD) WITH ESOPHAGEAL DILATION  07/03/2012   Procedure: ESOPHAGOGASTRODUODENOSCOPY (EGD) WITH ESOPHAGEAL DILATION;  Surgeon: Garlan Fair, MD;  [facility];  Service: Endoscopy;  Laterality: N/A;  . ESOPHAGOGASTRODUODENOSCOPY (EGD) WITH PROPOFOL N/A 09/21/2015   Procedure: ESOPHAGOGASTRODUODENOSCOPY (EGD) WITH PROPOFOL;  Surgeon: Garlan Fair, MD;  Location[facility];  Service:  Endoscopy;  Laterality: N/A;   Family History  Problem Relation Age of Onset  . Heart disease Mother   . Prostate cancer Brother   . Diabetes Brother   . Other Brother        unknown cause age 1  . Colon cancer Neg Hx   . Stomach cancer Neg Hx    Social History   Tobacco Use  . Smoking status: Former Smoker    Packs/day: 1.00    Years: 10.00    Pack years: 10.00    Types: Cigarettes    Quit date: 07/18/1968    Years since quitting: 52.2  . Smokeless tobacco: Never Used  Vaping Use  . Vaping Use: Never used  Substance Use Topics  . Alcohol use: No  . Drug use: No   Current Outpatient Medications  Medication Sig Dispense Refill  . aspirin EC 81 MG tablet Take 1 tablet (81 mg total) by mouth daily. Swallow whole. 30 tablet 11  . cholecalciferol (VITAMIN D3) 25 MCG (1000 UNIT) tablet Take 1,000 Units by mouth daily.    . cyanocobalamin 1000 MCG tablet Take 1,000 mcg by mouth daily.    . hydrocortisone 2.5 % ointment hydrocortisone 2.5 % topical ointment    . mesalamine (LIALDA) 1.2 g EC tablet Take 4 tablets (4.8 g total) by mouth daily with breakfast. Please keep your March appointment for further refills. Thank you. 120 tablet 1  . OVER THE COUNTER MEDICATION Take 20 mg by mouth at bedtime. [facility] -Acid Control 20 mg at bedtime    . PREDNISONE, PAK, PO Take by mouth.     No current facility-administered medications for this visit.   Allergies  Allergen Reactions  . Amoxicillin-Pot Clavulanate Diarrhea  . Codeine Nausea And Vomiting  . Simvastatin Other (See Comments)  . Warfarin Other (See Comments)     Review of Systems: All systems reviewed and negative except where noted in HPI.   Lab Results  Component Value Date   WBC 7.8 07/08/2019   HGB 13.2 07/08/2019   HCT 39.0 07/08/2019   MCV 92.4 07/08/2019   PLT 261.0 07/08/2019    Lab Results  Component Value Date   CREATININE 0.85 04/06/2020   BUN 20 04/06/2020   NA 141 04/06/2020   K 4.1 04/06/2020   CL  104 04/06/2020   CO2 25 04/06/2020    Lab Results  Component Value Date   ALT 6 04/06/2020   AST 15 04/06/2020   ALKPHOS 77 04/06/2020   BILITOT <0.2 04/06/2020     Physical Exam: BP (!) 134/52 (BP Location: Left Arm, Patient Position: Sitting, Cuff Size: Normal)   Pulse 68   Ht 5' 6"  (1.676 m)   Wt 137 lb 2 oz (62.2 kg)   BMI 22.13 kg/m  Constitutional: Pleasant,well-developed, female in no acute distress. Neurological: Alert and oriented to person place and time. Psychiatric: Normal mood and affect. Behavior is normal.   ASSESSMENT AND PLAN: 80 year old female here for reassessment of the  following issues:  Left-sided ulcerative colitis Trigeminal neuralgia  As above, in 2019 2020 she had multiple flares of her colitis leading to multiple courses of prednisone, we had discussed biologic therapy but she had not wanted to escalate therapy, felt her flares are more likely due to stressors in her life at that time.  Since then she is actually done really well on 4 tabs of Lialda a day without any flares of her disease.  She has had significant issues with trigeminal neuralgia which is her main complaint at this time.  This appears to be getting better but her nutrition has been limited to only liquids given her inability to open her mouth due to pain.  We discussed long-term if she wishes to have any further surveillance colonoscopies in light of her duration of disease and recent flares in the past 2 years.  Generally she is leaning towards no further surveillance given her age and other medical problems.  She certainly cannot tolerate a prep right now in light of her trigeminal neuralgia and difficulty swallowing.  We discussed over time if she wants to try titrating her Lialda down to 2 tabs a day for maintenance given she has been doing so well lately.  She is fearful of titrating down the Lialda right now with the trigeminal neuralgia giving her problems, she is fearful of another  flare.  When she is feeling up for it she can titrate down to 2 times a day and monitor.  I will see her again in 6 months for reassessment, if she has any flares or concerning symptoms despite Lialda in the interim she will let me know.  Otherwise she is leaning towards no further colonoscopy surveillance, we discussed risks of doing the procedure and risks of not doing the procedure, she understands both sides of this.  All questions answered  [place] Cellar, MD [facility]

## 2020-09-22 NOTE — Patient Instructions (Addendum)
If you are age 81 or older, your body mass index should be between 23-30. Your Body mass index is 22.13 kg/m. If this is out of the aforementioned range listed, please consider follow up with your Primary Care Provider.  If you are age 61 or younger, your body mass index should be between 19-25. Your Body mass index is 22.13 kg/m. If this is out of the aformentioned range listed, please consider follow up with your Primary Care Provider.   We have sent the following medications to your pharmacy for you to pick up at your convenience: Lialda 1.2 g: Take 4 tablet by mouth daily   Please follow up in 6 months. You can call in July or August to schedule in September.   Thank you for entrusting me with your care and for choosing [facility], Dr.  Cellar

## 2020-11-19 ENCOUNTER — Ambulatory Visit: Payer: Medicare HMO | Admitting: Cardiology

## 2020-12-11 ENCOUNTER — Telehealth: Payer: Self-pay | Admitting: Gastroenterology

## 2020-12-11 DIAGNOSIS — R1310 Dysphagia, unspecified: Secondary | ICD-10-CM

## 2020-12-11 NOTE — Telephone Encounter (Signed)
Okay thanks [name] I agree with your recommendations

## 2020-12-11 NOTE — Telephone Encounter (Signed)
Called and spoke to patient.  The mesalamine tablets are very large and she is having trouble swallowing them.  She asked if it is OK to break them in half and and I advised her that it is.  She indicates that she also has to be careful what she eats and she can have problems swallowing other things. I offered her an appointment with Dr. Havery [name] but she declined. She has Trigeminal Neuralgia and is undergoing gamma knife radiation treatments. She has had one and may have another and would rather get though that before she considers EGD. She will call yo schedule an appointment when she is ready or if her Sx worsen.

## 2021-02-04 ENCOUNTER — Encounter: Payer: Self-pay | Admitting: Gastroenterology

## 2021-02-04 NOTE — Telephone Encounter (Signed)
Inbound call from patient. Calling about the problem swallowing and breaking up the mesalamine tablets. States she is having diarrhea as well. Best contact number [phone]

## 2021-02-04 NOTE — Telephone Encounter (Signed)
Called and spoke to patient.  We scheduled her for EGD with Dilation next Wed, 7-27 at 3:30 pm to arrive at 2:30 pm. She indicated she does not need the prednisone right now but will call if her diarrhea worsens for a script. Instructions sent to MyChart and emailed. Asked patient to review instructions and call back with any questions.

## 2021-02-04 NOTE — Telephone Encounter (Signed)
[name] I had a cancellation in Endo for next Wed if you want to use that slot for EGD, we can perform dilation and hopefully help her swallow her tablets better. If that is the case would not recommend switching to sulfasalazine for a few days, that has more risk. If she is really flaring we can give her a few days of prednisone until her procedure, up to her. Thanks

## 2021-02-04 NOTE — Addendum Note (Signed)
Addended by: Roetta Sessions on: 02/04/2021 04:12 PM   Modules accepted: Orders

## 2021-02-04 NOTE — Telephone Encounter (Signed)
[name] thank you very much for your help with this

## 2021-02-04 NOTE — Telephone Encounter (Signed)
Patient reports she is unable to swallow her mesalamine even when they are broken in half.  She is also having trouble swallowing certain foods like chicken  She tried chewing the mesalamine bc she can't do without it but it really nauseated her. Could she switch back to sulfasalazine in the meantime bc that was much easier to take and swallow?  She has developed diarrhea since she is off the mesalamine for a few days and that is making her nervous.  Patient had an EGD with dilation in 2017 and that was very beneficial for several years.  Please advise.  She doesn't feel that she can wait until 9-14 when she is scheduled for an appointment with Dr. Havery [name].

## 2021-02-05 NOTE — Telephone Encounter (Signed)
Instructions email to pt via scanner on copier. Called to pt to let her know and she confirmed that she did receive those instructions. Pt was thankful for call.

## 2021-02-05 NOTE — Telephone Encounter (Signed)
Inbound call from pt stating that she couldn't receive her instructions through MyChart due to losing power. She was wondering if the instructions can be emailed to her. Her emailed is [email or web address]. Thanks.

## 2021-02-10 ENCOUNTER — Other Ambulatory Visit: Payer: Self-pay

## 2021-02-10 ENCOUNTER — Ambulatory Visit (AMBULATORY_SURGERY_CENTER): Payer: Medicare HMO | Admitting: Gastroenterology

## 2021-02-10 ENCOUNTER — Encounter: Payer: Self-pay | Admitting: Gastroenterology

## 2021-02-10 VITALS — BP 132/52 | HR 58 | Temp 97.7°F | Resp 11 | Ht 66.0 in | Wt 137.0 lb

## 2021-02-10 DIAGNOSIS — K449 Diaphragmatic hernia without obstruction or gangrene: Secondary | ICD-10-CM | POA: Diagnosis not present

## 2021-02-10 DIAGNOSIS — K222 Esophageal obstruction: Secondary | ICD-10-CM | POA: Diagnosis not present

## 2021-02-10 DIAGNOSIS — K2270 Barrett's esophagus without dysplasia: Secondary | ICD-10-CM

## 2021-02-10 DIAGNOSIS — K2289 Other specified disease of esophagus: Secondary | ICD-10-CM | POA: Diagnosis not present

## 2021-02-10 DIAGNOSIS — R1310 Dysphagia, unspecified: Secondary | ICD-10-CM | POA: Diagnosis not present

## 2021-02-10 MED ORDER — SODIUM CHLORIDE 0.9 % IV SOLN: 500.0000 mL | Freq: Once | INTRAVENOUS | Status: DC

## 2021-02-10 MED ORDER — PANTOPRAZOLE SODIUM 20 MG PO TBEC: 20.0000 mg | DELAYED_RELEASE_TABLET | Freq: Every day | ORAL | 1 refills | Status: DC

## 2021-02-10 NOTE — Progress Notes (Signed)
Report to PACU, RN, vss, BBS= Clear.  

## 2021-02-10 NOTE — Op Note (Addendum)
[facility] Patient Name: Ashley Carrillo Procedure Date: 02/10/2021 3:58 PM MRN: [id number] Endoscopist: Remo Lipps P. Havery [name] , MD Age: 80 Referring MD:  Date of Birth: 1940/12/02 Gender: Female Account #: [id number] Procedure:                Upper GI endoscopy Indications:              Dysphagia, history of distal esophageal stricture Medicines:                Monitored Anesthesia Care Procedure:                Pre-Anesthesia Assessment:                           - Prior to the procedure, a History and Physical                            was performed, and patient medications and                            allergies were reviewed. The patient's tolerance of                            previous anesthesia was also reviewed. The risks                            and benefits of the procedure and the sedation                            options and risks were discussed with the patient.                            All questions were answered, and informed consent                            was obtained. Prior Anticoagulants: The patient has                            taken no previous anticoagulant or antiplatelet                            agents. ASA Grade Assessment: II - A patient with                            mild systemic disease. After reviewing the risks                            and benefits, the patient was deemed in                            satisfactory condition to undergo the procedure.                           After obtaining informed consent, the endoscope was  passed under direct vision. Throughout the                            procedure, the patient's blood pressure, pulse, and                            oxygen saturations were monitored continuously. The                            GIF [id number] #[id number] was introduced through the                            mouth, and advanced to the second part of duodenum.                             The upper GI endoscopy was accomplished without                            difficulty. The patient tolerated the procedure                            well. Scope In: Scope Out: Findings:                 Esophagogastric landmarks were identified: the                            Z-line was found at 36 cm, the gastroesophageal                            junction was found at 36 cm and the upper extent of                            the gastric folds was found at 38 cm from the                            incisors.                           A 2 cm hiatal hernia was present.                           One benign-appearing, intrinsic severe stenosis was                            found. This stenosis measured 6-7 mm (inner                            diameter) or so x less than one cm (in length). The                            stenosis was traversed with the upper endoscope,  which dilated it in itself, causing multiple                            appropriate mucosal wrents. There was nodular /                            inflammatory changes at the stricture, suspect                            benign inflammatory / reflux, but biopsies were                            taken with a cold forceps for histology and to open                            the stricture further.                           The exam of the esophagus was otherwise normal.                           The entire examined stomach was normal.                           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                           The exam of the duodenum was otherwise normal. Complications:            No immediate complications. Estimated blood loss:                            Minimal. Estimated Blood Loss:     Estimated blood loss was minimal. Impression:               - Esophagogastric landmarks identified.                           - 2 cm hiatal hernia.                            - Benign-appearing esophageal stenosis - dilated                            with the upper endoscope itself. Biopsied.                           - Normal stomach.                           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 The signs and symptoms of potential                            delayed complications were discussed with the                            patient. Return to normal activities tomorrow.                            Written discharge instructions were provided to the                            patient.                           - Post dilation diet today, then soft diet                           - Continue present medications.                           - Recommend starting pantoprazole 52m / day given                            inflammatory changes at the stricture and help                            prevent recurrence (intolerance to omeprazole in                            the past)                           - Await pathology results.                           - Repeat EGD within a month for repeat dilation to                            open lumen further Ashley Carrillo P. AHavery [name] MD 02/10/2021 4:24:14 PM This report has been signed electronically.

## 2021-02-10 NOTE — Progress Notes (Signed)
VS by Anderson  I have reviewed the patient's medical history in detail and updated the computerized patient record.  

## 2021-02-10 NOTE — Patient Instructions (Signed)
Please read handouts provided. Continue present medications. Begin Protonix 20 mg daily. Await pathology results. Post Dilation Diet today, then soft diet.    YOU HAD AN ENDOSCOPIC PROCEDURE TODAY AT [facility]:   Refer to the procedure report that was given to you for any specific questions about what was found during the examination.  If the procedure report does not answer your questions, please call your gastroenterologist to clarify.  If you requested that your care partner not be given the details of your procedure findings, then the procedure report has been included in a sealed envelope for you to review at your convenience later.  YOU SHOULD EXPECT: Some feelings of bloating in the abdomen. Passage of more gas than usual.  Walking can help get rid of the air that was put into your GI tract during the procedure and reduce the bloating. If you had a lower endoscopy (such as a colonoscopy or flexible sigmoidoscopy) you may notice spotting of blood in your stool or on the toilet paper. If you underwent a bowel prep for your procedure, you may not have a normal bowel movement for a few days.  Please Note:  You might notice some irritation and congestion in your nose or some drainage.  This is from the oxygen used during your procedure.  There is no need for concern and it should clear up in a day or so.  SYMPTOMS TO REPORT IMMEDIATELY:    Following upper endoscopy (EGD)  Vomiting of blood or coffee ground material  New chest pain or pain under the shoulder blades  Painful or persistently difficult swallowing  New shortness of breath  Fever of 100F or higher  Black, tarry-looking stools  For urgent or emergent issues, a gastroenterologist can be reached at any hour by calling [phone]. Do not use MyChart messaging for urgent concerns.    DIET  Drink plenty of fluids but you should avoid alcoholic beverages for 24 hours.  ACTIVITY:  You should plan to take  it easy for the rest of today and you should NOT DRIVE or use heavy machinery until tomorrow (because of the sedation medicines used during the test).    FOLLOW UP: Our staff will call the number listed on your records 48-72 hours following your procedure to check on you and address any questions or concerns that you may have regarding the information given to you following your procedure. If we do not reach you, we will leave a message.  We will attempt to reach you two times.  During this call, we will ask if you have developed any symptoms of COVID 19. If you develop any symptoms (ie: fever, flu-like symptoms, shortness of breath, cough etc.) before then, please call [phone].  If you test positive for Covid 19 in the 2 weeks post procedure, please call and report this information to [place].    If any biopsies were taken you will be contacted by phone or by letter within the next 1-3 weeks.  Please call us at [phone] if you have not heard about the biopsies in 3 weeks.    SIGNATURES/CONFIDENTIALITY: You and/or your care partner have signed paperwork which will be entered into your electronic medical record.  These signatures attest to the fact that that the information above on your After Visit Summary has been reviewed and is understood.  Full responsibility of the confidentiality of this discharge information lies with you and/or your care-partner.

## 2021-02-12 ENCOUNTER — Telehealth: Payer: Self-pay

## 2021-02-12 NOTE — Telephone Encounter (Signed)
  Follow up Call-  Call back number 02/10/2021  Post procedure Call Back phone  # [phone]  Permission to leave phone message Yes  Some recent data might be hidden     Patient questions:  Do you have a fever, pain , or abdominal swelling? No. Pain Score  0 *  Have you tolerated food without any problems? Yes.    Have you been able to return to your normal activities? Yes.    Do you have any questions about your discharge instructions: Diet   No. Medications  No. Follow up visit  No.  Do you have questions or concerns about your Care? No.  Actions: * If pain score is 4 or above: No action needed, pain <4.   Have you developed a fever since your procedure? No   2.   Have you had an respiratory symptoms (SOB or cough) since your procedure? No   3.   Have you tested positive for COVID 19 since your procedure no   4.   Have you had any family members/close contacts diagnosed with the COVID 19 since your procedure?  No    If yes to any of these questions please route to Joylene John, RN and Joella Prince, RN

## 2021-03-04 ENCOUNTER — Ambulatory Visit (AMBULATORY_SURGERY_CENTER): Payer: Medicare HMO

## 2021-03-04 ENCOUNTER — Other Ambulatory Visit: Payer: Self-pay

## 2021-03-04 VITALS — Ht 66.0 in | Wt 130.0 lb

## 2021-03-04 DIAGNOSIS — K222 Esophageal obstruction: Secondary | ICD-10-CM

## 2021-03-04 NOTE — Progress Notes (Signed)
Pre visit completed via phone call; Patient verified name, DOB, and address; No egg or soy allergy known to patient  No issues with past sedation with any surgeries or procedures Patient denies ever being told they had issues or difficulty with intubation  No FH of Malignant Hyperthermia No diet pills per patient No home 02 use per patient  No blood thinners per patient  Pt denies issues with constipation  No A fib or A flutter  EMMI video via MyChart  COVID 19 guidelines implemented in [place] today with Pt and RN  Pt is fully vaccinated  for Covid  Repeat EGD from 02/10/2021----- Due to the COVID-19 pandemic we are asking patients to follow certain guidelines.  Pt aware of COVID protocols and [facility] guidelines

## 2021-03-12 ENCOUNTER — Encounter: Payer: Self-pay | Admitting: Gastroenterology

## 2021-03-19 ENCOUNTER — Ambulatory Visit (AMBULATORY_SURGERY_CENTER): Payer: Medicare HMO | Admitting: Gastroenterology

## 2021-03-19 ENCOUNTER — Other Ambulatory Visit: Payer: Self-pay

## 2021-03-19 ENCOUNTER — Encounter: Payer: Self-pay | Admitting: Gastroenterology

## 2021-03-19 VITALS — BP 127/53 | HR 58 | Temp 97.1°F | Resp 13 | Ht 66.0 in | Wt 130.0 lb

## 2021-03-19 DIAGNOSIS — K449 Diaphragmatic hernia without obstruction or gangrene: Secondary | ICD-10-CM | POA: Diagnosis not present

## 2021-03-19 DIAGNOSIS — K208 Other esophagitis without bleeding: Secondary | ICD-10-CM | POA: Diagnosis not present

## 2021-03-19 DIAGNOSIS — R1310 Dysphagia, unspecified: Secondary | ICD-10-CM

## 2021-03-19 DIAGNOSIS — K222 Esophageal obstruction: Secondary | ICD-10-CM

## 2021-03-19 DIAGNOSIS — K2970 Gastritis, unspecified, without bleeding: Secondary | ICD-10-CM

## 2021-03-19 MED ORDER — SODIUM CHLORIDE 0.9 % IV SOLN: 500.0000 mL | Freq: Once | INTRAVENOUS | Status: DC

## 2021-03-19 NOTE — Progress Notes (Signed)
[facility] History and Physical   Primary Care Physician:  [name], MD   Reason for Procedure:  dysphagia  Plan:    EGD with dilation     HPI: Ashley Carrillo is a 80 y.o. female with a history of severe distal esophageal stricture, s/p EGD with dilation on 7/27 with dilation using the endoscope itself. Here for a follow up exam with further dilation as needed. She states dysphagia has been improved but not resolved, she is quite cautious with her diet. Now on protonix 29m / day. Otherwise feels well without complaints.   Past Medical History:  Diagnosis Date   Arthritis    Blood clot in vein    in groin   Carotid artery stenosis, asymptomatic    GERD (gastroesophageal reflux disease)    on meds   Hyperlipidemia    Trigeminal neuralgia    Ulcerative colitis     Past Surgical History:  Procedure Laterality Date   BALLOON DILATION  07/03/2012   Procedure: BALLOON DILATION;  Surgeon: MGarlan Fair MD;  Location: [facility];  Service: Endoscopy;  Laterality: N/A;   BALLOON DILATION N/A 11/12/2013   Procedure: BALLOON DILATION;  Surgeon: MGarlan Fair MD;  Location: [facility];  Service: Endoscopy;  Laterality: N/A;   BREAST BIOPSY  11/01/2011   Procedure: BREAST BIOPSY WITH NEEDLE LOCALIZATION;  Surgeon: HAdin Hector MD;  [facility]  Service: General;  Laterality: Right;   BREAST LUMPECTOMY  1970   right breast- benign   COLONOSCOPY WITH PROPOFOL N/A 09/21/2015   Procedure: COLONOSCOPY WITH PROPOFOL;  Surgeon: MGarlan Fair MD;  Location: [facility];  Service: Endoscopy;  Laterality: N/A;   CYSTECTOMY     finger   ESOPHAGOGASTRODUODENOSCOPY N/A 11/12/2013   Procedure: ESOPHAGOGASTRODUODENOSCOPY (EGD);  Surgeon: MGarlan Fair MD;  [facility];  Service: Endoscopy;  Laterality: N/A;   ESOPHAGOGASTRODUODENOSCOPY (EGD) WITH ESOPHAGEAL DILATION  07/03/2012   Procedure: ESOPHAGOGASTRODUODENOSCOPY  (EGD) WITH ESOPHAGEAL DILATION;  Surgeon: MGarlan Fair MD;  Location: [facility];  Service: Endoscopy;  Laterality: N/A;   ESOPHAGOGASTRODUODENOSCOPY (EGD) WITH PROPOFOL N/A 09/21/2015   Procedure: ESOPHAGOGASTRODUODENOSCOPY (EGD) WITH PROPOFOL;  Surgeon: MGarlan Fair MD;  Location: [facility];  Service: Endoscopy;  Laterality: N/A;    Prior to Admission medications   Medication Sig Start Date End Date Taking? Authorizing Provider  cholecalciferol (VITAMIN D3) 25 MCG (1000 UNIT) tablet Take 1,000 Units by mouth daily.   Yes [provider]  cyanocobalamin 1000 MCG tablet Take 1,000 mcg by mouth daily.   Yes [provider]  hydrocortisone 2.5 % ointment daily as needed.   Yes [provider]  mesalamine (LIALDA) 1.2 g EC tablet Take 4 tablets (4.8 g total) by mouth daily with breakfast. 09/22/20  Yes Mindi Akerson, SCarlota Raspberry MD  pantoprazole (PROTONIX) 20 MG tablet Take 1 tablet (20 mg total) by mouth daily. 02/10/21  Yes Kairy Folsom, SCarlota Raspberry MD    Current Outpatient Medications  Medication Sig Dispense Refill   cholecalciferol (VITAMIN D3) 25 MCG (1000 UNIT) tablet Take 1,000 Units by mouth daily.     cyanocobalamin 1000 MCG tablet Take 1,000 mcg by mouth daily.     hydrocortisone 2.5 % ointment daily as needed.     mesalamine (LIALDA) 1.2 g EC tablet Take 4 tablets (4.8 g total) by mouth daily with breakfast. 120 tablet 3   pantoprazole (PROTONIX) 20 MG tablet Take 1 tablet (20 mg total) by mouth daily.  90 tablet 1   Current Facility-Administered Medications  Medication Dose Route Frequency Provider Last Rate Last Admin   0.9 %  sodium chloride infusion  500 mL Intravenous Once Pinkney Venard, Carlota Raspberry, MD        Allergies as of 03/19/2021 - Review Complete 03/19/2021  Allergen Reaction Noted   Amoxicillin-pot clavulanate Diarrhea 01/27/2016   Codeine Nausea And Vomiting and Other (See Comments) 10/24/2011   Simvastatin Other (See Comments) 01/27/2016    Warfarin Other (See Comments) 01/27/2016   Warfarin sodium Other (See Comments) 03/16/2020    Family History  Problem Relation Age of Onset   Heart disease Mother    Prostate cancer Brother    Diabetes Brother    Other Brother        unknown cause age 56   Colon cancer Neg Hx    Stomach cancer Neg Hx    Colon polyps Neg Hx    Esophageal cancer Neg Hx    Rectal cancer Neg Hx     Social History   Socioeconomic History   Marital status: Married    Spouse name: Not on file   Number of children: 1   Years of education: Not on file   Highest education level: Not on file  Occupational History   Occupation: retired  Tobacco Use   Smoking status: Former    Packs/day: 1.00    Years: 10.00    Pack years: 10.00    Types: Cigarettes    Quit date: 07/18/1968    Years since quitting: 52.7   Smokeless tobacco: Never  Vaping Use   Vaping Use: Never used  Substance and Sexual Activity   Alcohol use: No   Drug use: No   Sexual activity: Never    Partners: Male  Other Topics Concern   Not on file  Social History Narrative   Not on file   Social Determinants of Health   Financial Resource Strain: Not on file  Food Insecurity: Not on file  Transportation Needs: Not on file  Physical Activity: Not on file  Stress: Not on file  Social Connections: Not on file  Intimate Partner Violence: Not on file    Review of Systems: All other review of systems negative except as mentioned in the HPI.  Physical Exam: Vital signs BP (!) 120/53   Pulse 80   Temp (!) 97.1 F (36.2 C)   Ht 5' 6"  (1.676 m)   Wt 130 lb (59 kg)   SpO2 98%   BMI 20.98 kg/m   General:   Alert,  Well-developed, well-nourished, pleasant and cooperative in NAD Lungs:  Clear throughout to auscultation.   Heart:  Regular rate and rhythm;  Abdomen:  Soft, nontender and nondistended.   Neuro/Psych:  Alert and cooperative. Normal mood and affect. A and O x 3  Jolly Mango, MD [facility]  Gastroenterology

## 2021-03-19 NOTE — Patient Instructions (Signed)
FOLLOW DILATION DIET- SEE HANDOUT!!! Continue your normal medications  Await pathology  If you continue to have difficulty swallowing, please call office to have another upper endoscopy in 4 weeks or so  YOU HAD AN ENDOSCOPIC PROCEDURE TODAY AT [facility]:   Refer to the procedure report that was given to you for any specific questions about what was found during the examination.  If the procedure report does not answer your questions, please call your gastroenterologist to clarify.  If you requested that your care partner not be given the details of your procedure findings, then the procedure report has been included in a sealed envelope for you to review at your convenience later.  YOU SHOULD EXPECT: Some feelings of bloating in the abdomen. Passage of more gas than usual.  Walking can help get rid of the air that was put into your GI tract during the procedure and reduce the bloating.  Please Note:  You might notice some irritation and congestion in your nose or some drainage.  This is from the oxygen used during your procedure.  There is no need for concern and it should clear up in a day or so.  SYMPTOMS TO REPORT IMMEDIATELY:  Following upper endoscopy (EGD)  Vomiting of blood or coffee ground material  New chest pain or pain under the shoulder blades  Painful or persistently difficult swallowing  New shortness of breath  Fever of 100F or higher  Black, tarry-looking stools  For urgent or emergent issues, a gastroenterologist can be reached at any hour by calling [phone]. Do not use MyChart messaging for urgent concerns.    DIET:  FOLLOW DILATION DIET- SEE HANDOUT!!!  Drink plenty of fluids but you should avoid alcoholic beverages for 24 hours.  ACTIVITY:  You should plan to take it easy for the rest of today and you should NOT DRIVE or use heavy machinery until tomorrow (because of the sedation medicines used during the test).    FOLLOW UP: Our staff  will call the number listed on your records 48-72 hours following your procedure to check on you and address any questions or concerns that you may have regarding the information given to you following your procedure. If we do not reach you, we will leave a message.  We will attempt to reach you two times.  During this call, we will ask if you have developed any symptoms of COVID 19. If you develop any symptoms (ie: fever, flu-like symptoms, shortness of breath, cough etc.) before then, please call [phone].  If you test positive for Covid 19 in the 2 weeks post procedure, please call and report this information to [place].    If any biopsies were taken you will be contacted by phone or by letter within the next 1-3 weeks.  Please call us at [phone] if you have not heard about the biopsies in 3 weeks.    SIGNATURES/CONFIDENTIALITY: You and/or your care partner have signed paperwork which will be entered into your electronic medical record.  These signatures attest to the fact that that the information above on your After Visit Summary has been reviewed and is understood.  Full responsibility of the confidentiality of this discharge information lies with you and/or your care-partner.

## 2021-03-19 NOTE — Op Note (Signed)
[place] Patient Name: Ashley Carrillo Procedure Date: 03/19/2021 2:01 PM MRN: [id number] Endoscopist: Remo Lipps Ashley Carrillo , MD Age: 80 Referring MD:  Date of Birth: June 13, 1941 Gender: Female Account #: [id number] Procedure:                Upper GI endoscopy Indications:              Dysphagia, For therapy of esophageal stricture -                            EGD 02/10/21 - severe stricture dilated using                            endoscope itself, improvement of dysphagia but not                            resolved. Biopsies taken of the stricture on last                            exam showed intestinal metaplasia but no obvious                            Barrett's on gross exam. Now on protonix once daily Medicines:                Monitored Anesthesia Care Procedure:                Pre-Anesthesia Assessment:                           - Prior to the procedure, a History and Physical                            was performed, and patient medications and                            allergies were reviewed. The patient's tolerance of                            previous anesthesia was also reviewed. The risks                            and benefits of the procedure and the sedation                            options and risks were discussed with the patient.                            All questions were answered, and informed consent                            was obtained. Prior Anticoagulants: The patient has                            taken no previous anticoagulant or antiplatelet  agents. ASA Grade Assessment: II - A patient with                            mild systemic disease. After reviewing the risks                            and benefits, the patient was deemed in                            satisfactory condition to undergo the procedure.                           After obtaining informed consent, the endoscope was                             passed under direct vision. Throughout the                            procedure, the patient's blood pressure, pulse, and                            oxygen saturations were monitored continuously. The                            GIF [id number] #[id number] was introduced through the                            mouth, and advanced to the second part of duodenum.                            The upper GI endoscopy was accomplished without                            difficulty. The patient tolerated the procedure                            well. Scope In: Scope Out: Findings:                 Esophagogastric landmarks were identified: the                            Z-line was found at 35 cm, the gastroesophageal                            junction was found at 35 cm and the upper extent of                            the gastric folds was found at 37 cm from the                            incisors.                           A  2 cm hiatal hernia was present.                           One benign-appearing, intrinsic moderate stenosis                            was found. This stenosis measured less than one cm                            (in length). A TTS dilator was passed through the                            scope. Dilation with an 05-29-12 mm balloon dilator                            was performed to 11 mm, 12 mm and 13 mm.                            Appropriate mucosal wrents were noted. Biopsies                            were taken with a cold forceps for histology. No                            obvious Barrett's seen.                           The exam of the esophagus was otherwise normal.                           The entire examined stomach was normal.                           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 The examined duodenum was normal. Complications:            No immediate complications. Estimated blood loss:                             Minimal. Estimated Blood Loss:     Estimated blood loss was minimal. Impression:               - Esophagogastric landmarks identified.                           - 2 cm hiatal hernia.                           - Benign-appearing esophageal stenosis. Dilated to                            32m with a good result. Biopsied to reassess  following therapy with PPI (Barrett's on last                            biopsies) and to also open the stricture further.                           - Normal stomach.                           - Normal examined duodenum. Recommendation:           - Patient has a contact number available for                            emergencies. The signs and symptoms of potential                            delayed complications were discussed with the                            patient. Return to normal activities tomorrow.                            Written discharge instructions were provided to the                            patient.                           - Post dilation diet as tolerated.                           - Continue present medications.                           - Await pathology results.                           - Await course post dilation. If symptoms persist                            follow up for another EGD in 4 weeks or so. Remo Lipps P. Ashley Richardson, MD 03/19/2021 2:26:03 PM This report has been signed electronically.

## 2021-03-19 NOTE — Progress Notes (Signed)
Sedate, gd SR, tolerated procedure well, VSS, report to RN 

## 2021-03-19 NOTE — Progress Notes (Signed)
Called to room to assist during endoscopic procedure.  Patient ID and intended procedure confirmed with present staff. Received instructions for my participation in the procedure from the performing physician.  

## 2021-03-23 ENCOUNTER — Telehealth: Payer: Self-pay

## 2021-03-23 NOTE — Telephone Encounter (Signed)
  Follow up Call-  Call back number 03/19/2021 02/10/2021  Post procedure Call Back phone  # [phone]  Permission to leave phone message - Yes  Some recent data might be hidden     Patient questions:  Do you have a fever, pain , or abdominal swelling? No. Pain Score  0 *  Have you tolerated food without any problems? Yes.    Have you been able to return to your normal activities? Yes.    Do you have any questions about your discharge instructions: Diet   No. Medications  No. Follow up visit  No.  Do you have questions or concerns about your Care? No.  Actions: * If pain score is 4 or above: No action needed, pain <4.  Have you developed a fever since your procedure? no  2.   Have you had an respiratory symptoms (SOB or cough) since your procedure? no  3.   Have you tested positive for COVID 19 since your procedure no  4.   Have you had any family members/close contacts diagnosed with the COVID 19 since your procedure?  no   If yes to any of these questions please route to Joylene John, RN and Joella Prince, RN

## 2021-03-24 ENCOUNTER — Other Ambulatory Visit: Payer: Self-pay

## 2021-03-24 ENCOUNTER — Ambulatory Visit: Payer: Medicare HMO

## 2021-03-31 ENCOUNTER — Ambulatory Visit: Payer: Medicare HMO | Admitting: Gastroenterology

## 2021-04-01 ENCOUNTER — Other Ambulatory Visit: Payer: Self-pay | Admitting: Cardiology

## 2021-04-01 DIAGNOSIS — R0989 Other specified symptoms and signs involving the circulatory and respiratory systems: Secondary | ICD-10-CM

## 2021-04-01 DIAGNOSIS — I6522 Occlusion and stenosis of left carotid artery: Secondary | ICD-10-CM

## 2021-05-22 ENCOUNTER — Other Ambulatory Visit: Payer: Self-pay | Admitting: Gastroenterology

## 2021-07-28 ENCOUNTER — Other Ambulatory Visit: Payer: Self-pay | Admitting: Gastroenterology

## 2021-08-07 ENCOUNTER — Other Ambulatory Visit: Payer: Self-pay

## 2021-08-07 ENCOUNTER — Emergency Department (HOSPITAL_BASED_OUTPATIENT_CLINIC_OR_DEPARTMENT_OTHER): Payer: Medicare HMO

## 2021-08-07 ENCOUNTER — Emergency Department (HOSPITAL_BASED_OUTPATIENT_CLINIC_OR_DEPARTMENT_OTHER)
Admission: EM | Admit: 2021-08-07 | Discharge: 2021-08-07 | Disposition: A | Discharge status: Home / Self Care | Payer: Medicare HMO | Attending: Emergency Medicine | Admitting: Emergency Medicine

## 2021-08-07 ENCOUNTER — Encounter (HOSPITAL_BASED_OUTPATIENT_CLINIC_OR_DEPARTMENT_OTHER): Payer: Self-pay

## 2021-08-07 DIAGNOSIS — Z20822 Contact with and (suspected) exposure to covid-19: Secondary | ICD-10-CM | POA: Diagnosis not present

## 2021-08-07 DIAGNOSIS — R0982 Postnasal drip: Secondary | ICD-10-CM | POA: Insufficient documentation

## 2021-08-07 DIAGNOSIS — Z8719 Personal history of other diseases of the digestive system: Secondary | ICD-10-CM | POA: Insufficient documentation

## 2021-08-07 DIAGNOSIS — J029 Acute pharyngitis, unspecified: Secondary | ICD-10-CM | POA: Insufficient documentation

## 2021-08-07 DIAGNOSIS — R0981 Nasal congestion: Secondary | ICD-10-CM | POA: Diagnosis present

## 2021-08-07 LAB — RESP PANEL BY RT-PCR (FLU A&B, COVID) ARPGX2
Influenza A by PCR: NEGATIVE
Influenza B by PCR: NEGATIVE
SARS Coronavirus 2 by RT PCR: NEGATIVE

## 2021-08-07 MED ORDER — METHYLPREDNISOLONE 4 MG PO TBPK: ORAL_TABLET | ORAL | 0 refills | Status: DC

## 2021-08-07 MED ORDER — OXYMETAZOLINE HCL 0.05 % NA SOLN: 1.0000 | Freq: Two times a day (BID) | NASAL | 0 refills | Status: AC | PRN

## 2021-08-07 NOTE — ED Triage Notes (Signed)
Patient here POV from Home with Flu-Like Symptoms.  Patient states Symptoms began yesterday. Symptoms include Congestion, Sore Throat.  No Fevers. Home COVID-19 Test was Negative.  NAD Noted during Triage. A&Ox4. GCS 15. Ambulatory.

## 2021-08-07 NOTE — ED Provider Notes (Signed)
[place] [facility] EMERGENCY DEPT Provider Note   CSN: [id number] Arrival date & time: 08/07/21  1331     History  Chief Complaint  Patient presents with   Nasal Congestion    Ashley Carrillo is a 81 y.o. female.  HPI  81 year old female with past medical history of ulcerative colitis presents emergency department nasal congestion.  Patient states that this has been going on since yesterday morning.  She is complaining of sinus pressure, nasal congestion that is keeping her from sleeping at night, postnasal drip, mild sore throat.  Denies any chest congestion, productive cough, GI symptoms.  Has not been using any over-the-counter medications as she tries to avoid any medicine given her history of UC.  States that she is had this in the past and has benefited from a steroid taper.  No recent sick contacts.  Home Medications Prior to Admission medications   Medication Sig Start Date End Date Taking? Authorizing Provider  methylPREDNISolone (MEDROL DOSEPAK) 4 MG TBPK tablet Take as directed 08/07/21  Yes Gurman Ashland, Alvin Critchley, DO  oxymetazoline (AFRIN NASAL SPRAY) 0.05 % nasal spray Place 1 spray into both nostrils 2 (two) times daily as needed for up to 3 days for congestion. May start using just at night and alternate which nostril use.  Do not use over 3 days to avoid any rebound congestion. 08/07/21 08/10/21 Yes Buford Gayler, Alvin Critchley, DO  cholecalciferol (VITAMIN D3) 25 MCG (1000 UNIT) tablet Take 1,000 Units by mouth daily.    [provider]  cyanocobalamin 1000 MCG tablet Take 1,000 mcg by mouth daily.    [provider]  hydrocortisone 2.5 % ointment daily as needed.    [provider]  mesalamine (LIALDA) 1.2 g EC tablet Take 4 tablets (4.8 g total) by mouth daily with breakfast. You are overdue for an appointment. Please call to schedule asap. Appointment needed for further refills. 07/28/21   Armbruster, Carlota Raspberry, MD  pantoprazole (PROTONIX) 20 MG tablet  Take 1 tablet (20 mg total) by mouth daily. 02/10/21   Armbruster, Carlota Raspberry, MD      Allergies    Amoxicillin-pot clavulanate, Codeine, Simvastatin, Warfarin, and Warfarin sodium    Review of Systems   Review of Systems  Constitutional:  Positive for appetite change. Negative for fever.  HENT:  Positive for congestion and sinus pressure.   Respiratory:  Negative for cough and shortness of breath.   Cardiovascular:  Negative for chest pain.  Gastrointestinal:  Negative for abdominal pain, diarrhea and vomiting.  Skin:  Negative for rash.  Neurological:  Negative for headaches.   Physical Exam Updated Vital Signs BP (!) 154/54    Pulse 84    Temp 98 F (36.7 C)    Resp 16    Ht 5\' 6"  (1.676 m)    Wt 59 kg    SpO2 99%    BMI 20.99 kg/m  Physical Exam Vitals and nursing note reviewed.  Constitutional:      General: She is not in acute distress.    Appearance: Normal appearance.  HENT:     Head: Normocephalic.     Right Ear: Tympanic membrane normal.     Left Ear: Tympanic membrane normal.     Nose: Congestion present. No rhinorrhea.     Mouth/Throat:     Mouth: Mucous membranes are moist.     Pharynx: No oropharyngeal exudate or posterior oropharyngeal erythema.  Cardiovascular:     Rate and Rhythm: Normal rate.  Pulmonary:  Effort: Pulmonary effort is normal. No respiratory distress.  Abdominal:     Palpations: Abdomen is soft.     Tenderness: There is no abdominal tenderness.  Skin:    General: Skin is warm.  Neurological:     Mental Status: She is alert and oriented to person, place, and time. Mental status is at baseline.  Psychiatric:        Mood and Affect: Mood normal.    ED Results / Procedures / Treatments   Labs (all labs ordered are listed, but only abnormal results are displayed) Labs Reviewed  RESP PANEL BY RT-PCR (FLU A&B, COVID) [id number]    EKG None  Radiology DG Chest Portable 1 View  Result Date: 08/07/2021 CLINICAL DATA:  Cough. EXAM:  PORTABLE CHEST 1 VIEW COMPARISON:  None. FINDINGS: 1627 hours. The lungs are clear without focal pneumonia, edema, pneumothorax or pleural effusion. The cardiopericardial silhouette is within normal limits for size. The visualized bony structures of the thorax show no acute abnormality. IMPRESSION: No active disease. Electronically Signed   By: Misty Stanley M.D.   On: 08/07/2021 16:41    Procedures Procedures    Medications Ordered in ED Medications - No data to display  ED Course/ Medical Decision Making/ A&P                           Medical Decision Making Risk OTC drugs. Prescription drug management.   81 year old female presents emergency room with nasal congestion, sinus pressure, mild sore throat.  Vitals are stable on arrival, afebrile.  Flu and COVID swab is negative, chest x-ray is clear.  She has some rhinorrhea on exam, mildly erythematous throat without any exudates.  Otherwise very well-appearing, conversational.  She is had some decreased appetite but no other acute symptoms.  Patient is suffering from mild sinus congestion/rhinorrhea.  Plan to treat symptomatically in addition to nasal spray.  Patient will follow-up with her primary doctor for reevaluation in 2 days.  Patient at this time appears safe and stable for discharge and close outpatient follow up. Discharge plan and strict return to ED precautions discussed, patient verbalizes understanding and agreement.        Final Clinical Impression(s) / ED Diagnoses Final diagnoses:  Sinus congestion    Rx / DC Orders ED Discharge Orders          Ordered    methylPREDNISolone (MEDROL DOSEPAK) 4 MG TBPK tablet        08/07/21 1725    oxymetazoline (AFRIN NASAL SPRAY) 0.05 % nasal spray  2 times daily PRN        08/07/21 1725              Melton Walls, Alvin Critchley, DO 08/07/21 1731

## 2021-08-07 NOTE — Discharge Instructions (Signed)
You have been seen and discharged from the emergency department.  Take steroid taper as directed and use nasal spray as prescribed.  Follow-up with your primary provider for further evaluation and further care. Take home medications as prescribed. If you have any worsening symptoms or further concerns for your health please return to an emergency department for further evaluation.

## 2021-08-15 ENCOUNTER — Other Ambulatory Visit: Payer: Self-pay

## 2021-08-15 ENCOUNTER — Encounter (HOSPITAL_COMMUNITY): Payer: Self-pay

## 2021-08-15 ENCOUNTER — Emergency Department (HOSPITAL_COMMUNITY): Payer: Medicare HMO

## 2021-08-15 ENCOUNTER — Emergency Department (HOSPITAL_COMMUNITY)
Admission: EM | Admit: 2021-08-15 | Discharge: 2021-08-15 | Disposition: A | Discharge status: Home / Self Care | Payer: Medicare HMO | Attending: Emergency Medicine | Admitting: Emergency Medicine

## 2021-08-15 DIAGNOSIS — R0602 Shortness of breath: Secondary | ICD-10-CM | POA: Diagnosis not present

## 2021-08-15 DIAGNOSIS — U071 COVID-19: Secondary | ICD-10-CM

## 2021-08-15 DIAGNOSIS — R197 Diarrhea, unspecified: Secondary | ICD-10-CM | POA: Insufficient documentation

## 2021-08-15 DIAGNOSIS — R1110 Vomiting, unspecified: Secondary | ICD-10-CM | POA: Diagnosis present

## 2021-08-15 DIAGNOSIS — R112 Nausea with vomiting, unspecified: Secondary | ICD-10-CM | POA: Diagnosis not present

## 2021-08-15 LAB — COMPREHENSIVE METABOLIC PANEL
ALT: 9 U/L (ref 0–44)
AST: 18 U/L (ref 15–41)
Albumin: 3.3 g/dL — ABNORMAL LOW (ref 3.5–5.0)
Alkaline Phosphatase: 46 U/L (ref 38–126)
Anion gap: 10 (ref 5–15)
BUN: 22 mg/dL (ref 8–23)
CO2: 28 mmol/L (ref 22–32)
Calcium: 8.4 mg/dL — ABNORMAL LOW (ref 8.9–10.3)
Chloride: 100 mmol/L (ref 98–111)
Creatinine, Ser: 0.82 mg/dL (ref 0.44–1.00)
GFR, Estimated: >60 mL/min (ref >60)
Glucose, Bld: 116 mg/dL — ABNORMAL HIGH (ref 70–99)
Potassium: 3.3 mmol/L — ABNORMAL LOW (ref 3.5–5.1)
Sodium: 138 mmol/L (ref 135–145)
Total Bilirubin: 0.4 mg/dL (ref 0.3–1.2)
Total Protein: 6 g/dL — ABNORMAL LOW (ref 6.5–8.1)

## 2021-08-15 LAB — CBC WITH DIFFERENTIAL/PLATELET
Abs Immature Granulocytes: 0.29 10*3/uL — ABNORMAL HIGH (ref 0.00–0.07)
Basophils Absolute: 0.1 10*3/uL (ref 0.0–0.1)
Basophils Relative: 1 %
Eosinophils Absolute: 0.1 10*3/uL (ref 0.0–0.5)
Eosinophils Relative: 1 %
HCT: 44.8 % (ref 36.0–46.0)
Hemoglobin: 14.4 g/dL (ref 12.0–15.0)
Immature Granulocytes: 2 %
Lymphocytes Relative: 24 %
Lymphs Abs: 3.1 10*3/uL (ref 0.7–4.0)
MCH: 29.9 pg (ref 26.0–34.0)
MCHC: 32.1 g/dL (ref 30.0–36.0)
MCV: 92.9 fL (ref 80.0–100.0)
Monocytes Absolute: 1.1 10*3/uL — ABNORMAL HIGH (ref 0.1–1.0)
Monocytes Relative: 8 %
Neutro Abs: 8.4 10*3/uL — ABNORMAL HIGH (ref 1.7–7.7)
Neutrophils Relative %: 64 %
Platelets: 191 10*3/uL (ref 150–400)
RBC: 4.82 MIL/uL (ref 3.87–5.11)
RDW: 15.3 % (ref 11.5–15.5)
WBC: 13 10*3/uL — ABNORMAL HIGH (ref 4.0–10.5)
nRBC: 0 % (ref 0.0–0.2)

## 2021-08-15 LAB — LIPASE, BLOOD: Lipase: 35 U/L (ref 11–51)

## 2021-08-15 MED ORDER — ONDANSETRON 4 MG PO TBDP: ORAL_TABLET | ORAL | 0 refills | Status: DC

## 2021-08-15 MED ORDER — ONDANSETRON HCL 4 MG/2ML IJ SOLN: 4.0000 mg | Freq: Once | INTRAMUSCULAR | Status: DC

## 2021-08-15 MED ORDER — SODIUM CHLORIDE 0.9 % IV BOLUS
1000.0000 mL | Freq: Once | INTRAVENOUS | Status: AC
  Administered 2021-08-15: 1000 mL via INTRAVENOUS

## 2021-08-15 NOTE — ED Triage Notes (Signed)
"  From home, diagnosed with covid and sinusitis about a week ago, took anti-viral and antibiotics, last dose today, developed N/V/D this morning, got weak and couldn't get up out of bathroom floor" per EMS Zofran total of 8mg  IV given enroute with 261ml NS bolus

## 2021-08-15 NOTE — ED Provider Notes (Signed)
[facility] DEPT Provider Note   CSN: [id number] Arrival date & time: 08/15/21  1513     History  Chief Complaint  Patient presents with   Emesis    Ashley Carrillo is a 81 y.o. female here with vomiting and shortness of breath.  Patient was seen in [place] on 1/21 for sinus congestion and was negative for COVID and flu at that time. She wasPrescribed Medrol Dosepak. Patient then tested positive for COVID on 1/24.  She talked to her doctor and prescribe paxlovid and also azithromycin.  Patient states that she has also declined this.  She states that she is on her last day of antibiotics and Paxlovid.  She states that she has been having nausea and 3-4 episodes of watery diarrhea today.  She has no history of C. difficile.  She just states that she just feels weak and tired.  She was given Zofran and IV fluids prior to arrival.  Denies any fevers.  She states that she had some shortness of breath associated with COVID but not getting worse.   The history is provided by the patient.      Home Medications Prior to Admission medications   Medication Sig Start Date End Date Taking? Authorizing Provider  cholecalciferol (VITAMIN D3) 25 MCG (1000 UNIT) tablet Take 1,000 Units by mouth daily.    [provider]  cyanocobalamin 1000 MCG tablet Take 1,000 mcg by mouth daily.    [provider]  hydrocortisone 2.5 % ointment daily as needed.    [provider]  mesalamine (LIALDA) 1.2 g EC tablet Take 4 tablets (4.8 g total) by mouth daily with breakfast. You are overdue for an appointment. Please call to schedule asap. Appointment needed for further refills. 07/28/21   Armbruster, Carlota Raspberry, MD  methylPREDNISolone (MEDROL DOSEPAK) 4 MG TBPK tablet Take as directed 08/07/21   Horton, Alvin Critchley, DO  pantoprazole (PROTONIX) 20 MG tablet Take 1 tablet (20 mg total) by mouth daily. 02/10/21   Armbruster, Carlota Raspberry, MD      Allergies     Amoxicillin-pot clavulanate, Codeine, Simvastatin, Warfarin, and Warfarin sodium    Review of Systems   Review of Systems  Gastrointestinal:  Positive for diarrhea.  All other systems reviewed and are negative.  Physical Exam Updated Vital Signs BP (!) 131/51 (BP Location: Right Arm)    Pulse 71    Temp 97.7 F (36.5 C)    Resp 18    Ht 5\' 6"  (1.676 m)    Wt 59 kg    SpO2 98%    BMI 20.98 kg/m  Physical Exam Vitals and nursing note reviewed.  Constitutional:      Appearance: Normal appearance.  HENT:     Head: Normocephalic.     Nose: Nose normal.     Mouth/Throat:     Mouth: Mucous membranes are dry.  Eyes:     Extraocular Movements: Extraocular movements intact.     Pupils: Pupils are equal, round, and reactive to light.  Cardiovascular:     Rate and Rhythm: Normal rate and regular rhythm.     Pulses: Normal pulses.     Heart sounds: Normal heart sounds.  Pulmonary:     Effort: Pulmonary effort is normal.     Breath sounds: Normal breath sounds.  Abdominal:     General: Abdomen is flat.     Palpations: Abdomen is soft.  Musculoskeletal:        General: Normal range  of motion.     Cervical back: Normal range of motion and neck supple.  Skin:    General: Skin is warm.     Capillary Refill: Capillary refill takes less than 2 seconds.  Neurological:     General: No focal deficit present.     Mental Status: She is alert and oriented to person, place, and time.  Psychiatric:        Mood and Affect: Mood normal.        Behavior: Behavior normal.    ED Results / Procedures / Treatments   Labs (all labs ordered are listed, but only abnormal results are displayed) Labs Reviewed  CBC WITH DIFFERENTIAL/PLATELET - Abnormal; Notable for the following components:      Result Value   WBC 13.0 (*)    Neutro Abs 8.4 (*)    Monocytes Absolute 1.1 (*)    Abs Immature Granulocytes 0.29 (*)    All other components within normal limits  COMPREHENSIVE METABOLIC PANEL -  Abnormal; Notable for the following components:   Potassium 3.3 (*)    Glucose, Bld 116 (*)    Calcium 8.4 (*)    Total Protein 6.0 (*)    Albumin 3.3 (*)    All other components within normal limits  LIPASE, BLOOD    EKG None  Radiology DG Chest Port 1 View  Result Date: 08/15/2021 CLINICAL DATA:  Short of breath. Diagnosed with COVID-19 1 week ago. EXAM: PORTABLE CHEST 1 VIEW COMPARISON:  08/07/2021. FINDINGS: Cardiac silhouette is normal in size and configuration. Normal mediastinal and hilar contours. Clear lungs.  No pleural effusion or pneumothorax. Skeletal structures are grossly intact IMPRESSION: No active disease. Electronically Signed   By: Lajean Manes M.D.   On: 08/15/2021 16:39    Procedures Procedures    Medications Ordered in ED Medications  sodium chloride 0.9 % bolus 1,000 mL (1,000 mLs Intravenous New Bag/Given 08/15/21 1555)    ED Course/ Medical Decision Making/ A&P                           Medical Decision Making Ashley Carrillo is a 81 y.o. female here presenting with diarrhea and mild shortness of breath.  Patient was diagnosed with COVID about 5 days ago.  Patient is on her last dose of azithromycin and paxlovid.  She is now having diarrhea.  I think likely medication side effect versus C. difficile versus viral gastroenteritis.  Will order CBC and CMP and lipase and chest x-ray.  Will hydrate patient and reassess.   4:48 PM Patient's labs are unremarkable.  Patient's chest x-ray showed no infiltrate.  Patient feeling better after IV fluids.  Patient able to tolerate p.o.  Stable for discharge.  I think likely viral gastroenteritis versus medication side effect versus symptoms from COVID.  Will discharge home with Zofran as needed.  Problems Addressed: COVID-19: acute illness or injury Nausea and vomiting, unspecified vomiting type: acute illness or injury  Amount and/or Complexity of Data Reviewed External Data Reviewed: notes. Labs: ordered.  Decision-making details documented in ED Course. Radiology: ordered and independent interpretation performed.   Final Clinical Impression(s) / ED Diagnoses Final diagnoses:  None    Rx / DC Orders ED Discharge Orders     None         Drenda Freeze, MD 08/15/21 1652

## 2021-08-15 NOTE — Discharge Instructions (Signed)
You likely have side effects from medications or a stomach virus.  Please take Zofran as needed for nausea  Stay hydrated  Follow-up with your doctor  Return to ER if you have worse vomiting, trouble breathing, abdominal pain, fever

## 2021-08-24 ENCOUNTER — Emergency Department (HOSPITAL_COMMUNITY): Payer: Medicare HMO

## 2021-08-24 ENCOUNTER — Other Ambulatory Visit: Payer: Self-pay

## 2021-08-24 ENCOUNTER — Inpatient Hospital Stay (HOSPITAL_COMMUNITY)
Admission: EM | Admit: 2021-08-24 | Discharge: 2021-08-29 | DRG: 271 | Disposition: A | Discharge status: Home Health | Payer: Medicare HMO | Attending: Internal Medicine | Admitting: Internal Medicine

## 2021-08-24 ENCOUNTER — Observation Stay (HOSPITAL_COMMUNITY): Payer: Medicare HMO

## 2021-08-24 ENCOUNTER — Encounter (HOSPITAL_COMMUNITY): Payer: Self-pay

## 2021-08-24 DIAGNOSIS — K219 Gastro-esophageal reflux disease without esophagitis: Secondary | ICD-10-CM | POA: Diagnosis present

## 2021-08-24 DIAGNOSIS — K5150 Left sided colitis without complications: Secondary | ICD-10-CM | POA: Diagnosis present

## 2021-08-24 DIAGNOSIS — Z7401 Bed confinement status: Secondary | ICD-10-CM

## 2021-08-24 DIAGNOSIS — I82402 Acute embolism and thrombosis of unspecified deep veins of left lower extremity: Secondary | ICD-10-CM

## 2021-08-24 DIAGNOSIS — I871 Compression of vein: Secondary | ICD-10-CM | POA: Diagnosis present

## 2021-08-24 DIAGNOSIS — Z86718 Personal history of other venous thrombosis and embolism: Secondary | ICD-10-CM | POA: Diagnosis not present

## 2021-08-24 DIAGNOSIS — I82462 Acute embolism and thrombosis of left calf muscular vein: Secondary | ICD-10-CM | POA: Diagnosis present

## 2021-08-24 DIAGNOSIS — Z8616 Personal history of COVID-19: Secondary | ICD-10-CM

## 2021-08-24 DIAGNOSIS — I82409 Acute embolism and thrombosis of unspecified deep veins of unspecified lower extremity: Secondary | ICD-10-CM | POA: Diagnosis not present

## 2021-08-24 DIAGNOSIS — Z8249 Family history of ischemic heart disease and other diseases of the circulatory system: Secondary | ICD-10-CM

## 2021-08-24 DIAGNOSIS — I82412 Acute embolism and thrombosis of left femoral vein: Secondary | ICD-10-CM | POA: Diagnosis present

## 2021-08-24 DIAGNOSIS — K5190 Ulcerative colitis, unspecified, without complications: Secondary | ICD-10-CM

## 2021-08-24 DIAGNOSIS — I82422 Acute embolism and thrombosis of left iliac vein: Principal | ICD-10-CM | POA: Diagnosis present

## 2021-08-24 DIAGNOSIS — I82442 Acute embolism and thrombosis of left tibial vein: Secondary | ICD-10-CM | POA: Diagnosis present

## 2021-08-24 DIAGNOSIS — E785 Hyperlipidemia, unspecified: Secondary | ICD-10-CM | POA: Diagnosis present

## 2021-08-24 DIAGNOSIS — Z833 Family history of diabetes mellitus: Secondary | ICD-10-CM

## 2021-08-24 DIAGNOSIS — Z8042 Family history of malignant neoplasm of prostate: Secondary | ICD-10-CM

## 2021-08-24 DIAGNOSIS — Z87891 Personal history of nicotine dependence: Secondary | ICD-10-CM

## 2021-08-24 DIAGNOSIS — I82432 Acute embolism and thrombosis of left popliteal vein: Secondary | ICD-10-CM | POA: Diagnosis present

## 2021-08-24 DIAGNOSIS — I824Y2 Acute embolism and thrombosis of unspecified deep veins of left proximal lower extremity: Secondary | ICD-10-CM

## 2021-08-24 DIAGNOSIS — I82452 Acute embolism and thrombosis of left peroneal vein: Secondary | ICD-10-CM | POA: Diagnosis present

## 2021-08-24 DIAGNOSIS — Z79899 Other long term (current) drug therapy: Secondary | ICD-10-CM

## 2021-08-24 LAB — CBC WITH DIFFERENTIAL/PLATELET
Abs Immature Granulocytes: 0.1 K/uL — ABNORMAL HIGH (ref 0.00–0.07)
Basophils Absolute: 0 K/uL (ref 0.0–0.1)
Basophils Relative: 0 %
Eosinophils Absolute: 0.1 K/uL (ref 0.0–0.5)
Eosinophils Relative: 1 %
HCT: 36.4 % (ref 36.0–46.0)
Hemoglobin: 12 g/dL (ref 12.0–15.0)
Immature Granulocytes: 1 %
Lymphocytes Relative: 10 %
Lymphs Abs: 1.5 K/uL (ref 0.7–4.0)
MCH: 29.8 pg (ref 26.0–34.0)
MCHC: 33 g/dL (ref 30.0–36.0)
MCV: 90.3 fL (ref 80.0–100.0)
Monocytes Absolute: 1.4 K/uL — ABNORMAL HIGH (ref 0.1–1.0)
Monocytes Relative: 9 %
Neutro Abs: 12.3 K/uL — ABNORMAL HIGH (ref 1.7–7.7)
Neutrophils Relative %: 79 %
Platelets: 154 K/uL (ref 150–400)
RBC: 4.03 MIL/uL (ref 3.87–5.11)
RDW: 14.4 % (ref 11.5–15.5)
WBC: 15.5 K/uL — ABNORMAL HIGH (ref 4.0–10.5)
nRBC: 0 % (ref 0.0–0.2)

## 2021-08-24 LAB — BASIC METABOLIC PANEL WITH GFR
Anion gap: 9 (ref 5–15)
BUN: 20 mg/dL (ref 8–23)
CO2: 26 mmol/L (ref 22–32)
Calcium: 8.5 mg/dL — ABNORMAL LOW (ref 8.9–10.3)
Chloride: 96 mmol/L — ABNORMAL LOW (ref 98–111)
Creatinine, Ser: 0.89 mg/dL (ref 0.44–1.00)
GFR, Estimated: >60 mL/min (ref >60)
Glucose, Bld: 125 mg/dL — ABNORMAL HIGH (ref 70–99)
Potassium: 4.1 mmol/L (ref 3.5–5.1)
Sodium: 131 mmol/L — ABNORMAL LOW (ref 135–145)

## 2021-08-24 LAB — RESP PANEL BY RT-PCR (FLU A&B, COVID) ARPGX2
Influenza A by PCR: NEGATIVE
Influenza B by PCR: NEGATIVE
SARS Coronavirus 2 by RT PCR: NEGATIVE

## 2021-08-24 MED ORDER — PANTOPRAZOLE SODIUM 20 MG PO TBEC
20.0000 mg | DELAYED_RELEASE_TABLET | Freq: Every day | ORAL | Status: DC
  Administered 2021-08-25 – 2021-08-29 (×4): 20 mg via ORAL
  Filled 2021-08-24 (×5): qty 1

## 2021-08-24 MED ORDER — SENNOSIDES-DOCUSATE SODIUM 8.6-50 MG PO TABS: 1.0000 | ORAL_TABLET | Freq: Every evening | ORAL | Status: DC | PRN

## 2021-08-24 MED ORDER — ONDANSETRON HCL 4 MG PO TABS: 4.0000 mg | ORAL_TABLET | Freq: Four times a day (QID) | ORAL | Status: DC | PRN

## 2021-08-24 MED ORDER — IOHEXOL 350 MG/ML SOLN
100.0000 mL | Freq: Once | INTRAVENOUS | Status: AC | PRN
  Administered 2021-08-24: 100 mL via INTRAVENOUS

## 2021-08-24 MED ORDER — SODIUM CHLORIDE 0.9% FLUSH
3.0000 mL | Freq: Two times a day (BID) | INTRAVENOUS | Status: DC
  Administered 2021-08-25 – 2021-08-29 (×5): 3 mL via INTRAVENOUS

## 2021-08-24 MED ORDER — ACETAMINOPHEN 650 MG RE SUPP: 650.0000 mg | Freq: Four times a day (QID) | RECTAL | Status: DC | PRN

## 2021-08-24 MED ORDER — ACETAMINOPHEN 325 MG PO TABS: 650.0000 mg | ORAL_TABLET | Freq: Four times a day (QID) | ORAL | Status: DC | PRN

## 2021-08-24 MED ORDER — MESALAMINE 1.2 G PO TBEC
2.4000 g | DELAYED_RELEASE_TABLET | Freq: Two times a day (BID) | ORAL | Status: DC
  Administered 2021-08-25 – 2021-08-29 (×5): 2.4 g via ORAL
  Filled 2021-08-24 (×11): qty 2

## 2021-08-24 MED ORDER — HEPARIN BOLUS VIA INFUSION
2000.0000 [IU] | Freq: Once | INTRAVENOUS | Status: AC
  Administered 2021-08-24: 2000 [IU] via INTRAVENOUS
  Filled 2021-08-24: qty 2000

## 2021-08-24 MED ORDER — ONDANSETRON HCL 4 MG/2ML IJ SOLN
4.0000 mg | Freq: Four times a day (QID) | INTRAMUSCULAR | Status: DC | PRN
  Administered 2021-08-25 – 2021-08-28 (×2): 4 mg via INTRAVENOUS
  Filled 2021-08-24 (×3): qty 2

## 2021-08-24 MED ORDER — HEPARIN (PORCINE) 25000 UT/250ML-% IV SOLN
950.0000 [IU]/h | INTRAVENOUS | Status: DC
  Administered 2021-08-24: 950 [IU]/h via INTRAVENOUS
  Filled 2021-08-24: qty 250

## 2021-08-24 NOTE — H&P (Signed)
History and Physical    Ashley Carrillo VXB:[id number] DOB: 07-14-1941 DOA: 08/24/2021  PCP: [name], MD  Patient coming from: Home via EMS  I have personally briefly reviewed patient's old medical records in [place]  Chief Complaint: Left foot swelling and discoloration  HPI: Ashley Carrillo is a 81 y.o. female with medical history significant for HLD, GERD, history of LLE DVT, left-sided ulcerative colitis, history of GI bleeding, trigeminal neuralgia who presented to the ED for evaluation of left lower extremity swelling and discoloration.  Patient states that she had COVID-19 infection 2 weeks ago.  She had URI/sinusitis type symptoms.  She was treated with Paxlovid and azithromycin.  Patient states that she has been generally weak and fatigued since then.  She says that she has been bed ridden these last 2 weeks and has not been getting up or walking during this time.  Patient states she noticed swelling to her left leg over the last day or so.  She says she had a DVT in the left leg previously and this seems similar to that episode and therefore she came to the ED for further evaluation.  She does state that she was treated with Eliquis for prior DVT but had complication of GI bleeding and was ultimately taken off anticoagulation.  She has occasional cough with deep inspiration but otherwise denies any chest pain, dyspnea, nausea, vomiting, abdominal pain, dysuria.  She denies any recent obvious bleeding.  ED Course   Labs/Imaging on admission: I have personally reviewed following labs and imaging studies.  Initial vitals showed BP 129/53, pulse 89, RR 15, temp 98.3 F, SPO2 99% on room air.  Labs show WBC 15.5, hemoglobin 12.0, platelets 154,000, sodium 131, potassium 4.1, bicarb 26, BUN 20, creatinine 0.89, serum glucose 125.  SARS-CoV-2 and influenza PCR negative.  Left lower extremity venous ultrasound shows extensive acute DVT involving the left common femoral  vein, SF junction, left femoral vein, left proximal profunda vein, left popliteal vein, left posterior tibial veins, left peroneal veins, and left gastrocnemius veins.  IVC/iliac ultrasound shows acute thrombus involving the left common iliac and left external iliac vein.  No evidence of thrombus involving the IVC.  Patient was started on IV heparin.  EDP discussed with on-call IR, Dr. Maryelizabeth Kaufmann, who recommended CT venogram and they will follow for potential vascular intervention.  The hospitalist service was consulted to admit for further evaluation and management.  Review of Systems: All systems reviewed and are negative except as documented in history of present illness above.   Past Medical History:  Diagnosis Date   Arthritis    Blood clot in vein    in groin   Carotid artery stenosis, asymptomatic    GERD (gastroesophageal reflux disease)    on meds   Hyperlipidemia    Trigeminal neuralgia    Ulcerative colitis     Past Surgical History:  Procedure Laterality Date   BALLOON DILATION  07/03/2012   Procedure: BALLOON DILATION;  Surgeon: Garlan Fair, MD;  Location: [facility];  Service: Endoscopy;  Laterality: N/A;   BALLOON DILATION N/A 11/12/2013   Procedure: BALLOON DILATION;  Surgeon: Garlan Fair, MD;  Location: [facility];  Service: Endoscopy;  Laterality: N/A;   BREAST BIOPSY  11/01/2011   Procedure: BREAST BIOPSY WITH NEEDLE LOCALIZATION;  Surgeon: Adin Hector, MD;  Location: [place];  Service: General;  Laterality: Right;   BREAST LUMPECTOMY  1970   right breast- benign  COLONOSCOPY WITH PROPOFOL N/A 09/21/2015   Procedure: COLONOSCOPY WITH PROPOFOL;  Surgeon: Garlan Fair, MD;  Location: [facility];  Service: Endoscopy;  Laterality: N/A;   CYSTECTOMY     finger   ESOPHAGOGASTRODUODENOSCOPY N/A 11/12/2013   Procedure: ESOPHAGOGASTRODUODENOSCOPY (EGD);  Surgeon: Garlan Fair, MD;  Location: [facility];  Service: Endoscopy;   Laterality: N/A;   ESOPHAGOGASTRODUODENOSCOPY (EGD) WITH ESOPHAGEAL DILATION  07/03/2012   Procedure: ESOPHAGOGASTRODUODENOSCOPY (EGD) WITH ESOPHAGEAL DILATION;  Surgeon: Garlan Fair, MD;  Location: [facility];  Service: Endoscopy;  Laterality: N/A;   ESOPHAGOGASTRODUODENOSCOPY (EGD) WITH PROPOFOL N/A 09/21/2015   Procedure: ESOPHAGOGASTRODUODENOSCOPY (EGD) WITH PROPOFOL;  Surgeon: Garlan Fair, MD;  Location: [facility];  Service: Endoscopy;  Laterality: N/A;    Social History:  reports that she quit smoking about 53 years ago. Her smoking use included cigarettes. She has a 10.00 pack-year smoking history. She has never used smokeless tobacco. She reports that she does not drink alcohol and does not use drugs.  Allergies  Allergen Reactions   Amoxicillin-Pot Clavulanate Diarrhea   Codeine Nausea And Vomiting and Other (See Comments)    "drunk feeling and it puts me on the floor"    Simvastatin Other (See Comments)    "Made my legs hurt terribly bad"   Warfarin Other (See Comments)    History of "ulcerative colitis"   Warfarin Sodium Other (See Comments)    History of "ulcerative colitis" and "Blood thinning, so contraindicated"    Family History  Problem Relation Age of Onset   Heart disease Mother    Prostate cancer Brother    Diabetes Brother    Other Brother        unknown cause age 74   Colon cancer Neg Hx    Stomach cancer Neg Hx    Colon polyps Neg Hx    Esophageal cancer Neg Hx    Rectal cancer Neg Hx      Prior to Admission medications   Medication Sig Start Date End Date Taking? Authorizing Provider  cholecalciferol (VITAMIN D3) 25 MCG (1000 UNIT) tablet Take 1,000 Units by mouth daily.    [provider]  cyanocobalamin 1000 MCG tablet Take 1,000 mcg by mouth daily.    [provider]  hydrocortisone 2.5 % ointment daily as needed.    [provider]  mesalamine (LIALDA) 1.2 g EC tablet Take 4 tablets (4.8 g total) by mouth  daily with breakfast. You are overdue for an appointment. Please call to schedule asap. Appointment needed for further refills. 07/28/21   Yetta Flock, MD  methylPREDNISolone (MEDROL DOSEPAK) 4 MG TBPK tablet Take as directed 08/07/21   Horton, Alvin Critchley, DO  ondansetron (ZOFRAN-ODT) 4 MG disintegrating tablet 80m ODT q4 hours prn nausea/vomit 08/15/21   YDrenda Freeze MD  pantoprazole (PROTONIX) 20 MG tablet Take 1 tablet (20 mg total) by mouth daily. 02/10/21   AYetta Flock MD    Physical Exam: Vitals:   08/24/21 1831 08/24/21 2050 08/24/21 2345 08/25/21 0000  BP: (!) 115/47 (!) 119/51 (!) 129/51   Pulse: 86 82  80  Resp: 18 17 14 15   Temp: 99.7 F (37.6 C) 98.9 F (37.2 C)    TempSrc: Oral     SpO2: 96% 98%  100%  Weight:      Height:       Constitutional: Elderly woman resting supine in bed, NAD, calm, comfortable Eyes: PERRL, lids and conjunctivae normal ENMT: Mucous membranes are moist. Posterior  pharynx clear of any exudate or lesions.Normal dentition.  Neck: normal, supple, no masses. Respiratory: clear to auscultation bilaterally, no wheezing, no crackles. Normal respiratory effort. No accessory muscle use.  Cardiovascular: Regular rate and rhythm, no murmurs / rubs / gallops.  Mild left lower extremity edema. 2+ pedal pulses. Abdomen: no tenderness, no masses palpated. No hepatosplenomegaly. Bowel sounds positive.  Musculoskeletal: no clubbing / cyanosis. No joint deformity upper and lower extremities. Good ROM, no contractures. Normal muscle tone.  Skin: no rashes, lesions, ulcers. No induration Neurologic: CN 2-12 grossly intact. Sensation intact. Strength 5/5 in all 4.  Psychiatric: Normal judgment and insight. Alert and oriented x 3. Normal mood.   EKG: Not performed.  Assessment/Plan Principal Problem:   Acute deep vein thrombosis (DVT) of iliac vein of left lower extremity (HCC) Active Problems:   Left sided ulcerative colitis (HCC)   GERD  (gastroesophageal reflux disease)   Ashley Carrillo is a 81 y.o. female with medical history significant for HLD, GERD, history of LLE DVT, left-sided ulcerative colitis, history of GI bleeding, trigeminal neuralgia who is admitted with extensive left lower extremity DVT involving the iliac veins.  Started on IV heparin.  EDP discussed case with IR, considering vascular intervention.  Assessment and Plan: * Acute deep vein thrombosis (DVT) of iliac vein of left lower extremity (HCC)- (present on admission) Extensive acute LLE DVT involving the left common and external iliac veins, left common femoral, SF junction, left femoral, left proximal profunda, left popliteal, left posterior tibial, left peroneal, and left gastrocnemius veins. -Started on IV heparin -EDP discussed with IR who recommended CT venogram, may be candidate for vascular intervention  Left sided ulcerative colitis ([place])- (present on admission) Follows with GI, Dr. Havery [name].  Currently stable, continue Lialda.  GERD (gastroesophageal reflux disease)- (present on admission) Continue Protonix.  DVT prophylaxis:   Heparin drip Code Status: Full code, confirmed on admission Family Communication: Discussed with patient's husband at bedside Disposition Plan: Home home, dispo pending clinical progress Consults called: EDP discussed with IR, Dr. Maryelizabeth Kaufmann Severity of Illness: The appropriate patient status for this patient is OBSERVATION. Observation status is judged to be reasonable and necessary in order to provide the required intensity of service to ensure the patient's safety. The patient's presenting symptoms, physical exam findings, and initial radiographic and laboratory data in the context of their medical condition is felt to place them at decreased risk for further clinical deterioration. Furthermore, it is anticipated that the patient will be medically stable for discharge from the hospital within 2 midnights of admission.    Zada Finders MD [facility]  If 7PM-7AM, please contact night-coverage [email or web address]  08/25/2021, 12:11 AM

## 2021-08-24 NOTE — ED Provider Triage Note (Signed)
Emergency Medicine Provider Triage Evaluation Note  Ashley Carrillo , a 81 y.o. female  was evaluated in triage.  Pt complains of left lower extremity pain. She states pain is located in her left thigh. Pain is the same pain she felt when she had a DVT. She is not currently on any blood thinners. Denies chest pain and shortness of breath.   Review of Systems  Positive: Leg pain Negative: CP/SOB  Physical Exam  BP (!) 129/53 (BP Location: Left Arm)    Pulse 89    Temp 98.3 F (36.8 C) (Oral)    Resp 15    Ht 5\' 6"  (1.676 m)    Wt 59 kg    SpO2 99%    BMI 20.98 kg/m  Gen:   Awake, no distress   Resp:  Normal effort  MSK:   Moves extremities without difficulty  Other:  Tenderness throughout left thigh, pedal pulses palpable  Medical Decision Making  Medically screening exam initiated at 5:05 PM.  Appropriate orders placed.  [name] was informed that the remainder of the evaluation will be completed by another provider, this initial triage assessment does not replace that evaluation, and the importance of remaining in the ED until their evaluation is complete.  [place] to rule out DVT labs   Suzy Bouchard, [place] 08/24/21 1706

## 2021-08-24 NOTE — ED Triage Notes (Signed)
"  History of blood clots, noted she is having discoloration in left foot and concerned she may have a clot in it" per EMS

## 2021-08-24 NOTE — ED Provider Notes (Signed)
Ashley Carrillo Provider Note   CSN: [id number] Arrival date & time: 08/24/21  1639     History  Chief Complaint  Patient presents with   Foot Pain    Ashley Carrillo is a 81 y.o. female.   Foot Pain  Patient has history of hyperlipidemia, ulcerative colitis, trigeminal neuralgia carotid artery stenosis and prior DVT Patient states she started noticing swelling and discoloration of the left foot recently.  Patient has history of prior DVT and is concerned that she is developing another 1.  She denies any trouble with chest pain.  No shortness of breath.  No fevers or chills.  Home Medications Prior to Admission medications   Medication Sig Start Date End Date Taking? Authorizing Provider  cholecalciferol (VITAMIN D3) 25 MCG (1000 UNIT) tablet Take 1,000 Units by mouth daily.   Yes [provider]  cyanocobalamin 1000 MCG tablet Take 1,000 mcg by mouth daily.   Yes [provider]  hydrocortisone 2.5 % ointment Apply 1 application topically daily as needed (to affected area).   Yes [provider]  mesalamine (LIALDA) 1.2 g EC tablet Take 4 tablets (4.8 g total) by mouth daily with breakfast. You are overdue for an appointment. Please call to schedule asap. Appointment needed for further refills. Patient taking differently: Take 2.4 g by mouth 2 (two) times daily with a meal. 07/28/21  Yes Armbruster, Carlota Raspberry, MD  ondansetron (ZOFRAN-ODT) 4 MG disintegrating tablet 18m ODT q4 hours prn nausea/vomit Patient taking differently: Take 4 mg by mouth every 4 (four) hours as needed for nausea or vomiting (dissolve orally). 08/15/21  Yes YDrenda Freeze MD  pantoprazole (PROTONIX) 20 MG tablet Take 1 tablet (20 mg total) by mouth daily. Patient taking differently: Take 20 mg by mouth daily before breakfast. 02/10/21  Yes Armbruster, SCarlota Raspberry MD      Allergies    Amoxicillin-pot clavulanate, Codeine, Simvastatin, Warfarin, and  Warfarin sodium    Review of Systems   Review of Systems  Constitutional:  Negative for fever.   Physical Exam Updated Vital Signs BP (!) 119/51 (BP Location: Left Arm)    Pulse 82    Temp 98.9 F (37.2 C)    Resp 17    Ht 1.676 m (5' 6" )    Wt 59 kg    SpO2 98%    BMI 20.98 kg/m  Physical Exam Vitals and nursing note reviewed.  Constitutional:      General: She is not in acute distress.    Appearance: She is well-developed.  HENT:     Head: Normocephalic and atraumatic.     Right Ear: External ear normal.     Left Ear: External ear normal.  Eyes:     General: No scleral icterus.       Right eye: No discharge.        Left eye: No discharge.     Conjunctiva/sclera: Conjunctivae normal.  Neck:     Trachea: No tracheal deviation.  Cardiovascular:     Rate and Rhythm: Normal rate and regular rhythm.  Pulmonary:     Effort: Pulmonary effort is normal. No respiratory distress.     Breath sounds: Normal breath sounds. No stridor. No wheezing or rales.  Abdominal:     General: Bowel sounds are normal. There is no distension.     Palpations: Abdomen is soft.     Tenderness: There is no abdominal tenderness. There is no guarding or rebound.  Musculoskeletal:  General: Tenderness present. No deformity.     Cervical back: Neck supple.     Left lower leg: Edema present.  Skin:    General: Skin is warm and dry.     Findings: No rash.  Neurological:     General: No focal deficit present.     Mental Status: She is alert.     Cranial Nerves: No cranial nerve deficit (no facial droop, extraocular movements intact, no slurred speech).     Sensory: No sensory deficit.     Motor: No abnormal muscle tone or seizure activity.     Coordination: Coordination normal.  Psychiatric:        Mood and Affect: Mood normal.    ED Results / Procedures / Treatments   Labs (all labs ordered are listed, but only abnormal results are displayed) Labs Reviewed  CBC WITH DIFFERENTIAL/PLATELET  - Abnormal; Notable for the following components:      Result Value   WBC 15.5 (*)    Neutro Abs 12.3 (*)    Monocytes Absolute 1.4 (*)    Abs Immature Granulocytes 0.10 (*)    All other components within normal limits  BASIC METABOLIC PANEL - Abnormal; Notable for the following components:   Sodium 131 (*)    Chloride 96 (*)    Glucose, Bld 125 (*)    Calcium 8.5 (*)    All other components within normal limits  RESP PANEL BY RT-PCR (FLU A&B, COVID) [id number]  CBC  HEPARIN LEVEL (UNFRACTIONATED)  BASIC METABOLIC PANEL    EKG None  Radiology VAS [place] IVC/ILIAC (VENOUS ONLY)  Result Date: 08/24/2021 IVC/ILIAC STUDY Patient Name:  Ashley Carrillo  Date of Exam:   08/24/2021 Medical Rec #: [id number]        Accession #:    [id number] Date of Birth: May 04, 1941        Patient Gender: F Patient Age:   68 years Exam Location:  [facility] Procedure:      VAS [place] IVC/ILIAC (VENOUS ONLY) Referring Phys: Wille Glaser Yareni Creps --------------------------------------------------------------------------------  Indications: Extensive acute DVT of the left lower extremity with extension              above the inguinal ligament Limitations: Air/bowel gas.  Comparison Study: 08-24-2021 Left lower extremity venous duplex study Performing Technologist: Darlin Coco RDMS, RVT  Examination Guidelines: A complete evaluation includes B-mode imaging, spectral Doppler, color Doppler, and power Doppler as needed of all accessible portions of each vessel. Bilateral testing is considered an integral part of a complete examination. Limited examinations for reoccurring indications may be performed as noted.  IVC/Iliac Findings: +----------+------+--------+--------+     IVC     Patent Thrombus Comments  +----------+------+--------+--------+  IVC Prox   patent                    +----------+------+--------+--------+  IVC Mid    patent                    +----------+------+--------+--------+  IVC Distal patent                     +----------+------+--------+--------+  +-------------------+---------+-----------+---------+-----------+--------+          CIV         RT-Patent RT-Thrombus LT-Patent LT-Thrombus Comments  +-------------------+---------+-----------+---------+-----------+--------+  Common Iliac Prox  acute              +-------------------+---------+-----------+---------+-----------+--------+  Common Iliac Mid                                       acute              +-------------------+---------+-----------+---------+-----------+--------+  Common Iliac Distal                                    acute              +-------------------+---------+-----------+---------+-----------+--------+  +-------------------------+---------+-----------+---------+-----------+--------+             EIV            RT-Patent RT-Thrombus LT-Patent LT-Thrombus Comments  +-------------------------+---------+-----------+---------+-----------+--------+  External Iliac Vein Prox                                     acute              +-------------------------+---------+-----------+---------+-----------+--------+  External Iliac Vein Mid                                      acute              +-------------------------+---------+-----------+---------+-----------+--------+  External Iliac Vein                                          acute               Distal                                                                          +-------------------------+---------+-----------+---------+-----------+--------+   Summary: IVC/Iliac: There is no evidence of thrombus involving the IVC. There is evidence of acute thrombus involving the left common iliac vein. There is evidence of acute thrombus involving the left external iliac vein. Visualized segments of the right iliac veins appear patent.  *See table(s) above for measurements and observations.   Preliminary    CT VENOGRAM ABD/PEL  Result Date: 08/24/2021 CLINICAL DATA:   History of blood clots, left foot discoloration, deep venous thrombosis EXAM: CT VENOGRAM ABDOMEN AND PELVIS TECHNIQUE: Multidetector CT imaging of the abdomen was performed using the standard protocol following bolus administration of intravenous contrast, with imaging performed during the portal venous and delayed venous phases of contrast enhancement. RADIATION DOSE REDUCTION: This exam was performed according to the departmental dose-optimization program which includes automated exposure control, adjustment of the mA and/or kV according to patient size and/or use of iterative reconstruction technique. CONTRAST:  135m OMNIPAQUE IOHEXOL 350 MG/ML SOLN COMPARISON:  None. FINDINGS: Lower chest: No acute pleural or parenchymal lung disease. Small hiatal hernia. Hepatobiliary: No focal liver abnormality is seen. No gallstones, gallbladder wall thickening, or biliary dilatation.  Pancreas: Unremarkable. No pancreatic ductal dilatation or surrounding inflammatory changes. Spleen: Normal in size without focal abnormality. Adrenals/Urinary Tract: There are bilateral peripelvic renal cysts. Kidneys enhance normally and symmetrically. No urinary tract calculi or obstruction. Bladder is unremarkable. Adrenals are normal. Stomach/Bowel: No bowel obstruction or ileus. No bowel wall thickening or inflammatory change. Vascular/Lymphatic: There is enlargement and occlusive thrombus involving the left left common iliac, external iliac, common femoral, and visualized portions of the superficial and profundus femoral veins, consistent with acute DVT. Diffuse atherosclerosis of the aorta. No pathologic adenopathy of the abdomen or pelvis. Reproductive: Uterus and bilateral adnexa are unremarkable. Other: No free fluid or free gas.  No abdominal wall hernia. Musculoskeletal: There is diffuse subcutaneous edema within the visualized left lower extremity consistent with acute DVT. No acute or destructive bony lesions. 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 Reconstructed images demonstrate no additional findings. IMPRESSION: 1. Acute occlusive deep venous thrombosis involving the left common iliac, external iliac, common femoral, femoral, and profundus femoral veins as above. 2. Subcutaneous edema throughout the left lower extremity. 3. Small hiatal hernia. 4.  Aortic Atherosclerosis (ICD10-I70.0). Critical Value/emergent results were called by telephone at the time of interpretation on 08/24/2021 at 10:38 pm to provider [facility] , who verbally acknowledged these results. Electronically Signed   By: Randa Ngo M.D.   On: 08/24/2021 22:39   VAS [place] LOWER EXTREMITY VENOUS (DVT) (7a-7p)  Result Date: 08/24/2021  Lower Venous DVT Study Patient Name:  TIFFONY KITE  Date of Exam:   08/24/2021 Medical Rec #: [id number]        Accession #:    [id number] Date of Birth: [birth year]        Patient Gender: F Patient Age:   81 years Exam Location:  [facility] Procedure:      VAS [place] LOWER EXTREMITY VENOUS (DVT) Referring Phys: Charmaine Downs --------------------------------------------------------------------------------  Indications: History of DVT of the left lower extremity, recent Covid-19 (08/10/21), pain, swelling, and discoloration to left lower extremity.  Comparison Study: No prior studies available. Performing Technologist: Darlin Coco RDMS, RVT  Examination Guidelines: A complete evaluation includes B-mode imaging, spectral Doppler, color Doppler, and power Doppler as needed of all accessible portions of each vessel. Bilateral testing is considered an integral part of a complete examination. Limited examinations for reoccurring indications may be performed as noted. The reflux portion of the exam is performed with the patient in reverse Trendelenburg.  +-----+---------------+---------+-----------+----------+--------------+  RIGHT Compressibility Phasicity Spontaneity Properties Thrombus Aging   +-----+---------------+---------+-----------+----------+--------------+  CFV   Full            Yes       Yes                                    +-----+---------------+---------+-----------+----------+--------------+   +---------+---------------+---------+-----------+----------+--------------+  LEFT      Compressibility Phasicity Spontaneity Properties Thrombus Aging  +---------+---------------+---------+-----------+----------+--------------+  CFV       None            No        No                     Acute           +---------+---------------+---------+-----------+----------+--------------+  SFJ       None            No  No                     Acute           +---------+---------------+---------+-----------+----------+--------------+  FV Prox   None            No        No                     Acute           +---------+---------------+---------+-----------+----------+--------------+  FV Mid    None            No        No                     Acute           +---------+---------------+---------+-----------+----------+--------------+  FV Distal None            No        No                     Acute           +---------+---------------+---------+-----------+----------+--------------+  PFV       None            No        No                     Acute           +---------+---------------+---------+-----------+----------+--------------+  POP       None            No        No                     Acute           +---------+---------------+---------+-----------+----------+--------------+  PTV       Partial         Yes       Yes                    Acute           +---------+---------------+---------+-----------+----------+--------------+  PERO      Partial         Yes       Yes                    Acute           +---------+---------------+---------+-----------+----------+--------------+  Gastroc   None            No        No                     Acute            +---------+---------------+---------+-----------+----------+--------------+  EIV       None            No        No                     Acute           +---------+---------------+---------+-----------+----------+--------------+    Summary: RIGHT: - No evidence of common femoral vein obstruction.  LEFT: - Findings consistent with acute deep vein thrombosis involving the left common femoral vein, SF junction, left femoral vein, left proximal profunda vein, left popliteal vein, left  posterior tibial veins, left peroneal veins, and left gastrocnemius veins.  - Possible obstruction proximal to the inguinal ligament.  - A cystic structure is found in the popliteal fossa.  *See table(s) above for measurements and observations.    Preliminary     Procedures .Critical Care Performed by: Dorie Rank, MD Authorized by: Dorie Rank, MD   Critical care provider statement:    Critical care time (minutes):  30   Critical care was time spent personally by me on the following activities:  Development of treatment plan with patient or surrogate, discussions with consultants, evaluation of patient's response to treatment, examination of patient, ordering and review of laboratory studies, ordering and review of radiographic studies, ordering and performing treatments and interventions, pulse oximetry, re-evaluation of patient's condition and review of old charts    Medications Ordered in ED Medications  heparin ADULT infusion 100 units/mL (25000 units/270m) (950 Units/hr Intravenous New Bag/Given 08/24/21 2239)  sodium chloride flush (NS) 0.9 % injection 3 mL (has no administration in time range)  acetaminophen (TYLENOL) tablet 650 mg (has no administration in time range)    Or  acetaminophen (TYLENOL) suppository 650 mg (has no administration in time range)  ondansetron (ZOFRAN) tablet 4 mg (has no administration in time range)    Or  ondansetron (ZOFRAN) injection 4 mg (has no administration in time range)   senna-docusate (Senokot-S) tablet 1 tablet (has no administration in time range)  mesalamine (LIALDA) EC tablet 2.4 g (has no administration in time range)  pantoprazole (PROTONIX) EC tablet 20 mg (has no administration in time range)  heparin bolus via infusion 2,000 Units (2,000 Units Intravenous Bolus from Bag 08/24/21 2240)  iohexol (OMNIPAQUE) 350 MG/ML injection 100 mL (100 mLs Intravenous Contrast Given 08/24/21 2211)    ED Course/ Medical Decision Making/ A&P Clinical Course as of 08/24/21 2349  Tue Aug 24, 2021  2025 CBC with Differential(!) CBC shows leukocytosis with a white count of 15.5.  No anemia [JK]  26754Basic metabolic panel(!) Metabolic panel is normal. [JK]  2025 Findings consistent with acute deep vein thrombosis involving the left common femoral vein, SF junction, left femoral vein, left proximal profunda vein, left popliteal vein, left posterior tibial veins, left peroneal veins, and left gastrocnemius  veins.   [JK]  2026 Doppler study shows DVT of left common iliac down.  No involvement IVC [JK]  2043 Discussed with Dr MMaryelizabeth Kaufmann[JK]  2114 Discussed case with Dr. PPosey Prontoregarding admission [JK]    Clinical Course User Index [JK] KDorie Rank MD                           Medical Decision Making Amount and/or Complexity of Data Reviewed Labs: ordered. Decision-making details documented in ED Course. Radiology: ordered. ECG/medicine tests: ordered.  Risk Prescription drug management. Decision regarding hospitalization.   Patient has evidence of swelling in her left lower extremity.  Presentation concerning for possible DVT.  Doppler study was performed and it shows DVT involving her left iliac all the way down.  Involvement of the femoral vein left proximal profunda vein, left popliteal vein, left posterior tibial veins and left peroneal and gastrocnemius veins.  Discussed starting the patient on anticoagulation.  She is very concerned about this as she has  history of GI bleeding she states in the past when she was on anticoagulation with her previous DVT.  Patient states she is on blood thinners for a couple of months and  then started having GI bleeding.  They stopped her medications.  Patient states the clot had resolved so they just discontinued her medications and she was not started on any other treatment.  Considering her prior GI bleed I will start her on short acting heparin so that we can monitor her closely.  I did discuss the case with interventional radiology Dr. Wilfred Lacy.  Patient may very well be a candidate for vascular intervention.  He requests adding on a CT venogram.  I will consult with the medical service for admission.        Final Clinical Impression(s) / ED Diagnoses Final diagnoses:  Acute deep vein thrombosis (DVT) of proximal vein of left lower extremity ([place])  Ulcerative colitis without complications, unspecified location [facility])     Dorie Rank, MD 08/24/21 2350

## 2021-08-24 NOTE — Progress Notes (Signed)
Lower extremity venous left and IVC/iliac study completed.  Preliminary results relayed to Tomi Bamberger, MD and [place], [place] via secure chat.  See CV Proc for preliminary results report.   Darlin Coco, RDMS, RVT

## 2021-08-24 NOTE — Progress Notes (Signed)
[place] for heparin Indication: acute DVT  Allergies  Allergen Reactions   Amoxicillin-Pot Clavulanate Diarrhea   Codeine Nausea And Vomiting and Other (See Comments)    "drunk feeling and it puts me on the floor";   Simvastatin Other (See Comments)   Warfarin Other (See Comments)   Warfarin Sodium Other (See Comments)    Blood thinning -- contra-indicated    Patient Measurements: Height: 5\' 6"  (167.6 cm) Weight: 59 kg (130 lb) IBW/kg (Calculated) : 59.3 Heparin Dosing Weight: 59 kg  Vital Signs: Temp: 98.9 F (37.2 C) (02/07 2050) Temp Source: Oral (02/07 1831) BP: 119/51 (02/07 2050) Pulse Rate: 82 (02/07 2050)  Labs: Recent Labs    08/24/21 1726  HGB 12.0  HCT 36.4  PLT 154  CREATININE 0.89    Estimated Creatinine Clearance: 46.2 mL/min (by C-G formula based on SCr of 0.89 mg/dL).    Assessment: Patient is an 81 y.o F who was COVID+ on 08/10/21, presented to the ED on 08/24/21 with c/o discoloration of left foot.  LE doppler on 08/24/21 showed extensive acute LLE DVT.  IVC/iliac doppler also showed acute thrombus involving the left common and external iliac veins.  Pharmacy has been consulted to start heparin drip for VTE treatment.   Goal of Therapy:  Heparin level 0.3-0.7 units/ml Monitor platelets by anticoagulation protocol: Yes   Plan:  - heparin 2000 units IV x1 bolus, then drip at 950 units/hr - check 8 hr heparin level - monitor for s/sx bleeding  Lynelle Doctor 08/24/2021,9:04 PM

## 2021-08-24 NOTE — Assessment & Plan Note (Signed)
Follows with GI, Dr. Havery [name].  Currently stable, continue Lialda.

## 2021-08-25 DIAGNOSIS — Z833 Family history of diabetes mellitus: Secondary | ICD-10-CM | POA: Diagnosis not present

## 2021-08-25 DIAGNOSIS — I824Y2 Acute embolism and thrombosis of unspecified deep veins of left proximal lower extremity: Secondary | ICD-10-CM

## 2021-08-25 DIAGNOSIS — K5150 Left sided colitis without complications: Secondary | ICD-10-CM

## 2021-08-25 DIAGNOSIS — Z8616 Personal history of COVID-19: Secondary | ICD-10-CM | POA: Diagnosis not present

## 2021-08-25 DIAGNOSIS — Z79899 Other long term (current) drug therapy: Secondary | ICD-10-CM | POA: Diagnosis not present

## 2021-08-25 DIAGNOSIS — Z7401 Bed confinement status: Secondary | ICD-10-CM | POA: Diagnosis not present

## 2021-08-25 DIAGNOSIS — K5190 Ulcerative colitis, unspecified, without complications: Secondary | ICD-10-CM | POA: Diagnosis not present

## 2021-08-25 DIAGNOSIS — I82409 Acute embolism and thrombosis of unspecified deep veins of unspecified lower extremity: Secondary | ICD-10-CM | POA: Diagnosis present

## 2021-08-25 DIAGNOSIS — E785 Hyperlipidemia, unspecified: Secondary | ICD-10-CM | POA: Diagnosis present

## 2021-08-25 DIAGNOSIS — K219 Gastro-esophageal reflux disease without esophagitis: Secondary | ICD-10-CM | POA: Diagnosis present

## 2021-08-25 DIAGNOSIS — I82412 Acute embolism and thrombosis of left femoral vein: Secondary | ICD-10-CM | POA: Diagnosis present

## 2021-08-25 DIAGNOSIS — I82452 Acute embolism and thrombosis of left peroneal vein: Secondary | ICD-10-CM | POA: Diagnosis present

## 2021-08-25 DIAGNOSIS — Z87891 Personal history of nicotine dependence: Secondary | ICD-10-CM | POA: Diagnosis not present

## 2021-08-25 DIAGNOSIS — Z8042 Family history of malignant neoplasm of prostate: Secondary | ICD-10-CM | POA: Diagnosis not present

## 2021-08-25 DIAGNOSIS — I82432 Acute embolism and thrombosis of left popliteal vein: Secondary | ICD-10-CM | POA: Diagnosis present

## 2021-08-25 DIAGNOSIS — I82422 Acute embolism and thrombosis of left iliac vein: Secondary | ICD-10-CM | POA: Diagnosis present

## 2021-08-25 DIAGNOSIS — I871 Compression of vein: Secondary | ICD-10-CM | POA: Diagnosis present

## 2021-08-25 DIAGNOSIS — I82462 Acute embolism and thrombosis of left calf muscular vein: Secondary | ICD-10-CM | POA: Diagnosis present

## 2021-08-25 DIAGNOSIS — Z86718 Personal history of other venous thrombosis and embolism: Secondary | ICD-10-CM | POA: Diagnosis not present

## 2021-08-25 DIAGNOSIS — Z8249 Family history of ischemic heart disease and other diseases of the circulatory system: Secondary | ICD-10-CM | POA: Diagnosis not present

## 2021-08-25 DIAGNOSIS — I82402 Acute embolism and thrombosis of unspecified deep veins of left lower extremity: Secondary | ICD-10-CM | POA: Diagnosis not present

## 2021-08-25 DIAGNOSIS — I82442 Acute embolism and thrombosis of left tibial vein: Secondary | ICD-10-CM | POA: Diagnosis present

## 2021-08-25 LAB — BASIC METABOLIC PANEL
Anion gap: 8 (ref 5–15)
BUN: 17 mg/dL (ref 8–23)
CO2: 26 mmol/L (ref 22–32)
Calcium: 8.6 mg/dL — ABNORMAL LOW (ref 8.9–10.3)
Chloride: 98 mmol/L (ref 98–111)
Creatinine, Ser: 0.75 mg/dL (ref 0.44–1.00)
GFR, Estimated: >60 mL/min (ref >60)
Glucose, Bld: 91 mg/dL (ref 70–99)
Potassium: 4.2 mmol/L (ref 3.5–5.1)
Sodium: 132 mmol/L — ABNORMAL LOW (ref 135–145)

## 2021-08-25 LAB — CBC
HCT: 34.4 % — ABNORMAL LOW (ref 36.0–46.0)
Hemoglobin: 11.4 g/dL — ABNORMAL LOW (ref 12.0–15.0)
MCH: 29.8 pg (ref 26.0–34.0)
MCHC: 33.1 g/dL (ref 30.0–36.0)
MCV: 89.8 fL (ref 80.0–100.0)
Platelets: 145 10*3/uL — ABNORMAL LOW (ref 150–400)
RBC: 3.83 MIL/uL — ABNORMAL LOW (ref 3.87–5.11)
RDW: 14.4 % (ref 11.5–15.5)
WBC: 11.5 10*3/uL — ABNORMAL HIGH (ref 4.0–10.5)
nRBC: 0 % (ref 0.0–0.2)

## 2021-08-25 LAB — HEPARIN LEVEL (UNFRACTIONATED)
Heparin Unfractionated: 0.74 IU/mL — ABNORMAL HIGH (ref 0.30–0.70)
Heparin Unfractionated: 0.22 IU/mL — ABNORMAL LOW (ref 0.30–0.70)

## 2021-08-25 MED ORDER — HEPARIN (PORCINE) 25000 UT/250ML-% IV SOLN
900.0000 [IU]/h | INTRAVENOUS | Status: DC
  Administered 2021-08-25: 850 [IU]/h via INTRAVENOUS
  Administered 2021-08-26 – 2021-08-28 (×3): 900 [IU]/h via INTRAVENOUS
  Filled 2021-08-25 (×3): qty 250

## 2021-08-25 NOTE — Progress Notes (Addendum)
[facility] PROGRESS NOTE    Progress Note  Ashley Carrillo  SUP:[id number] DOB: 19-Mar-1941 DOA: 08/24/2021 PCP: [name], MD     Brief Narrative:   Ashley Carrillo is an 81 y.o. female past medical history significant for hyperlipidemia, history of left lower extremity DVT, not on anticoagulation left-sided ulcerative colitis, history of GI bleed trigeminal neuralgia presents for evaluation of left lower extremity swelling and discoloration, she relates her last flare of ulcerative colitis was several years ago, she has been asymptomatic for some time now.     Assessment/Plan:   Acute deep vein thrombosis (DVT) of iliac vein of left lower extremity [facility]): Lower extremity Doppler showed extensive acute lower extremity DVT in the left common and external iliac vein on the left. Started on IV heparin. IR was consulted recommended a CT venogram which showed acute occlusive DVT of the left common iliac external, femoral profundus vein subcutaneous edema small hiatal hernia. Awaiting IR further recommendations. Ulcerative colitis flares.  At risk of developing DVT.  Left sided ulcerative colitis: Currently stable on Lialda is at high risk of GI bleed being on anticoagulation.  GERD: Continue Protonix.    DVT prophylaxis: heparin Family Communication:none Status is: Inpatient The patient will require care spanning > 2 midnights and should be moved to inpatient because: Acute DVT with previous history of GI bleed due to ulcerative colitis started on IV heparin IR consulted for thrombectomy     Code Status:     Code Status Orders  (From admission, onward)           Start     Ordered   08/24/21 2320  Full code  Continuous        08/24/21 2320           Code Status History     This patient has a current code status but no historical code status.         IV Access:   Peripheral IV   Procedures and diagnostic studies:   VAS [place] IVC/ILIAC  (VENOUS ONLY)  Result Date: 08/24/2021 IVC/ILIAC STUDY Patient Name:  Ashley Carrillo  Date of Exam:   08/24/2021 Medical Rec #: [id number]        Accession #:    [id number] Date of Birth: 1941/05/11        Patient Gender: F Patient Age:   68 years Exam Location:  [facility] Procedure:      VAS [place] IVC/ILIAC (VENOUS ONLY) Referring Phys: Wille Glaser KNAPP --------------------------------------------------------------------------------  Indications: Extensive acute DVT of the left lower extremity with extension              above the inguinal ligament Limitations: Air/bowel gas.  Comparison Study: 08-24-2021 Left lower extremity venous duplex study Performing Technologist: Darlin Coco RDMS, RVT  Examination Guidelines: A complete evaluation includes B-mode imaging, spectral Doppler, color Doppler, and power Doppler as needed of all accessible portions of each vessel. Bilateral testing is considered an integral part of a complete examination. Limited examinations for reoccurring indications may be performed as noted.  IVC/Iliac Findings: +----------+------+--------+--------+     IVC     Patent Thrombus Comments  +----------+------+--------+--------+  IVC Prox   patent                    +----------+------+--------+--------+  IVC Mid    patent                    +----------+------+--------+--------+  IVC  Distal patent                    +----------+------+--------+--------+  +-------------------+---------+-----------+---------+-----------+--------+          CIV         RT-Patent RT-Thrombus LT-Patent LT-Thrombus Comments  +-------------------+---------+-----------+---------+-----------+--------+  Common Iliac Prox                                      acute              +-------------------+---------+-----------+---------+-----------+--------+  Common Iliac Mid                                       acute              +-------------------+---------+-----------+---------+-----------+--------+  Common Iliac Distal                                     acute              +-------------------+---------+-----------+---------+-----------+--------+  +-------------------------+---------+-----------+---------+-----------+--------+             EIV            RT-Patent RT-Thrombus LT-Patent LT-Thrombus Comments  +-------------------------+---------+-----------+---------+-----------+--------+  External Iliac Vein Prox                                     acute              +-------------------------+---------+-----------+---------+-----------+--------+  External Iliac Vein Mid                                      acute              +-------------------------+---------+-----------+---------+-----------+--------+  External Iliac Vein                                          acute               Distal                                                                          +-------------------------+---------+-----------+---------+-----------+--------+   Summary: IVC/Iliac: There is no evidence of thrombus involving the IVC. There is evidence of acute thrombus involving the left common iliac vein. There is evidence of acute thrombus involving the left external iliac vein. Visualized segments of the right iliac veins appear patent.  *See table(s) above for measurements and observations.   Preliminary    CT VENOGRAM ABD/PEL  Result Date: 08/24/2021 CLINICAL DATA:  History of blood clots, left foot discoloration, deep venous thrombosis EXAM: CT VENOGRAM ABDOMEN AND PELVIS TECHNIQUE: Multidetector CT imaging of the abdomen was performed using the standard protocol following bolus administration  of intravenous contrast, with imaging performed during the portal venous and delayed venous phases of contrast enhancement. RADIATION DOSE REDUCTION: This exam was performed according to the departmental dose-optimization program which includes automated exposure control, adjustment of the mA and/or kV according to patient size and/or use of iterative  reconstruction technique. CONTRAST:  152m OMNIPAQUE IOHEXOL 350 MG/ML SOLN COMPARISON:  None. FINDINGS: Lower chest: No acute pleural or parenchymal lung disease. Small hiatal hernia. Hepatobiliary: No focal liver abnormality is seen. No gallstones, gallbladder wall thickening, or biliary dilatation. Pancreas: Unremarkable. No pancreatic ductal dilatation or surrounding inflammatory changes. Spleen: Normal in size without focal abnormality. Adrenals/Urinary Tract: There are bilateral peripelvic renal cysts. Kidneys enhance normally and symmetrically. No urinary tract calculi or obstruction. Bladder is unremarkable. Adrenals are normal. Stomach/Bowel: No bowel obstruction or ileus. No bowel wall thickening or inflammatory change. Vascular/Lymphatic: There is enlargement and occlusive thrombus involving the left left common iliac, external iliac, common femoral, and visualized portions of the superficial and profundus femoral veins, consistent with acute DVT. Diffuse atherosclerosis of the aorta. No pathologic adenopathy of the abdomen or pelvis. Reproductive: Uterus and bilateral adnexa are unremarkable. Other: No free fluid or free gas.  No abdominal wall hernia. Musculoskeletal: There is diffuse subcutaneous edema within the visualized left lower extremity consistent with acute DVT. No acute or destructive bony lesions. 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 Reconstructed images demonstrate no additional findings. IMPRESSION: 1. Acute occlusive deep venous thrombosis involving the left common iliac, external iliac, common femoral, femoral, and profundus femoral veins as above. 2. Subcutaneous edema throughout the left lower extremity. 3. Small hiatal hernia. 4.  Aortic Atherosclerosis (ICD10-I70.0). Critical Value/emergent results were called by telephone at the time of interpretation on 08/24/2021 at 10:38 pm to [facility], who verbally acknowledged these results.  Electronically Signed   By: MRanda NgoM.D.   On: 08/24/2021 22:39   VAS [place]OWER EXTREMITY VENOUS (DVT) (7a-7p)  Result Date: 08/24/2021  Lower Venous DVT Study Patient Name:  Ashley Carrillo Date of Exam:   08/24/2021 Medical Rec #: [id number]       Accession #:    [id number] of Birth: 808/25/1942       Patient Gender: F Patient Age:   83years [facility]:      [facility] [place] EXTREMITY VENOUS (DVT) Referring Phys: CCharmaine Downs--------------------------------------------------------------------------------  Indications: History of DVT of the left lower extremity, recent Covid-19 (08/10/21), pain, swelling, and discoloration to left lower extremity.  Comparison Study: No prior studies available. Performing Technologist: RDarlin CocoRDMS, RVT  Examination Guidelines: A complete evaluation includes B-mode imaging, spectral Doppler, color Doppler, and power Doppler as needed of all accessible portions of each vessel. Bilateral testing is considered an integral part of a complete examination. Limited examinations for reoccurring indications may be performed as noted. The reflux portion of the exam is performed with the patient in reverse Trendelenburg.  +-----+---------------+---------+-----------+----------+--------------+  RIGHT Compressibility Phasicity Spontaneity Properties Thrombus Aging  +-----+---------------+---------+-----------+----------+--------------+  CFV   Full            Yes       Yes                                    +-----+---------------+---------+-----------+----------+--------------+   +---------+---------------+---------+-----------+----------+--------------+  LEFT      Compressibility  Phasicity Spontaneity Properties Thrombus Aging  +---------+---------------+---------+-----------+----------+--------------+  CFV       None            No        No                     Acute            +---------+---------------+---------+-----------+----------+--------------+  SFJ       None            No        No                     Acute           +---------+---------------+---------+-----------+----------+--------------+  FV Prox   None            No        No                     Acute           +---------+---------------+---------+-----------+----------+--------------+  FV Mid    None            No        No                     Acute           +---------+---------------+---------+-----------+----------+--------------+  FV Distal None            No        No                     Acute           +---------+---------------+---------+-----------+----------+--------------+  PFV       None            No        No                     Acute           +---------+---------------+---------+-----------+----------+--------------+  POP       None            No        No                     Acute           +---------+---------------+---------+-----------+----------+--------------+  PTV       Partial         Yes       Yes                    Acute           +---------+---------------+---------+-----------+----------+--------------+  PERO      Partial         Yes       Yes                    Acute           +---------+---------------+---------+-----------+----------+--------------+  Gastroc   None            No        No                     Acute           +---------+---------------+---------+-----------+----------+--------------+  EIV  None            No        No                     Acute           +---------+---------------+---------+-----------+----------+--------------+    Summary: RIGHT: - No evidence of common femoral vein obstruction.  LEFT: - Findings consistent with acute deep vein thrombosis involving the left common femoral vein, SF junction, left femoral vein, left proximal profunda vein, left popliteal vein, left posterior tibial veins, left peroneal veins, and left gastrocnemius veins.  - Possible  obstruction proximal to the inguinal ligament.  - A cystic structure is found in the popliteal fossa.  *See table(s) above for measurements and observations.    Preliminary      Medical Consultants:   None.   Subjective:    Ashley Carrillo relates she still has left lower extremity pain  Objective:    Vitals:   08/25/21 0615 08/25/21 0630 08/25/21 0645 08/25/21 0700  BP: (!) 106/54 (!) 113/56 110/71 (!) 109/59  Pulse: 77 77 81 80  Resp: 18 12 18 20   Temp:      TempSrc:      SpO2: 96% 97% 92% 93%  Weight:      Height:       SpO2: 93 %  No intake or output data in the 24 hours ending 08/25/21 0759 Filed Weights   08/24/21 1647  Weight: 59 kg    Exam: General exam: In no acute distress. Respiratory system: Good air movement and clear to auscultation. Cardiovascular system: S1 & S2 heard, RRR. No JVD. Gastrointestinal system: Abdomen is nondistended, soft and nontender.  Extremities: No pedal edema. Skin: No rashes, lesions or ulcers Psychiatry: Judgement and insight appear normal. Mood & affect appropriate.    Data Reviewed:    Labs: Basic Metabolic Panel: Recent Labs  Lab 08/24/21 1726 08/25/21 0634  NA 131* 132*  K 4.1 4.2  CL 96* 98  CO2 26 26  GLUCOSE 125* 91  BUN 20 17  CREATININE 0.89 0.75  CALCIUM 8.5* 8.6*   GFR Estimated Creatinine Clearance: 51.4 mL/min (by C-G formula based on SCr of 0.75 mg/dL). Liver Function Tests: No results for input(s): AST, ALT, ALKPHOS, BILITOT, PROT, ALBUMIN in the last 168 hours. No results for input(s): LIPASE, AMYLASE in the last 168 hours. No results for input(s): AMMONIA in the last 168 hours. Coagulation profile No results for input(s): INR, PROTIME in the last 168 hours. COVID-19 Labs  No results for input(s): DDIMER, FERRITIN, LDH, CRP in the last 72 hours.  Lab Results  Component Value Date   SARSCOV2NAA NEGATIVE 08/24/2021   [place] NEGATIVE 08/07/2021    CBC: Recent Labs  Lab  08/24/21 1726 08/25/21 0634  WBC 15.5* 11.5*  NEUTROABS 12.3*  --   HGB 12.0 11.4*  HCT 36.4 34.4*  MCV 90.3 89.8  PLT 154 145*   Cardiac Enzymes: No results for input(s): CKTOTAL, CKMB, CKMBINDEX, TROPONINI in the last 168 hours. BNP (last 3 results) No results for input(s): PROBNP in the last 8760 hours. CBG: No results for input(s): GLUCAP in the last 168 hours. D-Dimer: No results for input(s): DDIMER in the last 72 hours. Hgb A1c: No results for input(s): HGBA1C in the last 72 hours. Lipid Profile: No results for input(s): CHOL, HDL, LDLCALC, TRIG, CHOLHDL, LDLDIRECT in the last 72 hours. Thyroid function studies: No results for input(s): TSH,  T4TOTAL, T3FREE, THYROIDAB in the last 72 hours.  Invalid input(s): FREET3 Anemia work up: No results for input(s): VITAMINB12, FOLATE, FERRITIN, TIBC, IRON, RETICCTPCT in the last 72 hours. Sepsis Labs: Recent Labs  Lab 08/24/21 1726 08/25/21 0634  WBC 15.5* 11.5*   Microbiology Recent Results (from the past 240 hour(s))  Resp Panel by RT-PCR (Flu A&B, Covid) Nasopharyngeal Swab     Status: None   Collection Time: 08/24/21  8:55 PM   Specimen: Nasopharyngeal Swab; Nasopharyngeal(NP) swabs in vial transport medium  Result Value Ref Range Status   SARS Coronavirus 2 by RT PCR NEGATIVE NEGATIVE Final    Comment: (NOTE) SARS-CoV-2 target nucleic acids are NOT DETECTED.  The SARS-CoV-2 RNA is generally detectable in upper respiratory specimens during the acute phase of infection. The lowest concentration of SARS-CoV-2 viral copies this assay can detect is 138 copies/mL. A negative result does not preclude SARS-Cov-2 infection and should not be used as the sole basis for treatment or other patient management decisions. A negative result may occur with  improper specimen collection/handling, submission of specimen other than nasopharyngeal swab, presence of viral mutation(s) within the areas targeted by this assay, and  inadequate number of viral copies(<138 copies/mL). A negative result must be combined with clinical observations, patient history, and epidemiological information. The expected result is Negative.  Fact Sheet for Patients:  EntrepreneurPulse.com.au  Fact Sheet for Healthcare Providers:  IncredibleEmployment.be  This test is no t yet approved or cleared by the [place] FDA and  has been authorized for detection and/or diagnosis of SARS-CoV-2 by FDA under an Emergency Use Authorization (EUA). This EUA will remain  in effect (meaning this test can be used) for the duration of the COVID-19 declaration under Section 564(b)(1) of the Act, 21 U.S.C.section 360bbb-3(b)(1), unless the authorization is terminated  or revoked sooner.       Influenza A by PCR NEGATIVE NEGATIVE Final   Influenza B by PCR NEGATIVE NEGATIVE Final    Comment: (NOTE) The Xpert Xpress SARS-CoV-2/FLU/RSV plus assay is intended as an aid in the diagnosis of influenza from Nasopharyngeal swab specimens and should not be used as a sole basis for treatment. Nasal washings and aspirates are unacceptable for Xpert Xpress SARS-CoV-2/FLU/RSV testing.  Fact Sheet for Patients: EntrepreneurPulse.com.au  Fact Sheet for Healthcare Providers: IncredibleEmployment.be  This test is not yet approved or cleared by the [place] FDA and has been authorized for detection and/or diagnosis of SARS-CoV-2 by FDA under an Emergency Use Authorization (EUA). This EUA will remain in effect (meaning this test can be used) for the duration of the COVID-19 declaration under Section 564(b)(1) of the Act, 21 U.S.C. section 360bbb-3(b)(1), unless the authorization is terminated or revoked.  Performed at [facility], [place] [address] [place], [place] [address]      Medications:    mesalamine  2.4 g Oral BID WC   pantoprazole  20 mg Oral  QAC breakfast   sodium chloride flush  3 mL Intravenous Q12H   Continuous Infusions:  heparin        LOS: 0 days   Ashley Carrillo  [facility]  08/25/2021, 7:59 AM

## 2021-08-25 NOTE — Hospital Course (Addendum)
Ashley Carrillo is a 81 y.o. female with medical history significant for HLD, GERD, history of LLE DVT, left-sided ulcerative colitis, history of GI bleeding, trigeminal neuralgia who is admitted with extensive left lower extremity DVT involving the iliac veins.  Started on IV heparin.  EDP discussed case with IR, considering vascular intervention.

## 2021-08-25 NOTE — H&P (Addendum)
Chief Complaint: Patient was seen in consultation today for  LLE venogram, mechanical thrombectomy and possible stent placement for extensive LLE DVT  at the request of Dr. Dorie Rank  Referring Physician(s): Dr. Dorie Rank  Supervising Physician: Michaelle Birks  Patient Status: [facility] - In-pt  History of Present Illness: Ashley Carrillo is a 81 y.o. female w/ PMH of GERD, HLD, trigeminal neuralgia, carotid artery stenosis and DVT. Pt presented to ED c/o left foot swelling, discoloration and pain. CT venogram 08/24/21 showed acute occlusive DVT involving left common iliac, external iliac and common femoral, femoral and profundus femoral veins. Dr. Tomi Bamberger referred pt to IR for possible intervention that was approved by Dr. Maryelizabeth Kaufmann. Procedure is tentatively scheduled for 08/26/21.   Venogram 08/24/21: IMPRESSION: 1. Acute occlusive deep venous thrombosis involving the left common iliac, external iliac, common femoral, femoral, and profundus femoral veins as above. 2. Subcutaneous edema throughout the left lower extremity. 3. Small hiatal hernia. 4.  Aortic Atherosclerosis (ICD10-I70.0).  Past Medical History:  Diagnosis Date   Arthritis    Blood clot in vein    in groin   Carotid artery stenosis, asymptomatic    GERD (gastroesophageal reflux disease)    on meds   Hyperlipidemia    Trigeminal neuralgia    Ulcerative colitis     Past Surgical History:  Procedure Laterality Date   BALLOON DILATION  07/03/2012   Procedure: BALLOON DILATION;  Surgeon: Garlan Fair, MD;  Location: [facility];  Service: Endoscopy;  Laterality: N/A;   BALLOON DILATION N/A 11/12/2013   Procedure: BALLOON DILATION;  Surgeon: Garlan Fair, MD;  Location: [facility];  Service: Endoscopy;  Laterality: N/A;   BREAST BIOPSY  11/01/2011   Procedure: BREAST BIOPSY WITH NEEDLE LOCALIZATION;  Surgeon: Adin Hector, MD;  Location: [place];  Service: General;  Laterality: Right;   BREAST  LUMPECTOMY  1970   right breast- benign   COLONOSCOPY WITH PROPOFOL N/A 09/21/2015   Procedure: COLONOSCOPY WITH PROPOFOL;  Surgeon: Garlan Fair, MD;  Location: [facility];  Service: Endoscopy;  Laterality: N/A;   CYSTECTOMY     finger   ESOPHAGOGASTRODUODENOSCOPY N/A 11/12/2013   Procedure: ESOPHAGOGASTRODUODENOSCOPY (EGD);  Surgeon: Garlan Fair, MD;  Location: [facility];  Service: Endoscopy;  Laterality: N/A;   ESOPHAGOGASTRODUODENOSCOPY (EGD) WITH ESOPHAGEAL DILATION  07/03/2012   Procedure: ESOPHAGOGASTRODUODENOSCOPY (EGD) WITH ESOPHAGEAL DILATION;  Surgeon: Garlan Fair, MD;  Location: [facility];  Service: Endoscopy;  Laterality: N/A;   ESOPHAGOGASTRODUODENOSCOPY (EGD) WITH PROPOFOL N/A 09/21/2015   Procedure: ESOPHAGOGASTRODUODENOSCOPY (EGD) WITH PROPOFOL;  Surgeon: Garlan Fair, MD;  Location: [facility];  Service: Endoscopy;  Laterality: N/A;    Allergies: Amoxicillin-pot clavulanate, Codeine, Simvastatin, Warfarin, and Warfarin sodium  Medications: Prior to Admission medications   Medication Sig Start Date End Date Taking? Authorizing Provider  cholecalciferol (VITAMIN D3) 25 MCG (1000 UNIT) tablet Take 1,000 Units by mouth daily.   Yes [provider]  cyanocobalamin 1000 MCG tablet Take 1,000 mcg by mouth daily.   Yes [provider]  hydrocortisone 2.5 % ointment Apply 1 application topically daily as needed (to affected area).   Yes [provider]  mesalamine (LIALDA) 1.2 g EC tablet Take 4 tablets (4.8 g total) by mouth daily with breakfast. You are overdue for an appointment. Please call to schedule asap. Appointment needed for further refills. Patient taking differently: Take 2.4 g by mouth 2 (two) times daily with a meal. 07/28/21  Yes Armbruster,  Carlota Raspberry, MD  ondansetron (ZOFRAN-ODT) 4 MG disintegrating tablet 4mg  ODT q4 hours prn nausea/vomit Patient taking differently: Take 4 mg by mouth every 4 (four) hours as needed  for nausea or vomiting (dissolve orally). 08/15/21  Yes Drenda Freeze, MD  pantoprazole (PROTONIX) 20 MG tablet Take 1 tablet (20 mg total) by mouth daily. Patient taking differently: Take 20 mg by mouth daily before breakfast. 02/10/21  Yes Armbruster, Carlota Raspberry, MD     Family History  Problem Relation Age of Onset   Heart disease Mother    Prostate cancer Brother    Diabetes Brother    Other Brother        unknown cause age 63   Colon cancer Neg Hx    Stomach cancer Neg Hx    Colon polyps Neg Hx    Esophageal cancer Neg Hx    Rectal cancer Neg Hx     Social History   Socioeconomic History   Marital status: Married    Spouse name: Not on file   Number of children: 1   Years of education: Not on file   Highest education level: Not on file  Occupational History   Occupation: retired  Tobacco Use   Smoking status: Former    Packs/day: 1.00    Years: 10.00    Pack years: 10.00    Types: Cigarettes    Quit date: 07/18/1968    Years since quitting: 53.1   Smokeless tobacco: Never  Vaping Use   Vaping Use: Never used  Substance and Sexual Activity   Alcohol use: No   Drug use: No   Sexual activity: Never    Partners: Male  Other Topics Concern   Not on file  Social History Narrative   Not on file   Social Determinants of Health   Financial Resource Strain: Not on file  Food Insecurity: Not on file  Transportation Needs: Not on file  Physical Activity: Not on file  Stress: Not on file  Social Connections: Not on file    Review of Systems: A 12 point ROS discussed and pertinent positives are indicated in the HPI above.  All other systems are negative.  Review of Systems  Constitutional:  Positive for fatigue. Negative for chills and fever.  HENT:  Negative for nosebleeds.   Respiratory:  Negative for cough and shortness of breath.   Cardiovascular:  Positive for leg swelling. Negative for chest pain.  Gastrointestinal:  Positive for nausea. Negative for  abdominal pain and vomiting.  Genitourinary:  Negative for hematuria.  Neurological:  Positive for dizziness and headaches.   Vital Signs: BP (!) 104/56    Pulse 76    Temp 98.7 F (37.1 C) (Oral)    Resp 18    Ht 5\' 6"  (1.676 m)    Wt 130 lb (59 kg)    SpO2 94%    BMI 20.98 kg/m   Physical Exam Constitutional:      Appearance: Normal appearance. She is not ill-appearing.  HENT:     Head: Normocephalic and atraumatic.     Mouth/Throat:     Mouth: Mucous membranes are moist.     Pharynx: Oropharynx is clear.  Eyes:     Extraocular Movements: Extraocular movements intact.  Cardiovascular:     Rate and Rhythm: Normal rate and regular rhythm.     Pulses: Normal pulses.     Heart sounds: Normal heart sounds. No murmur heard.   No friction rub. No gallop.  Pulmonary:     Effort: Pulmonary effort is normal. No respiratory distress.     Breath sounds: Normal breath sounds. No stridor. No wheezing, rhonchi or rales.  Abdominal:     General: Bowel sounds are normal. There is no distension.     Palpations: Abdomen is soft.     Tenderness: There is no abdominal tenderness. There is no guarding.  Musculoskeletal:        General: Swelling present. Normal range of motion.     Right lower leg: No edema.     Left lower leg: Edema present.     Comments: Palpable Left dorsal pedal pulse   Skin:    General: Skin is warm and dry.  Neurological:     Mental Status: She is alert and oriented to person, place, and time.  Psychiatric:        Mood and Affect: Mood normal.        Behavior: Behavior normal.        Thought Content: Thought content normal.        Judgment: Judgment normal.    Imaging: VAS [place] IVC/ILIAC (VENOUS ONLY)  Result Date: 08/24/2021 IVC/ILIAC STUDY Patient Name:  Ashley Carrillo  Date of Exam:   08/24/2021 Medical Rec #: [id number]        Accession #:    [id number] Date of Birth: December 26, 1940        Patient Gender: F Patient Age:   5 years Exam Location:  [facility]  Procedure:      VAS [place] IVC/ILIAC (VENOUS ONLY) Referring Phys: Wille Glaser KNAPP --------------------------------------------------------------------------------  Indications: Extensive acute DVT of the left lower extremity with extension              above the inguinal ligament Limitations: Air/bowel gas.  Comparison Study: 08-24-2021 Left lower extremity venous duplex study Performing Technologist: Darlin Coco RDMS, RVT  Examination Guidelines: A complete evaluation includes B-mode imaging, spectral Doppler, color Doppler, and power Doppler as needed of all accessible portions of each vessel. Bilateral testing is considered an integral part of a complete examination. Limited examinations for reoccurring indications may be performed as noted.  IVC/Iliac Findings: +----------+------+--------+--------+     IVC     Patent Thrombus Comments  +----------+------+--------+--------+  IVC Prox   patent                    +----------+------+--------+--------+  IVC Mid    patent                    +----------+------+--------+--------+  IVC Distal patent                    +----------+------+--------+--------+  +-------------------+---------+-----------+---------+-----------+--------+          CIV         RT-Patent RT-Thrombus LT-Patent LT-Thrombus Comments  +-------------------+---------+-----------+---------+-----------+--------+  Common Iliac Prox                                      acute              +-------------------+---------+-----------+---------+-----------+--------+  Common Iliac Mid                                       acute              +-------------------+---------+-----------+---------+-----------+--------+  Common Iliac Distal                                    acute              +-------------------+---------+-----------+---------+-----------+--------+  +-------------------------+---------+-----------+---------+-----------+--------+             EIV            RT-Patent RT-Thrombus LT-Patent LT-Thrombus Comments   +-------------------------+---------+-----------+---------+-----------+--------+  External Iliac Vein Prox                                     acute              +-------------------------+---------+-----------+---------+-----------+--------+  External Iliac Vein Mid                                      acute              +-------------------------+---------+-----------+---------+-----------+--------+  External Iliac Vein                                          acute               Distal                                                                          +-------------------------+---------+-----------+---------+-----------+--------+   Summary: IVC/Iliac: There is no evidence of thrombus involving the IVC. There is evidence of acute thrombus involving the left common iliac vein. There is evidence of acute thrombus involving the left external iliac vein. Visualized segments of the right iliac veins appear patent.  *See table(s) above for measurements and observations.   Preliminary    DG Chest Port 1 View  Result Date: 08/15/2021 CLINICAL DATA:  Short of breath. Diagnosed with COVID-19 1 week ago. EXAM: PORTABLE CHEST 1 VIEW COMPARISON:  08/07/2021. FINDINGS: Cardiac silhouette is normal in size and configuration. Normal mediastinal and hilar contours. Clear lungs.  No pleural effusion or pneumothorax. Skeletal structures are grossly intact IMPRESSION: No active disease. Electronically Signed   By: Lajean Manes M.D.   On: 08/15/2021 16:39   DG Chest Portable 1 View  Result Date: 08/07/2021 CLINICAL DATA:  Cough. EXAM: PORTABLE CHEST 1 VIEW COMPARISON:  None. FINDINGS: 1627 hours. The lungs are clear without focal pneumonia, edema, pneumothorax or pleural effusion. The cardiopericardial silhouette is within normal limits for size. The visualized bony structures of the thorax show no acute abnormality. IMPRESSION: No active disease. Electronically Signed   By: Misty Stanley M.D.   On: 08/07/2021 16:41    CT VENOGRAM ABD/PEL  Result Date: 08/24/2021 CLINICAL DATA:  History of blood clots, left foot discoloration, deep venous thrombosis EXAM: CT VENOGRAM ABDOMEN AND PELVIS TECHNIQUE: Multidetector CT imaging of the abdomen was performed using the standard protocol following bolus administration of intravenous contrast, with imaging performed during the portal venous  and delayed venous phases of contrast enhancement. RADIATION DOSE REDUCTION: This exam was performed according to the departmental dose-optimization program which includes automated exposure control, adjustment of the mA and/or kV according to patient size and/or use of iterative reconstruction technique. CONTRAST:  15mL OMNIPAQUE IOHEXOL 350 MG/ML SOLN COMPARISON:  None. FINDINGS: Lower chest: No acute pleural or parenchymal lung disease. Small hiatal hernia. Hepatobiliary: No focal liver abnormality is seen. No gallstones, gallbladder wall thickening, or biliary dilatation. Pancreas: Unremarkable. No pancreatic ductal dilatation or surrounding inflammatory changes. Spleen: Normal in size without focal abnormality. Adrenals/Urinary Tract: There are bilateral peripelvic renal cysts. Kidneys enhance normally and symmetrically. No urinary tract calculi or obstruction. Bladder is unremarkable. Adrenals are normal. Stomach/Bowel: No bowel obstruction or ileus. No bowel wall thickening or inflammatory change. Vascular/Lymphatic: There is enlargement and occlusive thrombus involving the left left common iliac, external iliac, common femoral, and visualized portions of the superficial and profundus femoral veins, consistent with acute DVT. Diffuse atherosclerosis of the aorta. No pathologic adenopathy of the abdomen or pelvis. Reproductive: Uterus and bilateral adnexa are unremarkable. Other: No free fluid or free gas.  No abdominal wall hernia. Musculoskeletal: There is diffuse subcutaneous edema within the visualized left lower extremity consistent with  acute DVT. No acute or destructive bony lesions. 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 Reconstructed images demonstrate no additional findings. IMPRESSION: 1. Acute occlusive deep venous thrombosis involving the left common iliac, external iliac, common femoral, femoral, and profundus femoral veins as above. 2. Subcutaneous edema throughout the left lower extremity. 3. Small hiatal hernia. 4.  Aortic Atherosclerosis (ICD10-I70.0). Critical Value/emergent results were called by telephone at the time of interpretation on 08/24/2021 at 10:38 pm to provider [facility] , who verbally acknowledged these results. Electronically Signed   By: Randa Ngo M.D.   On: 08/24/2021 22:39   VAS [place] LOWER EXTREMITY VENOUS (DVT) (7a-7p)  Result Date: 08/24/2021  Lower Venous DVT Study Patient Name:  Ashley Carrillo  Date of Exam:   08/24/2021 Medical Rec #: [id number]        Accession #:    [id number] Date of Birth: Jun 28, 1941        Patient Gender: F Patient Age:   89 years Exam Location:  [facility] Procedure:      VAS [place] LOWER EXTREMITY VENOUS (DVT) Referring Phys: Charmaine Downs --------------------------------------------------------------------------------  Indications: History of DVT of the left lower extremity, recent Covid-19 (08/10/21), pain, swelling, and discoloration to left lower extremity.  Comparison Study: No prior studies available. Performing Technologist: Darlin Coco RDMS, RVT  Examination Guidelines: A complete evaluation includes B-mode imaging, spectral Doppler, color Doppler, and power Doppler as needed of all accessible portions of each vessel. Bilateral testing is considered an integral part of a complete examination. Limited examinations for reoccurring indications may be performed as noted. The reflux portion of the exam is performed with the patient in reverse Trendelenburg.  +-----+---------------+---------+-----------+----------+--------------+   RIGHT Compressibility Phasicity Spontaneity Properties Thrombus Aging  +-----+---------------+---------+-----------+----------+--------------+  CFV   Full            Yes       Yes                                    +-----+---------------+---------+-----------+----------+--------------+   +---------+---------------+---------+-----------+----------+--------------+  LEFT      Compressibility Phasicity Spontaneity Properties Thrombus Aging  +---------+---------------+---------+-----------+----------+--------------+  CFV  None            No        No                     Acute           +---------+---------------+---------+-----------+----------+--------------+  SFJ       None            No        No                     Acute           +---------+---------------+---------+-----------+----------+--------------+  FV Prox   None            No        No                     Acute           +---------+---------------+---------+-----------+----------+--------------+  FV Mid    None            No        No                     Acute           +---------+---------------+---------+-----------+----------+--------------+  FV Distal None            No        No                     Acute           +---------+---------------+---------+-----------+----------+--------------+  PFV       None            No        No                     Acute           +---------+---------------+---------+-----------+----------+--------------+  POP       None            No        No                     Acute           +---------+---------------+---------+-----------+----------+--------------+  PTV       Partial         Yes       Yes                    Acute           +---------+---------------+---------+-----------+----------+--------------+  PERO      Partial         Yes       Yes                    Acute           +---------+---------------+---------+-----------+----------+--------------+  Gastroc   None            No        No                      Acute           +---------+---------------+---------+-----------+----------+--------------+  EIV       None            No  No                     Acute           +---------+---------------+---------+-----------+----------+--------------+    Summary: RIGHT: - No evidence of common femoral vein obstruction.  LEFT: - Findings consistent with acute deep vein thrombosis involving the left common femoral vein, SF junction, left femoral vein, left proximal profunda vein, left popliteal vein, left posterior tibial veins, left peroneal veins, and left gastrocnemius veins.  - Possible obstruction proximal to the inguinal ligament.  - A cystic structure is found in the popliteal fossa.  *See table(s) above for measurements and observations.    Preliminary     Labs:  CBC: Recent Labs    08/15/21 1552 08/24/21 1726 08/25/21 0634  WBC 13.0* 15.5* 11.5*  HGB 14.4 12.0 11.4*  HCT 44.8 36.4 34.4*  PLT 191 154 145*    COAGS: No results for input(s): INR, APTT in the last 8760 hours.  BMP: Recent Labs    08/15/21 1552 08/24/21 1726 08/25/21 0634  NA 138 131* 132*  K 3.3* 4.1 4.2  CL 100 96* 98  CO2 28 26 26   GLUCOSE 116* 125* 91  BUN 22 20 17   CALCIUM 8.4* 8.5* 8.6*  CREATININE 0.82 0.89 0.75  GFRNONAA >60 >60 >60    LIVER FUNCTION TESTS: Recent Labs    08/15/21 1552  BILITOT 0.4  AST 18  ALT 9  ALKPHOS 46  PROT 6.0*  ALBUMIN 3.3*    TUMOR MARKERS: No results for input(s): AFPTM, CEA, CA199, CHROMGRNA in the last 8760 hours.  Assessment and Plan: History of GERD, HLD, trigeminal neuralgia, carotid artery stenosis and DVT. Pt presented to ED c/o left foot swelling, discoloration and pain. CT venogram 08/24/21 showed acute occlusive DVT involving left common iliac, external iliac and common femoral, femoral and profundus femoral veins. Dr. Tomi Bamberger referred pt to IR for possible intervention that was approved by Dr. Maryelizabeth Kaufmann. Procedure is tentatively scheduled for 08/26/21.   Venogram  08/24/21: IMPRESSION: 1. Acute occlusive deep venous thrombosis involving the left common iliac, external iliac, common femoral, femoral, and profundus femoral veins as above. 2. Subcutaneous edema throughout the left lower extremity. 3. Small hiatal hernia. 4.  Aortic Atherosclerosis (ICD10-I70.0).  Pt resting on stretcher with husband at bedside.  She is A&O, calm and pleasant. She is in no distress.  Pt understands that she needs to be NPO at [facility] prior to procedure.  Procedure tentatively scheduled for 08/26/21.   Heparin gtt does not need to be held for procedure per Dr. Maryelizabeth Kaufmann.   Risks and benefits discussed with the patient including, but not limited to bleeding, possible life threatening bleeding and need for blood product transfusion, vascular injury, stroke, contrast induced renal failure, limb loss and infection.  All of the patient's questions were answered, patient is agreeable to proceed. Consent signed and placed in IR.   Thank you for this interesting consult.  I greatly enjoyed meeting Ashley Carrillo and look forward to participating in their care.  A copy of this report was sent to the requesting provider on this date.  Electronically Signed: Tyson Alias, NP 08/25/2021, 11:59 AM   I spent a total of 20 minutes in face to face in clinical consultation, greater than 50% of which was counseling/coordinating care for  Patient was seen in consultation today for LLE venogram, mechanical thrombectomy and possible stent placement for extensive LLE DVT.

## 2021-08-25 NOTE — Progress Notes (Signed)
Vascular and Interventional Radiology  On Call Phone / Brief  Consult Note  Patient: Ashley Carrillo DOB: [id number] Medical Record Number: [id number] Note Date/Time: 08/25/21 7:50 AM   Admitting Diagnosis:   Acute deep vein thrombosis (DVT) of iliac vein of left lower extremity ([place]) [I82.422]  1. Acute deep vein thrombosis (DVT) of proximal vein of left lower extremity ([place])   2. Ulcerative colitis without complications, unspecified location [facility])      Imaging review:   BLE Venous Duplex concerning for LLE DVT.   CT AP (08/24/21) Independently reviewed demonstrating extensive LLE DVT, extending to the L CIV. Appears to involve compression of the crossing R CIA with "May Thurner" physiology     Assessment   Plan: 81 y.o. year old female about whom [facility] MD reached out to [place] On Call Physician with concern for LLE DVT  - Requested CTV AP to evaluate for proximal extension.  - Imaging appears to demonstrate MT lesion - VIR to evaluate today (08/25/21) and assess for possible intervention tomorrow (08/26/21). - Full Consult Note to follow.   Thank you for allowing [place] to participate in the care of your Patient. Please contact the [place] Team with questions, concerns, or if new issues arise.   Michaelle Birks, MD [facility]   Pager. [place]

## 2021-08-25 NOTE — Progress Notes (Signed)
[place] for heparin Indication: acute DVT  Allergies  Allergen Reactions   Amoxicillin-Pot Clavulanate Diarrhea   Codeine Nausea And Vomiting and Other (See Comments)    "drunk feeling and it puts me on the floor"    Simvastatin Other (See Comments)    "Made my legs hurt terribly bad"   Warfarin Other (See Comments)    History of "ulcerative colitis"   Warfarin Sodium Other (See Comments)    History of "ulcerative colitis" and "Blood thinning, so contraindicated"    Patient Measurements: Height: 5\' 6"  (167.6 cm) Weight: 59 kg (130 lb) IBW/kg (Calculated) : 59.3 Heparin Dosing Weight: n/a. Use TBW of 59 kg  Vital Signs: Temp: 98.7 F (37.1 C) (02/08 0216) Temp Source: Oral (02/08 0216) BP: 109/59 (02/08 0700) Pulse Rate: 80 (02/08 0700)  Labs: Recent Labs    08/24/21 1726 08/25/21 0634  HGB 12.0 11.4*  HCT 36.4 34.4*  PLT 154 145*  HEPARINUNFRC  --  0.74*  CREATININE 0.89 0.75     Estimated Creatinine Clearance: 51.4 mL/min (by C-G formula based on SCr of 0.75 mg/dL).  Assessment: Patient is an 81 y.o F who was COVID+ on 08/10/21, presented to the ED on 08/24/21 with c/o discoloration of left foot.  LE doppler on 08/24/21 showed extensive acute LLE DVT.  IVC/iliac doppler also showed acute thrombus involving the left common and external iliac veins.  Pharmacy has been consulted to start heparin drip for VTE treatment.  PMH significant for UC and a history of LLE DVT for which patient was previously prescribed apixaban that was subsequently stopped s/p GIB. Not currently on anticoagulants PTA.   Today, 08/25/21 HL = 0.74 is slightly SUPRAtherapeutic on heparin infusion of 950 units/hr CBC: Hgb and Plt slightly decreased Confirmed with RN that heparin infusing at correct rate. No signs of bleeding. Renal function stable - WNL  Goal of Therapy:  Heparin level 0.3-0.7 units/ml Monitor platelets by anticoagulation protocol:  Yes   Plan:  Decrease heparin infusion to 850 units/hr Check 8 hour HL CBC, HL daily while on heparin infusion Monitor for signs of bleeding  Lenis Noon, PharmD 08/25/2021,7:47 AM

## 2021-08-25 NOTE — Assessment & Plan Note (Signed)
Extensive acute LLE DVT involving the left common and external iliac veins, left common femoral, SF junction, left femoral, left proximal profunda, left popliteal, left posterior tibial, left peroneal, and left gastrocnemius veins. -Started on IV heparin -EDP discussed with IR who recommended CT venogram, may be candidate for vascular intervention

## 2021-08-25 NOTE — Assessment & Plan Note (Signed)
Continue Protonix °

## 2021-08-25 NOTE — Progress Notes (Signed)
[place] for heparin Indication: acute DVT  Allergies  Allergen Reactions   Amoxicillin-Pot Clavulanate Diarrhea   Codeine Nausea And Vomiting and Other (See Comments)    "drunk feeling and it puts me on the floor"    Simvastatin Other (See Comments)    "Made my legs hurt terribly bad"   Warfarin Other (See Comments)    History of "ulcerative colitis"   Warfarin Sodium Other (See Comments)    History of "ulcerative colitis" and "Blood thinning, so contraindicated"    Patient Measurements: Height: 5\' 6"  (167.6 cm) Weight: 59 kg (130 lb) IBW/kg (Calculated) : 59.3 Heparin Dosing Weight: n/a. Use TBW of 59 kg  Vital Signs: Temp: 98.9 F (37.2 C) (02/08 1803) Temp Source: Oral (02/08 1803) BP: 107/52 (02/08 1803) Pulse Rate: 83 (02/08 1803)  Labs: Recent Labs    08/24/21 1726 08/25/21 0634 08/25/21 1731  HGB 12.0 11.4*  --   HCT 36.4 34.4*  --   PLT 154 145*  --   HEPARINUNFRC  --  0.74* 0.22*  CREATININE 0.89 0.75  --      Estimated Creatinine Clearance: 51.4 mL/min (by C-G formula based on SCr of 0.75 mg/dL).  Assessment: Patient is an 81 y.o F who was COVID+ on 08/10/21, presented to the ED on 08/24/21 with c/o discoloration of left foot.  LE doppler on 08/24/21 showed extensive acute LLE DVT.  IVC/iliac doppler also showed acute thrombus involving the left common and external iliac veins.  Pharmacy has been consulted to start heparin drip for VTE treatment.  PMH significant for UC and a history of LLE DVT for which patient was previously prescribed apixaban that was subsequently stopped s/p GIB. Not currently on anticoagulants PTA.   Today, 08/25/21 Heparin level supratherapeutic >> subtherapeutic after 11% rate decrease to 850 units/hr CBC: Hgb and Plt slightly decreased Confirmed with RN that heparin infusing at correct rate. No signs of bleeding. Renal function stable - WNL  Goal of Therapy:  Heparin level 0.3-0.7  units/ml Monitor platelets by anticoagulation protocol: Yes   Plan:  Increase heparin back to 900 units/hr Check 8 hour HL CBC, HL daily while on heparin infusion Monitor for signs of bleeding  Jemar Paulsen A, PharmD 08/25/2021,7:31 PM

## 2021-08-26 ENCOUNTER — Inpatient Hospital Stay (HOSPITAL_COMMUNITY): Payer: Medicare HMO

## 2021-08-26 LAB — CBC
HCT: 32.1 % — ABNORMAL LOW (ref 36.0–46.0)
Hemoglobin: 10.6 g/dL — ABNORMAL LOW (ref 12.0–15.0)
MCH: 30 pg (ref 26.0–34.0)
MCHC: 33 g/dL (ref 30.0–36.0)
MCV: 90.9 fL (ref 80.0–100.0)
Platelets: 167 10*3/uL (ref 150–400)
RBC: 3.53 MIL/uL — ABNORMAL LOW (ref 3.87–5.11)
RDW: 14.2 % (ref 11.5–15.5)
WBC: 9.4 10*3/uL (ref 4.0–10.5)
nRBC: 0 % (ref 0.0–0.2)

## 2021-08-26 LAB — HEPARIN LEVEL (UNFRACTIONATED)
Heparin Unfractionated: 0.31 IU/mL (ref 0.30–0.70)
Heparin Unfractionated: 0.34 IU/mL (ref 0.30–0.70)

## 2021-08-26 NOTE — Progress Notes (Signed)
°  Transition of Care [facility]) Screening Note   Patient Details  Name: [name] Date of Birth: 11-Oct-1940   Transition of Care [facility]) CM/SW Contact:    Lennart Pall, LCSW Phone Number: 08/26/2021, 1:46 PM    Transition of Care Department [facility]) has reviewed patient and no TOC needs have been identified at this time. We will continue to monitor patient advancement through interdisciplinary progression rounds. If new patient transition needs arise, please place a TOC consult.

## 2021-08-26 NOTE — Progress Notes (Signed)
[facility] PROGRESS NOTE    Progress Note  Ashley Carrillo  SMO:[id number] DOB: 08-26-1940 DOA: 08/24/2021 PCP: [name], MD     Brief Narrative:   Ashley Carrillo is an 81 y.o. female past medical history significant for hyperlipidemia, history of left lower extremity DVT, not on anticoagulation left-sided ulcerative colitis, history of GI bleed trigeminal neuralgia presents for evaluation of left lower extremity swelling and discoloration, she relates her last flare of ulcerative colitis was several years ago, she has been asymptomatic for some time now.   Assessment/Plan:   Acute deep vein thrombosis (DVT) of iliac vein of left lower extremity [facility]): Lower extremity Doppler showed extensive acute lower extremity DVT in the left common and external iliac vein on the left. Cont. IV heparin. IR was consulted mechanical thrombectomy and possible stent placement scheduled for intervention on 08/27/2021 Ulcerative colitis flares puts her at risk of developing DVTs  Left sided ulcerative colitis: Currently stable on Lialda is at high risk of GI bleed being on anticoagulation. Currently no symptoms no diarrhea or flares of ulcerative colitis  GERD: Continue Protonix.    DVT prophylaxis: heparin Family Communication:none Status is: Inpatient The patient will require care spanning > 2 midnights and should be moved to inpatient because: Acute DVT with previous history of GI bleed due to ulcerative colitis started on IV heparin IR consulted for thrombectomy     Code Status:     Code Status Orders  (From admission, onward)           Start     Ordered   08/24/21 2320  Full code  Continuous        08/24/21 2320           Code Status History     This patient has a current code status but no historical code status.         IV Access:   Peripheral IV   Procedures and diagnostic studies:   VAS [place] IVC/ILIAC (VENOUS ONLY)  Result Date:  08/25/2021 IVC/ILIAC STUDY Patient Name:  Ashley Carrillo  Date of Exam:   08/24/2021 Medical Rec #: [id number]        Accession #:    [id number] Date of Birth: May 10, 1941        Patient Gender: F Patient Age:   46 years Exam Location:  [facility] Procedure:      VAS [place] IVC/ILIAC (VENOUS ONLY) Referring Phys: Wille Glaser KNAPP --------------------------------------------------------------------------------  Indications: Extensive acute DVT of the left lower extremity with extension              above the inguinal ligament Limitations: Air/bowel gas.  Comparison Study: 08-24-2021 Left lower extremity venous duplex study Performing Technologist: Darlin Coco RDMS, RVT  Examination Guidelines: A complete evaluation includes B-mode imaging, spectral Doppler, color Doppler, and power Doppler as needed of all accessible portions of each vessel. Bilateral testing is considered an integral part of a complete examination. Limited examinations for reoccurring indications may be performed as noted.  IVC/Iliac Findings: +----------+------+--------+--------+     IVC     Patent Thrombus Comments  +----------+------+--------+--------+  IVC Prox   patent                    +----------+------+--------+--------+  IVC Mid    patent                    +----------+------+--------+--------+  IVC Distal patent                    +----------+------+--------+--------+  +-------------------+---------+-----------+---------+-----------+--------+  CIV         RT-Patent RT-Thrombus LT-Patent LT-Thrombus Comments  +-------------------+---------+-----------+---------+-----------+--------+  Common Iliac Prox                                      acute              +-------------------+---------+-----------+---------+-----------+--------+  Common Iliac Mid                                       acute              +-------------------+---------+-----------+---------+-----------+--------+  Common Iliac Distal                                     acute              +-------------------+---------+-----------+---------+-----------+--------+  +-------------------------+---------+-----------+---------+-----------+--------+             EIV            RT-Patent RT-Thrombus LT-Patent LT-Thrombus Comments  +-------------------------+---------+-----------+---------+-----------+--------+  External Iliac Vein Prox                                     acute              +-------------------------+---------+-----------+---------+-----------+--------+  External Iliac Vein Mid                                      acute              +-------------------------+---------+-----------+---------+-----------+--------+  External Iliac Vein                                          acute               Distal                                                                          +-------------------------+---------+-----------+---------+-----------+--------+   Summary: IVC/Iliac: There is no evidence of thrombus involving the IVC. There is evidence of acute thrombus involving the left common iliac vein. There is evidence of acute thrombus involving the left external iliac vein. Visualized segments of the right iliac veins appear patent.  *See table(s) above for measurements and observations.  Electronically signed by Harold Barban MD on 08/25/2021 at 8:53:21 PM.    Final    CT VENOGRAM ABD/PEL  Result Date: 08/24/2021 CLINICAL DATA:  History of blood clots, left foot discoloration, deep venous thrombosis EXAM: CT VENOGRAM ABDOMEN AND PELVIS TECHNIQUE: Multidetector CT imaging of the abdomen was performed using the standard protocol following bolus administration of intravenous contrast, with imaging performed during the portal venous and delayed venous phases of contrast enhancement. RADIATION DOSE REDUCTION:  This exam was performed according to the departmental dose-optimization program which includes automated exposure control, adjustment of the mA and/or kV according to patient  size and/or use of iterative reconstruction technique. CONTRAST:  131m OMNIPAQUE IOHEXOL 350 MG/ML SOLN COMPARISON:  None. FINDINGS: Lower chest: No acute pleural or parenchymal lung disease. Small hiatal hernia. Hepatobiliary: No focal liver abnormality is seen. No gallstones, gallbladder wall thickening, or biliary dilatation. Pancreas: Unremarkable. No pancreatic ductal dilatation or surrounding inflammatory changes. Spleen: Normal in size without focal abnormality. Adrenals/Urinary Tract: There are bilateral peripelvic renal cysts. Kidneys enhance normally and symmetrically. No urinary tract calculi or obstruction. Bladder is unremarkable. Adrenals are normal. Stomach/Bowel: No bowel obstruction or ileus. No bowel wall thickening or inflammatory change. Vascular/Lymphatic: There is enlargement and occlusive thrombus involving the left left common iliac, external iliac, common femoral, and visualized portions of the superficial and profundus femoral veins, consistent with acute DVT. Diffuse atherosclerosis of the aorta. No pathologic adenopathy of the abdomen or pelvis. Reproductive: Uterus and bilateral adnexa are unremarkable. Other: No free fluid or free gas.  No abdominal wall hernia. Musculoskeletal: There is diffuse subcutaneous edema within the visualized left lower extremity consistent with acute DVT. No acute or destructive bony lesions. 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 Reconstructed images demonstrate no additional findings. IMPRESSION: 1. Acute occlusive deep venous thrombosis involving the left common iliac, external iliac, common femoral, femoral, and profundus femoral veins as above. 2. Subcutaneous edema throughout the left lower extremity. 3. Small hiatal hernia. 4.  Aortic Atherosclerosis (ICD10-I70.0). Critical Value/emergent results were called by telephone at the time of interpretation on 08/24/2021 at 10:38 pm to [facility], who verbally  acknowledged these results. Electronically Signed   By: MRanda NgoM.D.   On: 08/24/2021 22:39   VAS [place]OWER EXTREMITY VENOUS (DVT) (7a-7p)  Result Date: 08/25/2021  Lower Venous DVT Study Patient Name:  Ashley Carrillo Date of Exam:   08/24/2021 Medical Rec #: [id number]       Accession #:    [id number] of Birth: 8Mar 26, 1942       Patient Gender: F Patient Age:   89years [facility]      [facility] [place] EXTREMITY VENOUS (DVT) Referring Phys: CCharmaine Downs--------------------------------------------------------------------------------  Indications: History of DVT of the left lower extremity, recent Covid-19 (08/10/21), pain, swelling, and discoloration to left lower extremity.  Comparison Study: No prior studies available. Performing Technologist: RDarlin CocoRDMS, RVT  Examination Guidelines: A complete evaluation includes B-mode imaging, spectral Doppler, color Doppler, and power Doppler as needed of all accessible portions of each vessel. Bilateral testing is considered an integral part of a complete examination. Limited examinations for reoccurring indications may be performed as noted. The reflux portion of the exam is performed with the patient in reverse Trendelenburg.  +-----+---------------+---------+-----------+----------+--------------+  RIGHT Compressibility Phasicity Spontaneity Properties Thrombus Aging  +-----+---------------+---------+-----------+----------+--------------+  CFV   Full            Yes       Yes                                    +-----+---------------+---------+-----------+----------+--------------+   +---------+---------------+---------+-----------+----------+--------------+  LEFT      Compressibility Phasicity Spontaneity Properties Thrombus Aging  +---------+---------------+---------+-----------+----------+--------------+  CFV       None  No        No                     Acute            +---------+---------------+---------+-----------+----------+--------------+  SFJ       None            No        No                     Acute           +---------+---------------+---------+-----------+----------+--------------+  FV Prox   None            No        No                     Acute           +---------+---------------+---------+-----------+----------+--------------+  FV Mid    None            No        No                     Acute           +---------+---------------+---------+-----------+----------+--------------+  FV Distal None            No        No                     Acute           +---------+---------------+---------+-----------+----------+--------------+  PFV       None            No        No                     Acute           +---------+---------------+---------+-----------+----------+--------------+  POP       None            No        No                     Acute           +---------+---------------+---------+-----------+----------+--------------+  PTV       Partial         Yes       Yes                    Acute           +---------+---------------+---------+-----------+----------+--------------+  PERO      Partial         Yes       Yes                    Acute           +---------+---------------+---------+-----------+----------+--------------+  Gastroc   None            No        No                     Acute           +---------+---------------+---------+-----------+----------+--------------+  EIV       None            No        No  Acute           +---------+---------------+---------+-----------+----------+--------------+     Summary: RIGHT: - No evidence of common femoral vein obstruction.  LEFT: - Findings consistent with acute deep vein thrombosis involving the left common femoral vein, SF junction, left femoral vein, left proximal profunda vein, left popliteal vein, left posterior tibial veins, left peroneal veins, and left gastrocnemius veins.  - Possible  obstruction proximal to the inguinal ligament.  - A cystic structure is found in the popliteal fossa.  *See table(s) above for measurements and observations. Electronically signed by Harold Barban MD on 08/25/2021 at 8:53:36 PM.    Final      Medical Consultants:   None.   Subjective:    Ashley Carrillo she relates her pain is stable  Objective:    Vitals:   08/25/21 1803 08/25/21 2208 08/26/21 0127 08/26/21 0513  BP: (!) 107/52 (!) 108/43 (!) 109/52 (!) 108/46  Pulse: 83 78 74 79  Resp: 16 15 15 15   Temp: 98.9 F (37.2 C) 99.8 F (37.7 C) 98.3 F (36.8 C) 98.4 F (36.9 C)  TempSrc: Oral Oral    SpO2: 99% 96% 95% 96%  Weight:      Height:       SpO2: 96 %   Intake/Output Summary (Last 24 hours) at 08/26/2021 0929 Last data filed at 08/26/2021 0600 Gross per 24 hour  Intake 671.54 ml  Output 375 ml  Net 296.54 ml   Filed Weights   08/24/21 1647  Weight: 59 kg    Exam: General exam: In no acute distress. Respiratory system: Good air movement and clear to auscultation. Cardiovascular system: S1 & S2 heard, RRR. No JVD. Gastrointestinal system: Abdomen is nondistended, soft and nontender.  Extremities: No pedal edema. Skin: No rashes, lesions or ulcers Psychiatry: Judgement and insight appear normal. Mood & affect appropriate.   Data Reviewed:    Labs: Basic Metabolic Panel: Recent Labs  Lab 08/24/21 1726 08/25/21 0634  NA 131* 132*  K 4.1 4.2  CL 96* 98  CO2 26 26  GLUCOSE 125* 91  BUN 20 17  CREATININE 0.89 0.75  CALCIUM 8.5* 8.6*    GFR Estimated Creatinine Clearance: 51.4 mL/min (by C-G formula based on SCr of 0.75 mg/dL). Liver Function Tests: No results for input(s): AST, ALT, ALKPHOS, BILITOT, PROT, ALBUMIN in the last 168 hours. No results for input(s): LIPASE, AMYLASE in the last 168 hours. No results for input(s): AMMONIA in the last 168 hours. Coagulation profile No results for input(s): INR, PROTIME in the last 168 hours. COVID-19  Labs  No results for input(s): DDIMER, FERRITIN, LDH, CRP in the last 72 hours.  Lab Results  Component Value Date   SARSCOV2NAA NEGATIVE 08/24/2021   [place] NEGATIVE 08/07/2021    CBC: Recent Labs  Lab 08/24/21 1726 08/25/21 0634 08/26/21 0315  WBC 15.5* 11.5* 9.4  NEUTROABS 12.3*  --   --   HGB 12.0 11.4* 10.6*  HCT 36.4 34.4* 32.1*  MCV 90.3 89.8 90.9  PLT 154 145* 167    Cardiac Enzymes: No results for input(s): CKTOTAL, CKMB, CKMBINDEX, TROPONINI in the last 168 hours. BNP (last 3 results) No results for input(s): PROBNP in the last 8760 hours. CBG: No results for input(s): GLUCAP in the last 168 hours. D-Dimer: No results for input(s): DDIMER in the last 72 hours. Hgb A1c: No results for input(s): HGBA1C in the last 72 hours. Lipid Profile: No results for input(s): CHOL, HDL, LDLCALC, TRIG, CHOLHDL,  LDLDIRECT in the last 72 hours. Thyroid function studies: No results for input(s): TSH, T4TOTAL, T3FREE, THYROIDAB in the last 72 hours.  Invalid input(s): FREET3 Anemia work up: No results for input(s): VITAMINB12, FOLATE, FERRITIN, TIBC, IRON, RETICCTPCT in the last 72 hours. Sepsis Labs: Recent Labs  Lab 08/24/21 1726 08/25/21 0634 08/26/21 0315  WBC 15.5* 11.5* 9.4    Microbiology Recent Results (from the past 240 hour(s))  Resp Panel by RT-PCR (Flu A&B, Covid) Nasopharyngeal Swab     Status: None   Collection Time: 08/24/21  8:55 PM   Specimen: Nasopharyngeal Swab; Nasopharyngeal(NP) swabs in vial transport medium  Result Value Ref Range Status   SARS Coronavirus 2 by RT PCR NEGATIVE NEGATIVE Final    Comment: (NOTE) SARS-CoV-2 target nucleic acids are NOT DETECTED.  The SARS-CoV-2 RNA is generally detectable in upper respiratory specimens during the acute phase of infection. The lowest concentration of SARS-CoV-2 viral copies this assay can detect is 138 copies/mL. A negative result does not preclude SARS-Cov-2 infection and should not be  used as the sole basis for treatment or other patient management decisions. A negative result may occur with  improper specimen collection/handling, submission of specimen other than nasopharyngeal swab, presence of viral mutation(s) within the areas targeted by this assay, and inadequate number of viral copies(<138 copies/mL). A negative result must be combined with clinical observations, patient history, and epidemiological information. The expected result is Negative.  Fact Sheet for Patients:  EntrepreneurPulse.com.au  Fact Sheet for Healthcare Providers:  IncredibleEmployment.be  This test is no t yet approved or cleared by the [place] FDA and  has been authorized for detection and/or diagnosis of SARS-CoV-2 by FDA under an Emergency Use Authorization (EUA). This EUA will remain  in effect (meaning this test can be used) for the duration of the COVID-19 declaration under Section 564(b)(1) of the Act, 21 U.S.C.section 360bbb-3(b)(1), unless the authorization is terminated  or revoked sooner.       Influenza A by PCR NEGATIVE NEGATIVE Final   Influenza B by PCR NEGATIVE NEGATIVE Final    Comment: (NOTE) The Xpert Xpress SARS-CoV-2/FLU/RSV plus assay is intended as an aid in the diagnosis of influenza from Nasopharyngeal swab specimens and should not be used as a sole basis for treatment. Nasal washings and aspirates are unacceptable for Xpert Xpress SARS-CoV-2/FLU/RSV testing.  Fact Sheet for Patients: EntrepreneurPulse.com.au  Fact Sheet for Healthcare Providers: IncredibleEmployment.be  This test is not yet approved or cleared by the [place] FDA and has been authorized for detection and/or diagnosis of SARS-CoV-2 by FDA under an Emergency Use Authorization (EUA). This EUA will remain in effect (meaning this test can be used) for the duration of the COVID-19 declaration under Section  564(b)(1) of the Act, 21 U.S.C. section 360bbb-3(b)(1), unless the authorization is terminated or revoked.  Performed at [facility], [place] [address] [place], [place] [address]      Medications:    mesalamine  2.4 g Oral BID WC   pantoprazole  20 mg Oral QAC breakfast   sodium chloride flush  3 mL Intravenous Q12H   Continuous Infusions:  heparin 900 Units/hr (08/26/21 0052)      LOS: 1 day   Charlynne Cousins  [facility]  08/26/2021, 9:29 AM

## 2021-08-26 NOTE — Progress Notes (Addendum)
[place] for heparin Indication: acute DVT  Allergies  Allergen Reactions   Amoxicillin-Pot Clavulanate Diarrhea   Codeine Nausea And Vomiting and Other (See Comments)    "drunk feeling and it puts me on the floor"    Simvastatin Other (See Comments)    "Made my legs hurt terribly bad"   Warfarin Other (See Comments)    History of "ulcerative colitis"   Warfarin Sodium Other (See Comments)    History of "ulcerative colitis" and "Blood thinning, so contraindicated"    Patient Measurements: Height: 5\' 6"  (167.6 cm) Weight: 59 kg (130 lb) IBW/kg (Calculated) : 59.3 Heparin Dosing Weight: n/a. Use TBW of 59 kg  Vital Signs: Temp: 98.4 F (36.9 C) (02/09 0513) Temp Source: Oral (02/08 2208) BP: 108/46 (02/09 0513) Pulse Rate: 79 (02/09 0513)  Labs: Recent Labs    08/24/21 1726 08/25/21 0634 08/25/21 1731 08/26/21 0315  HGB 12.0 11.4*  --  10.6*  HCT 36.4 34.4*  --  32.1*  PLT 154 145*  --  167  HEPARINUNFRC  --  0.74* 0.22* 0.31  CREATININE 0.89 0.75  --   --      Estimated Creatinine Clearance: 51.4 mL/min (by C-G formula based on SCr of 0.75 mg/dL).  Assessment: Patient is an 81 y.o F who was COVID+ on 08/10/21, presented to the ED on 08/24/21 with c/o discoloration of left foot.  LE doppler on 08/24/21 showed extensive acute LLE DVT.  IVC/iliac doppler also showed acute thrombus involving the left common and external iliac veins.  Pharmacy has been consulted to start heparin drip for VTE treatment.  PMH significant for UC and a history of LLE DVT for which patient was previously prescribed apixaban that was subsequently stopped s/p GIB. Not currently on anticoagulants PTA.   Today, 08/26/21 Heparin level therapeutic 0.31 on heparin 900 units/hr CBC: Hgb and Plt slightly decreased No bleeding or issues with infusion noted Renal function stable - WNL  Goal of Therapy:  Heparin level 0.3-0.7 units/ml Monitor platelets by  anticoagulation protocol: Yes   Plan:  Continue heparin drip at 900 units/hr Check 8 hour confirmatory HL CBC, HL daily while on heparin infusion Monitor for signs of bleeding  Peggyann Juba, PharmD, BCPS Pharmacy: [phone] 08/26/2021,7:44 AM  Addendum: Repeat heparin level remains therapeutic and 0.34.  Continue same rate and f/u plans for anticoagulation after IR procedure.  Peggyann Juba, PharmD, BCPS 08/26/2021 10:21 AM

## 2021-08-27 ENCOUNTER — Inpatient Hospital Stay (HOSPITAL_COMMUNITY): Payer: Medicare HMO

## 2021-08-27 HISTORY — PX: IR US GUIDE VASC ACCESS RIGHT: IMG2390

## 2021-08-27 HISTORY — PX: IR THROMBECT SEC MECH MOD SED: IMG2299

## 2021-08-27 LAB — HEPARIN LEVEL (UNFRACTIONATED): Heparin Unfractionated: 0.37 IU/mL (ref 0.30–0.70)

## 2021-08-27 LAB — CBC
HCT: 32 % — ABNORMAL LOW (ref 36.0–46.0)
Hemoglobin: 10.4 g/dL — ABNORMAL LOW (ref 12.0–15.0)
MCH: 29.5 pg (ref 26.0–34.0)
MCHC: 32.5 g/dL (ref 30.0–36.0)
MCV: 90.9 fL (ref 80.0–100.0)
Platelets: 195 10*3/uL (ref 150–400)
RBC: 3.52 MIL/uL — ABNORMAL LOW (ref 3.87–5.11)
RDW: 14.1 % (ref 11.5–15.5)
WBC: 7.5 10*3/uL (ref 4.0–10.5)
nRBC: 0 % (ref 0.0–0.2)

## 2021-08-27 LAB — BASIC METABOLIC PANEL
Anion gap: 10 (ref 5–15)
BUN: 13 mg/dL (ref 8–23)
CO2: 27 mmol/L (ref 22–32)
Calcium: 8.5 mg/dL — ABNORMAL LOW (ref 8.9–10.3)
Chloride: 100 mmol/L (ref 98–111)
Creatinine, Ser: 0.82 mg/dL (ref 0.44–1.00)
GFR, Estimated: >60 mL/min (ref >60)
Glucose, Bld: 84 mg/dL (ref 70–99)
Potassium: 3.9 mmol/L (ref 3.5–5.1)
Sodium: 137 mmol/L (ref 135–145)

## 2021-08-27 LAB — PROTIME-INR
INR: 1.1 (ref 0.8–1.2)
Prothrombin Time: 14 seconds (ref 11.4–15.2)

## 2021-08-27 MED ORDER — IOHEXOL 300 MG/ML  SOLN
100.0000 mL | Freq: Once | INTRAMUSCULAR | Status: AC | PRN
  Administered 2021-08-27: 75 mL via INTRAVENOUS

## 2021-08-27 MED ORDER — FENTANYL CITRATE (PF) 100 MCG/2ML IJ SOLN
INTRAMUSCULAR | Status: AC
  Filled 2021-08-27: qty 4

## 2021-08-27 MED ORDER — LIDOCAINE HCL 1 % IJ SOLN
INTRAMUSCULAR | Status: AC
  Filled 2021-08-27: qty 20

## 2021-08-27 MED ORDER — LIDOCAINE HCL (PF) 1 % IJ SOLN
INTRAMUSCULAR | Status: AC | PRN
  Administered 2021-08-27: 5 mL

## 2021-08-27 MED ORDER — CLINDAMYCIN PHOSPHATE 900 MG/50ML IV SOLN
INTRAVENOUS | Status: AC
  Administered 2021-08-27: 900 mg via INTRAVENOUS
  Filled 2021-08-27: qty 50

## 2021-08-27 MED ORDER — HEPARIN SODIUM (PORCINE) 1000 UNIT/ML IJ SOLN
INTRAMUSCULAR | Status: AC
  Filled 2021-08-27: qty 10

## 2021-08-27 MED ORDER — MIDAZOLAM HCL 2 MG/2ML IJ SOLN
INTRAMUSCULAR | Status: AC
  Filled 2021-08-27: qty 4

## 2021-08-27 MED ORDER — MIDAZOLAM HCL 2 MG/2ML IJ SOLN
INTRAMUSCULAR | Status: AC | PRN
  Administered 2021-08-27 (×4): .5 mg via INTRAVENOUS
  Administered 2021-08-27: 1 mg via INTRAVENOUS
  Administered 2021-08-27 (×2): .5 mg via INTRAVENOUS

## 2021-08-27 MED ORDER — HEPARIN SODIUM (PORCINE) 1000 UNIT/ML IJ SOLN
INTRAMUSCULAR | Status: AC | PRN
  Administered 2021-08-27: 3000 [IU] via INTRAVENOUS

## 2021-08-27 MED ORDER — CLINDAMYCIN PHOSPHATE 900 MG/50ML IV SOLN: 900.0000 mg | Freq: Once | INTRAVENOUS | Status: DC

## 2021-08-27 MED ORDER — ONDANSETRON HCL 4 MG/2ML IJ SOLN
INTRAMUSCULAR | Status: AC | PRN
  Administered 2021-08-27: 4 mg via INTRAVENOUS

## 2021-08-27 MED ORDER — FENTANYL CITRATE (PF) 100 MCG/2ML IJ SOLN
INTRAMUSCULAR | Status: AC | PRN
  Administered 2021-08-27 (×3): 50 ug via INTRAVENOUS
  Administered 2021-08-27 (×2): 25 ug via INTRAVENOUS

## 2021-08-27 MED ORDER — ONDANSETRON HCL 4 MG/2ML IJ SOLN
INTRAMUSCULAR | Status: AC
  Filled 2021-08-27: qty 2

## 2021-08-27 MED ORDER — HEPARIN SODIUM (PORCINE) 1000 UNIT/ML IJ SOLN
INTRAMUSCULAR | Status: AC | PRN
  Administered 2021-08-27: 2000 [IU] via INTRAVENOUS

## 2021-08-27 NOTE — Progress Notes (Signed)
[place] for heparin Indication: acute DVT  Allergies  Allergen Reactions   Amoxicillin-Pot Clavulanate Diarrhea   Codeine Nausea And Vomiting and Other (See Comments)    "drunk feeling and it puts me on the floor"    Simvastatin Other (See Comments)    "Made my legs hurt terribly bad"   Warfarin Other (See Comments)    History of "ulcerative colitis"   Warfarin Sodium Other (See Comments)    History of "ulcerative colitis" and "Blood thinning, so contraindicated"    Patient Measurements: Height: 5\' 6"  (167.6 cm) Weight: 59 kg (130 lb) IBW/kg (Calculated) : 59.3 Heparin Dosing Weight: n/a. Use TBW of 59 kg  Vital Signs: Temp: 98.9 F (37.2 C) (02/09 2104) Temp Source: Oral (02/09 2104) BP: 124/46 (02/09 2104) Pulse Rate: 81 (02/09 2104)  Labs: Recent Labs    08/24/21 1726 08/25/21 0634 08/25/21 1731 08/26/21 0315 08/26/21 0920 08/27/21 0414  HGB 12.0 11.4*  --  10.6*  --  10.4*  HCT 36.4 34.4*  --  32.1*  --  32.0*  PLT 154 145*  --  167  --  195  LABPROT  --   --   --   --   --  14.0  INR  --   --   --   --   --  1.1  HEPARINUNFRC  --  0.74*   < > 0.31 0.34 0.37  CREATININE 0.89 0.75  --   --   --   --    < > = values in this interval not displayed.     Estimated Creatinine Clearance: 51.4 mL/min (by C-G formula based on SCr of 0.75 mg/dL).  Assessment: Patient is an 81 y.o F who was COVID+ on 08/10/21, presented to the ED on 08/24/21 with c/o discoloration of left foot.  LE doppler on 08/24/21 showed extensive acute LLE DVT.  IVC/iliac doppler also showed acute thrombus involving the left common and external iliac veins.  Pharmacy has been consulted to start heparin drip for VTE treatment.  PMH significant for UC and a history of LLE DVT for which patient was previously prescribed apixaban that was subsequently stopped s/p GIB. Not currently on anticoagulants PTA.   Today, 08/27/21 Heparin level therapeutic 0.37 on  heparin 900 units/hr CBC: Hgb slightly decreased, plts WNL No bleeding or issues with infusion noted Renal function stable - WNL  Goal of Therapy:  Heparin level 0.3-0.7 units/ml Monitor platelets by anticoagulation protocol: Yes   Plan:  Continue heparin drip at 900 units/hr CBC, HL daily while on heparin infusion Monitor for signs of bleeding  Dolly Rias RPh 08/27/2021, 4:51 AM

## 2021-08-27 NOTE — Procedures (Signed)
Vascular and Interventional Radiology Procedure Note  Patient: Ashley Carrillo DOB: [birth year] Medical Record Number: [id number] Note Date/Time: 08/27/21 6:44 PM   Performing Physician: Michaelle Birks, MD Assistant(s): None  Diagnosis: Extensive LLE DVT  Procedure(s):  LEFT LOWER EXTREMITY VENOGRAM,  INTRAVASCULAR ULTRASOUND (IVUS) THROMBECTOMY, ANGIOPLASTY and STENT PLACEMENT   Anesthesia: Conscious Sedation Complications: None Estimated Blood Loss:  225 mL Specimens: Pathology  Findings:  - access via the Left  popliteal vein   - extensive, occlusive LLE  DVT with iliocaval compression - PTS changes with stenoses at L CIV - EIV - 12 mm PTA and 14 x 150 mm Abre stent placement at L CIV - EIV  Plan: - continue Hep gtt and begin transition to PO AC - LLE straight x 2 Hrs - OK to DC when disposition and AC transition complete from a VIR perspective - Pt will follow up with me in Clinic, and for repeat LLE Venous Duplex in 1 month.  Final report to follow once all images are reviewed and compared with previous studies.  See detailed dictation with images in PACS. The patient tolerated the procedure well without incident or complication and was returned to Recovery in stable condition.    Michaelle Birks, MD [facility]   Pager. [place]

## 2021-08-27 NOTE — Progress Notes (Signed)
[facility] PROGRESS NOTE    Progress Note  Ashley Carrillo  WGN:[id number] DOB: 20-Apr-1941 DOA: 08/24/2021 PCP: Ashley Sacramento, MD     Brief Narrative:   Ashley Carrillo is an 81 y.o. female past medical history significant for hyperlipidemia, history of left lower extremity DVT, not on anticoagulation left-sided ulcerative colitis, history of GI bleed trigeminal neuralgia presents for evaluation of left lower extremity swelling and discoloration, she relates her last flare of ulcerative colitis was several years ago, she has been asymptomatic for some time now. Lower extremity Doppler showed extensive acute lower extremity DVT in the left common and external iliac vein on the left.   Assessment/Plan:   Acute deep vein thrombosis (DVT) of iliac vein of left lower extremity (Ashley Carrillo): For tPA and possible stent placement on 08/28/2021. Continue IV heparin. No signs of overt bleeding. Ulcerative colitis flares puts her at risk of developing DVTs  Left sided ulcerative colitis: Currently stable on Lialda i hemoglobin is currently stable,  Currently no symptoms no diarrhea or flares of ulcerative colitis  GERD: Continue Protonix.    DVT prophylaxis: heparin Family Communication:none Status is: Inpatient The patient will require care spanning > 2 midnights and should be moved to inpatient because: Acute DVT with previous history of GI bleed due to ulcerative colitis started on IV heparin IR consulted for thrombectomy     Code Status:     Code Status Orders  (From admission, onward)           Start     Ordered   08/24/21 2320  Full code  Continuous        08/24/21 2320           Code Status History     This patient has a current code status but no historical code status.         IV Access:   Peripheral IV   Procedures and diagnostic studies:   No results found.   Medical Consultants:   None.   Subjective:    Ashley Carrillo no complaints  general exam: In no acute distress. Respiratory system: Good air movement and clear to auscultation. Cardiovascular system: S1 & S2 heard, RRR. No JVD. Gastrointestinal system: Abdomen is nondistended, soft and nontender.  Extremities: No pedal edema. Skin: No rashes, lesions or ulcers Psychiatry: Judgement and insight appear normal. Mood & affect appropriate.  Objective:    Vitals:   08/26/21 1027 08/26/21 1356 08/26/21 2104 08/27/21 0528  BP: (!) 110/49 (!) 117/52 (!) 124/46 (!) 106/53  Pulse: 74 74 81 70  Resp: 18 18 16 18   Temp: 98.9 F (37.2 C) 99 F (37.2 C) 98.9 F (37.2 C) 98.2 F (36.8 C)  TempSrc: Oral Oral Oral Oral  SpO2: 97% 97% 96% 96%  Weight:      Height:       SpO2: 96 %   Intake/Output Summary (Last 24 hours) at 08/27/2021 7846 Last data filed at 08/27/2021 0601 Gross per 24 hour  Intake 540.05 ml  Output 1850 ml  Net -1309.95 ml    Filed Weights   08/24/21 1647  Weight: 59 kg    Exam: General exam: In no acute distress. Respiratory system: Good air movement and clear to auscultation. Cardiovascular system: S1 & S2 heard, RRR. No JVD. Gastrointestinal system: Abdomen is nondistended, soft and nontender.  Extremities: No pedal edema. Skin: No rashes, lesions or ulcers Psychiatry: Judgement and insight appear normal. Mood & affect appropriate.  Data Reviewed:    Labs: Basic Metabolic Panel: Recent Labs  Lab 08/24/21 1726 08/25/21 0634 08/27/21 0414  NA 131* 132* 137  K 4.1 4.2 3.9  CL 96* 98 100  CO2 26 26 27   GLUCOSE 125* 91 84  BUN 20 17 13   CREATININE 0.89 0.75 0.82  CALCIUM 8.5* 8.6* 8.5*    GFR Estimated Creatinine Clearance: 50.1 mL/min (by C-G formula based on SCr of 0.82 mg/dL). Liver Function Tests: No results for input(s): AST, ALT, ALKPHOS, BILITOT, PROT, ALBUMIN in the last 168 hours. No results for input(s): LIPASE, AMYLASE in the last 168 hours. No results for input(s): AMMONIA in the last 168  hours. Coagulation profile Recent Labs  Lab 08/27/21 0414  INR 1.1   COVID-19 Labs  No results for input(s): DDIMER, FERRITIN, LDH, CRP in the last 72 hours.  Lab Results  Component Value Date   SARSCOV2NAA NEGATIVE 08/24/2021   [place] NEGATIVE 08/07/2021    CBC: Recent Labs  Lab 08/24/21 1726 08/25/21 0634 08/26/21 0315 08/27/21 0414  WBC 15.5* 11.5* 9.4 7.5  NEUTROABS 12.3*  --   --   --   HGB 12.0 11.4* 10.6* 10.4*  HCT 36.4 34.4* 32.1* 32.0*  MCV 90.3 89.8 90.9 90.9  PLT 154 145* 167 195    Cardiac Enzymes: No results for input(s): CKTOTAL, CKMB, CKMBINDEX, TROPONINI in the last 168 hours. BNP (last 3 results) No results for input(s): PROBNP in the last 8760 hours. CBG: No results for input(s): GLUCAP in the last 168 hours. D-Dimer: No results for input(s): DDIMER in the last 72 hours. Hgb A1c: No results for input(s): HGBA1C in the last 72 hours. Lipid Profile: No results for input(s): CHOL, HDL, LDLCALC, TRIG, CHOLHDL, LDLDIRECT in the last 72 hours. Thyroid function studies: No results for input(s): TSH, T4TOTAL, T3FREE, THYROIDAB in the last 72 hours.  Invalid input(s): FREET3 Anemia work up: No results for input(s): VITAMINB12, FOLATE, FERRITIN, TIBC, IRON, RETICCTPCT in the last 72 hours. Sepsis Labs: Recent Labs  Lab 08/24/21 1726 08/25/21 0634 08/26/21 0315 08/27/21 0414  WBC 15.5* 11.5* 9.4 7.5    Microbiology Recent Results (from the past 240 hour(s))  Resp Panel by RT-PCR (Flu A&B, Covid) Nasopharyngeal Swab     Status: None   Collection Time: 08/24/21  8:55 PM   Specimen: Nasopharyngeal Swab; Nasopharyngeal(NP) swabs in vial transport medium  Result Value Ref Range Status   SARS Coronavirus 2 by RT PCR NEGATIVE NEGATIVE Final    Comment: (NOTE) SARS-CoV-2 target nucleic acids are NOT DETECTED.  The SARS-CoV-2 RNA is generally detectable in upper respiratory specimens during the acute phase of infection. The  lowest concentration of SARS-CoV-2 viral copies this assay can detect is 138 copies/mL. A negative result does not preclude SARS-Cov-2 infection and should not be used as the sole basis for treatment or other patient management decisions. A negative result may occur with  improper specimen collection/handling, submission of specimen other than nasopharyngeal swab, presence of viral mutation(s) within the areas targeted by this assay, and inadequate number of viral copies(<138 copies/mL). A negative result must be combined with clinical observations, patient history, and epidemiological information. The expected result is Negative.  Fact Sheet for Patients:  EntrepreneurPulse.com.au  Fact Sheet for Healthcare Providers:  IncredibleEmployment.be  This test is no t yet approved or cleared by the [place] FDA and  has been authorized for detection and/or diagnosis of SARS-CoV-2 by FDA under an Emergency Use Authorization (EUA). This EUA will remain  in effect (meaning this test can be used) for the duration of the COVID-19 declaration under Section 564(b)(1) of the Act, 21 U.S.C.section 360bbb-3(b)(1), unless the authorization is terminated  or revoked sooner.       Influenza A by PCR NEGATIVE NEGATIVE Final   Influenza B by PCR NEGATIVE NEGATIVE Final    Comment: (NOTE) The Xpert Xpress SARS-CoV-2/FLU/RSV plus assay is intended as an aid in the diagnosis of influenza from Nasopharyngeal swab specimens and should not be used as a sole basis for treatment. Nasal washings and aspirates are unacceptable for Xpert Xpress SARS-CoV-2/FLU/RSV testing.  Fact Sheet for Patients: EntrepreneurPulse.com.au  Fact Sheet for Healthcare Providers: IncredibleEmployment.be  This test is not yet approved or cleared by the [place] FDA and has been authorized for detection and/or diagnosis of SARS-CoV-2 by FDA under  an Emergency Use Authorization (EUA). This EUA will remain in effect (meaning this test can be used) for the duration of the COVID-19 declaration under Section 564(b)(1) of the Act, 21 U.S.C. section 360bbb-3(b)(1), unless the authorization is terminated or revoked.  Performed at [facility], [place] [address], [place], [place] [address]      Medications:    mesalamine  2.4 g Oral BID WC   pantoprazole  20 mg Oral QAC breakfast   sodium chloride flush  3 mL Intravenous Q12H   Continuous Infusions:  heparin 900 Units/hr (08/27/21 0356)      LOS: 2 days   Charlynne Cousins  [facility]  08/27/2021, 8:22 AM

## 2021-08-28 LAB — CBC
HCT: 30.8 % — ABNORMAL LOW (ref 36.0–46.0)
Hemoglobin: 10 g/dL — ABNORMAL LOW (ref 12.0–15.0)
MCH: 29.9 pg (ref 26.0–34.0)
MCHC: 32.5 g/dL (ref 30.0–36.0)
MCV: 92.2 fL (ref 80.0–100.0)
Platelets: 192 10*3/uL (ref 150–400)
RBC: 3.34 MIL/uL — ABNORMAL LOW (ref 3.87–5.11)
RDW: 14 % (ref 11.5–15.5)
WBC: 6.4 10*3/uL (ref 4.0–10.5)
nRBC: 0 % (ref 0.0–0.2)

## 2021-08-28 LAB — HEPARIN LEVEL (UNFRACTIONATED): Heparin Unfractionated: 0.57 IU/mL (ref 0.30–0.70)

## 2021-08-28 MED ORDER — POLYETHYLENE GLYCOL 3350 17 G PO PACK: 17.0000 g | PACK | Freq: Two times a day (BID) | ORAL | Status: DC

## 2021-08-28 MED ORDER — APIXABAN 5 MG PO TABS
5.0000 mg | ORAL_TABLET | Freq: Two times a day (BID) | ORAL | Status: DC
  Administered 2021-08-28 – 2021-08-29 (×3): 5 mg via ORAL
  Filled 2021-08-28 (×3): qty 1

## 2021-08-28 MED ORDER — POLYETHYLENE GLYCOL 3350 17 G PO PACK
17.0000 g | PACK | Freq: Every day | ORAL | Status: DC
  Administered 2021-08-28: 17 g via ORAL
  Filled 2021-08-28 (×2): qty 1

## 2021-08-28 NOTE — Discharge Instructions (Signed)
Information on my medicine - ELIQUIS (apixaban)   Why was Eliquis prescribed for you? Eliquis was prescribed to treat blood clots that may have been found in the veins of your legs (deep vein thrombosis) or in your lungs (pulmonary embolism) and to reduce the risk of them occurring again.  What do You need to know about Eliquis ? The dose is 5 mg tablet taken TWICE daily.  Eliquis may be taken with or without food.   Try to take the dose about the same time in the morning and in the evening. If you have difficulty swallowing the tablet whole please discuss with your pharmacist how to take the medication safely.  Take Eliquis exactly as prescribed and DO NOT stop taking Eliquis without talking to the doctor who prescribed the medication.  Stopping may increase your risk of developing a new blood clot.  Refill your prescription before you run out.  After discharge, you should have regular check-up appointments with your healthcare provider that is prescribing your Eliquis.    What do you do if you miss a dose? If a dose of ELIQUIS is not taken at the scheduled time, take it as soon as possible on the same day and twice-daily administration should be resumed. The dose should not be doubled to make up for a missed dose.  Important Safety Information A possible side effect of Eliquis is bleeding. You should call your healthcare provider right away if you experience any of the following: Bleeding from an injury or your nose that does not stop. Unusual colored urine (red or dark brown) or unusual colored stools (red or black). Unusual bruising for unknown reasons. A serious fall or if you hit your head (even if there is no bleeding).  Some medicines may interact with Eliquis and might increase your risk of bleeding or clotting while on Eliquis. To help avoid this, consult your healthcare provider or pharmacist prior to using any new prescription or non-prescription medications,  including herbals, vitamins, non-steroidal anti-inflammatory drugs (NSAIDs) and supplements.  This website has more information on Eliquis (apixaban): http://www.eliquis.com/eliquis/home

## 2021-08-28 NOTE — Progress Notes (Signed)
[facility] PROGRESS NOTE    Progress Note  TAKEELA PEIL  YQI:[id number] DOB: 08-20-1940 DOA: 08/24/2021 PCP: [name], MD     Brief Narrative:   Ashley Carrillo is an 81 y.o. female past medical history significant for hyperlipidemia, history of left lower extremity DVT, not on anticoagulation left-sided ulcerative colitis, history of GI bleed trigeminal neuralgia presents for evaluation of left lower extremity swelling and discoloration, she relates her last flare of ulcerative colitis was several years ago, she has been asymptomatic for some time now. Lower extremity Doppler showed extensive acute lower extremity DVT in the left common and external iliac vein on the left.   Assessment/Plan:   Acute deep vein thrombosis (DVT) of iliac vein of left lower extremity [facility]): Status post intravascular thrombectomy with angioplasty and stent placement by IR. Heparin we will go ahead and transition to oral Eliquis. She will need a lower extremity Doppler in 1 month. No signs of overt bleeding. Has not had a bowel movement oral MiraLAX  Left sided ulcerative colitis: Currently stable on Lialda i hemoglobin is currently stable,  Currently no symptoms no diarrhea or flares of ulcerative colitis  GERD: Continue Protonix.    DVT prophylaxis: heparin Family Communication:none Status is: Inpatient The patient will require care spanning > 2 midnights and should be moved to inpatient because: Acute DVT with previous history of GI bleed due to ulcerative colitis started on IV heparin IR consulted for thrombectomy     Code Status:     Code Status Orders  (From admission, onward)           Start     Ordered   08/24/21 2320  Full code  Continuous        08/24/21 2320           Code Status History     This patient has a current code status but no historical code status.         IV Access:   Peripheral IV   Procedures and diagnostic studies:   No  results found.   Medical Consultants:   None.   Subjective:    [name] has no new complaints has not had a bowel movement.  Objective:    Vitals:   08/27/21 1932 08/27/21 2049 08/28/21 0121 08/28/21 0616  BP: (!) 112/44 (!) 118/41 (!) 108/44 (!) 109/44  Pulse: 72 72 68 70  Resp: 18 16 16 18   Temp: 97.8 F (36.6 C)   98.3 F (36.8 C)  TempSrc:    Oral  SpO2: 100% 100% 99% 97%  Weight:      Height:       SpO2: 97 % O2 Flow Rate (L/min): 2 L/min  General exam: In no acute distress. Respiratory system: Good air movement and clear to auscultation. Cardiovascular system: S1 & S2 heard, RRR. No JVD. Gastrointestinal system: Abdomen is nondistended, soft and nontender.  Extremities: No pedal edema. Skin: No rashes, lesions or ulcers Psychiatry: Judgement and insight appear normal. Mood & affect appropriate.   Intake/Output Summary (Last 24 hours) at 08/28/2021 0715 Last data filed at 08/28/2021 0655 Gross per 24 hour  Intake 450.62 ml  Output 500 ml  Net -49.38 ml    Filed Weights   08/24/21 1647  Weight: 59 kg    Exam: General exam: In no acute distress. Respiratory system: Good air movement and clear to auscultation. Cardiovascular system: S1 & S2 heard, RRR. No JVD. Gastrointestinal system: Abdomen is  nondistended, soft and nontender.  Extremities: No pedal edema. Skin: No rashes, lesions or ulcers Psychiatry: Judgement and insight appear normal. Mood & affect appropriate.   Data Reviewed:    Labs: Basic Metabolic Panel: Recent Labs  Lab 08/24/21 1726 08/25/21 0634 08/27/21 0414  NA 131* 132* 137  K 4.1 4.2 3.9  CL 96* 98 100  CO2 26 26 27   GLUCOSE 125* 91 84  BUN 20 17 13   CREATININE 0.89 0.75 0.82  CALCIUM 8.5* 8.6* 8.5*    GFR Estimated Creatinine Clearance: 50.1 mL/min (by C-G formula based on SCr of 0.82 mg/dL). Liver Function Tests: No results for input(s): AST, ALT, ALKPHOS, BILITOT, PROT, ALBUMIN in the last 168  hours. No results for input(s): LIPASE, AMYLASE in the last 168 hours. No results for input(s): AMMONIA in the last 168 hours. Coagulation profile Recent Labs  Lab 08/27/21 0414  INR 1.1    COVID-19 Labs  No results for input(s): DDIMER, FERRITIN, LDH, CRP in the last 72 hours.  Lab Results  Component Value Date   SARSCOV2NAA NEGATIVE 08/24/2021   [place] NEGATIVE 08/07/2021    CBC: Recent Labs  Lab 08/24/21 1726 08/25/21 0634 08/26/21 0315 08/27/21 0414 08/28/21 0415  WBC 15.5* 11.5* 9.4 7.5 6.4  NEUTROABS 12.3*  --   --   --   --   HGB 12.0 11.4* 10.6* 10.4* 10.0*  HCT 36.4 34.4* 32.1* 32.0* 30.8*  MCV 90.3 89.8 90.9 90.9 92.2  PLT 154 145* 167 195 192    Cardiac Enzymes: No results for input(s): CKTOTAL, CKMB, CKMBINDEX, TROPONINI in the last 168 hours. BNP (last 3 results) No results for input(s): PROBNP in the last 8760 hours. CBG: No results for input(s): GLUCAP in the last 168 hours. D-Dimer: No results for input(s): DDIMER in the last 72 hours. Hgb A1c: No results for input(s): HGBA1C in the last 72 hours. Lipid Profile: No results for input(s): CHOL, HDL, LDLCALC, TRIG, CHOLHDL, LDLDIRECT in the last 72 hours. Thyroid function studies: No results for input(s): TSH, T4TOTAL, T3FREE, THYROIDAB in the last 72 hours.  Invalid input(s): FREET3 Anemia work up: No results for input(s): VITAMINB12, FOLATE, FERRITIN, TIBC, IRON, RETICCTPCT in the last 72 hours. Sepsis Labs: Recent Labs  Lab 08/25/21 0634 08/26/21 0315 08/27/21 0414 08/28/21 0415  WBC 11.5* 9.4 7.5 6.4    Microbiology Recent Results (from the past 240 hour(s))  Resp Panel by RT-PCR (Flu A&B, Covid) Nasopharyngeal Swab     Status: None   Collection Time: 08/24/21  8:55 PM   Specimen: Nasopharyngeal Swab; Nasopharyngeal(NP) swabs in vial transport medium  Result Value Ref Range Status   SARS Coronavirus 2 by RT PCR NEGATIVE NEGATIVE Final    Comment: (NOTE) SARS-CoV-2 target  nucleic acids are NOT DETECTED.  The SARS-CoV-2 RNA is generally detectable in upper respiratory specimens during the acute phase of infection. The lowest concentration of SARS-CoV-2 viral copies this assay can detect is 138 copies/mL. A negative result does not preclude SARS-Cov-2 infection and should not be used as the sole basis for treatment or other patient management decisions. A negative result may occur with  improper specimen collection/handling, submission of specimen other than nasopharyngeal swab, presence of viral mutation(s) within the areas targeted by this assay, and inadequate number of viral copies(<138 copies/mL). A negative result must be combined with clinical observations, patient history, and epidemiological information. The expected result is Negative.  Fact Sheet for Patients:  EntrepreneurPulse.com.au  Fact Sheet for Healthcare Providers:  IncredibleEmployment.be  This test is no t yet approved or cleared by the [place] and  has been authorized for detection and/or diagnosis of SARS-CoV-2 by FDA under an Emergency Use Authorization (EUA). This EUA will remain  in effect (meaning this test can be used) for the duration of the COVID-19 declaration under Section 564(b)(1) of the Act, 21 U.S.C.section 360bbb-3(b)(1), unless the authorization is terminated  or revoked sooner.       Influenza A by PCR NEGATIVE NEGATIVE Final   Influenza B by PCR NEGATIVE NEGATIVE Final    Comment: (NOTE) The Xpert Xpress SARS-CoV-2/FLU/RSV plus assay is intended as an aid in the diagnosis of influenza from Nasopharyngeal swab specimens and should not be used as a sole basis for treatment. Nasal washings and aspirates are unacceptable for Xpert Xpress SARS-CoV-2/FLU/RSV testing.  Fact Sheet for Patients: EntrepreneurPulse.com.au  Fact Sheet for Healthcare  Providers: IncredibleEmployment.be  This test is not yet approved or cleared by the [place] FDA and has been authorized for detection and/or diagnosis of SARS-CoV-2 by FDA under an Emergency Use Authorization (EUA). This EUA will remain in effect (meaning this test can be used) for the duration of the COVID-19 declaration under Section 564(b)(1) of the Act, 21 U.S.C. section 360bbb-3(b)(1), unless the authorization is terminated or revoked.  Performed at [facility], [place] [address] [place], [place] [address]      Medications:    mesalamine  2.4 g Oral BID WC   pantoprazole  20 mg Oral QAC breakfast   sodium chloride flush  3 mL Intravenous Q12H   Continuous Infusions:  clindamycin (CLEOCIN) IV     heparin 900 Units/hr (08/28/21 0655)      LOS: 3 days   Charlynne Cousins  [facility]  08/28/2021, 7:15 AM

## 2021-08-28 NOTE — Progress Notes (Signed)
[place] for heparin >> Eliquis Indication: acute DVT  Allergies  Allergen Reactions   Amoxicillin-Pot Clavulanate Diarrhea   Codeine Nausea And Vomiting and Other (See Comments)    "drunk feeling and it puts me on the floor"    Simvastatin Other (See Comments)    "Made my legs hurt terribly bad"   Warfarin Other (See Comments)    History of "ulcerative colitis"   Warfarin Sodium Other (See Comments)    History of "ulcerative colitis" and "Blood thinning, so contraindicated"    Patient Measurements: Height: 5\' 6"  (167.6 cm) Weight: 59 kg (130 lb) IBW/kg (Calculated) : 59.3 Heparin Dosing Weight: n/a. Use TBW of 59 kg  Vital Signs: Temp: 98.3 F (36.8 C) (02/11 0616) Temp Source: Oral (02/11 0616) BP: 109/44 (02/11 0616) Pulse Rate: 70 (02/11 0616)  Labs: Recent Labs    08/26/21 0315 08/26/21 0920 08/27/21 0414 08/28/21 0415  HGB 10.6*  --  10.4* 10.0*  HCT 32.1*  --  32.0* 30.8*  PLT 167  --  195 192  LABPROT  --   --  14.0  --   INR  --   --  1.1  --   HEPARINUNFRC 0.31 0.34 0.37 0.57  CREATININE  --   --  0.82  --      Estimated Creatinine Clearance: 50.1 mL/min (by C-G formula based on SCr of 0.82 mg/dL).  Assessment: Patient is an 81 y.o F who was COVID+ on 08/10/21, presented to the ED on 08/24/21 with c/o discoloration of left foot.  LE doppler on 08/24/21 showed extensive acute LLE DVT.  IVC/iliac doppler also showed acute thrombus involving the left common and external iliac veins.  Pharmacy has been consulted to start heparin drip for VTE treatment.  PMH significant for UC and a history of LLE DVT for which patient was previously prescribed apixaban that was subsequently stopped s/p GIB. Not currently on anticoagulants PTA.   Patient underwent L lower extremity venogram, thrombectomy, angioplasty, and stent placement with IR on 2/10.    Today, 08/28/21 Pharmacy consulted to transition heparin drip to Eliquis. Today  is day 5 of anticoagulation with heparin. Given patient history with GIB on anticoagulation, will begin maintenance dosing of Eliquis. CBC: Hgb slightly decreased, plts WNL Renal function stable - WNL  Goal of Therapy:  Heparin level 0.3-0.7 units/ml Monitor platelets by anticoagulation protocol: Yes   Plan:  Discontinue heparin drip Begin Eliquis 5mg  PO BID Monitor for signs of bleeding  Pharmacy will formally sign off consult but continue to monitor peripherally.  Dimple Nanas, PharmD 08/28/2021 7:32 AM

## 2021-08-28 NOTE — Progress Notes (Signed)
[place] for heparin Indication: acute DVT  Allergies  Allergen Reactions   Amoxicillin-Pot Clavulanate Diarrhea   Codeine Nausea And Vomiting and Other (See Comments)    "drunk feeling and it puts me on the floor"    Simvastatin Other (See Comments)    "Made my legs hurt terribly bad"   Warfarin Other (See Comments)    History of "ulcerative colitis"   Warfarin Sodium Other (See Comments)    History of "ulcerative colitis" and "Blood thinning, so contraindicated"    Patient Measurements: Height: 5\' 6"  (167.6 cm) Weight: 59 kg (130 lb) IBW/kg (Calculated) : 59.3 Heparin Dosing Weight: n/a. Use TBW of 59 kg  Vital Signs: Temp: 97.8 F (36.6 C) (02/10 1932) Temp Source: Oral (02/10 1840) BP: 108/44 (02/11 0121) Pulse Rate: 68 (02/11 0121)  Labs: Recent Labs    08/25/21 8127 08/25/21 1731 08/26/21 0315 08/26/21 0920 08/27/21 0414 08/28/21 0415  HGB 11.4*  --  10.6*  --  10.4* 10.0*  HCT 34.4*  --  32.1*  --  32.0* 30.8*  PLT 145*  --  167  --  195 192  LABPROT  --   --   --   --  14.0  --   INR  --   --   --   --  1.1  --   HEPARINUNFRC 0.74*   < > 0.31 0.34 0.37 0.57  CREATININE 0.75  --   --   --  0.82  --    < > = values in this interval not displayed.     Estimated Creatinine Clearance: 50.1 mL/min (by C-G formula based on SCr of 0.82 mg/dL).  Assessment: Patient is an 81 y.o F who was COVID+ on 08/10/21, presented to the ED on 08/24/21 with c/o discoloration of left foot.  LE doppler on 08/24/21 showed extensive acute LLE DVT.  IVC/iliac doppler also showed acute thrombus involving the left common and external iliac veins.  Pharmacy has been consulted to start heparin drip for VTE treatment.  PMH significant for UC and a history of LLE DVT for which patient was previously prescribed apixaban that was subsequently stopped s/p GIB. Not currently on anticoagulants PTA.   Today, 08/28/21 Heparin level therapeutic 0.57 on  heparin 900 units/hr CBC: Hgb slightly decreased, plts WNL No bleeding or issues with infusion per RN Renal function stable - WNL  Goal of Therapy:  Heparin level 0.3-0.7 units/ml Monitor platelets by anticoagulation protocol: Yes   Plan:  Continue heparin drip at 900 units/hr CBC, HL daily while on heparin infusion Monitor for signs of bleeding  Dolly Rias RPh 08/28/2021, 5:30 AM

## 2021-08-28 NOTE — Progress Notes (Signed)
Supervising Physician: Mir, Sharen Heck  Patient Status:  [facility] - In-pt  Chief Complaint:  DVT LLE  Subjective:  1 day s/p LLE angioplasty and stent placement. Feeling tired this morning.  Says Covid has taken a lot of energy from her.  Knee hurts, attributes this to needing replacement.  Worried about Eliquis as she has experienced bleeding in the past. Denies SOB, CP, blurred vision, dizziness, or headache.    Allergies: Amoxicillin-pot clavulanate, Codeine, Simvastatin, Warfarin, and Warfarin sodium  Medications: Prior to Admission medications   Medication Sig Start Date End Date Taking? Authorizing Provider  cholecalciferol (VITAMIN D3) 25 MCG (1000 UNIT) tablet Take 1,000 Units by mouth daily.   Yes [provider]  cyanocobalamin 1000 MCG tablet Take 1,000 mcg by mouth daily.   Yes [provider]  hydrocortisone 2.5 % ointment Apply 1 application topically daily as needed (to affected area).   Yes [provider]  mesalamine (LIALDA) 1.2 g EC tablet Take 4 tablets (4.8 g total) by mouth daily with breakfast. You are overdue for an appointment. Please call to schedule asap. Appointment needed for further refills. Patient taking differently: Take 2.4 g by mouth 2 (two) times daily with a meal. 07/28/21  Yes Armbruster, Carlota Raspberry, MD  ondansetron (ZOFRAN-ODT) 4 MG disintegrating tablet 53m ODT q4 hours prn nausea/vomit Patient taking differently: Take 4 mg by mouth every 4 (four) hours as needed for nausea or vomiting (dissolve orally). 08/15/21  Yes YDrenda Freeze MD  pantoprazole (PROTONIX) 20 MG tablet Take 1 tablet (20 mg total) by mouth daily. Patient taking differently: Take 20 mg by mouth daily before breakfast. 02/10/21  Yes Armbruster, SCarlota Raspberry MD     Vital Signs: BP (!) 106/54 (BP Location: Right Arm)    Pulse 71    Temp 97.7 F (36.5 C) (Oral)    Resp 20    Ht '5\' 6"'  (1.676 m)    Wt 130 lb (59 kg)    SpO2 98%    BMI 20.98 kg/m    Physical Exam HENT:     Head: Normocephalic.     Mouth/Throat:     Pharynx: Oropharynx is clear.  Eyes:     Extraocular Movements: Extraocular movements intact.  Cardiovascular:     Rate and Rhythm: Normal rate.     Pulses: Normal pulses.  Pulmonary:     Effort: Pulmonary effort is normal.  Skin:    General: Skin is warm and dry.     Capillary Refill: Capillary refill takes 2 to 3 seconds.     Comments: Left popliteal fossa with bandage in place.  C/D/I.  Entry site soft and without hematoma, redness, or swelling  Neurological:     General: No focal deficit present.     Mental Status: She is alert and oriented to person, place, and time.  Psychiatric:        Mood and Affect: Mood normal.        Behavior: Behavior normal.    Imaging: IR THROMBECT [facility] SED  Result Date: 08/28/2021 INDICATION: Extensive LEFT lower extremity DVT. 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 EXAM: Procedures: 1. ULTRASOUND GUIDANCE FOR LEFT POPLITEAL VEIN ACCESS 2. LEFT LOWER EXTREMITY VENOGRAM and INFERIOR VENA CAVAGRAM 3. MECHANICAL THROMBECTOMY, ANGIOPLASTY AND LEFT ILIAC VEIN STENT PLACEMENT 4. INTRAVASCULAR ULTRASOUND (IVUS) COMPARISON:  CT venogram, 08/24/2021 MEDICATIONS: 900 mg clindamycin was administered as preprocedure antibiotics. 4 mg Zofran IV. 5 K units heparin IV. Moderate (conscious) sedation was employed during  this procedure. A total of Versed 4 mg and Fentanyl 100 mcg was administered intravenously. CONTRAST:  36m OMNIPAQUE IOHEXOL 300 MG/ML  SOLN ANESTHESIA/SEDATION: Moderate Sedation Time: 122 minutes. The patient's level of consciousness and vital signs were monitored continuously by radiology nursing throughout the procedure under my direct supervision. FLUOROSCOPY TIME:  23 minutes.  0 seconds.  30 mGy COMPLICATIONS: None immediate. TECHNIQUE: Informed written consent was obtained from the patient and/or patient's representative after a discussion of the risks, benefits  and alternatives to treatment. Questions regarding the procedure were encouraged and answered. A timeout was performed prior to the initiation of the procedure. The patient was placed supine on the fluoroscopy table and the skin posterior to the left knee was prepped and draped in the usual sterile fashion, and a sterile drape was applied covering the operative field. Maximum barrier sterile technique with sterile gowns and gloves were used for the procedure. Under direct ultrasound guidance, the LEFT popliteal vein was access with a micropuncture kit after the overlying soft tissues were anesthetized with 1% lidocaine. An ultrasound image was saved for documentation purposed. This allowed for placement of a 5 Fr vascular sheath. A limited venogram was performed through the side arm of the vascular sheath. With the use of a glidewire, a Kumpe catheter was advanced through the LEFT femoral, external iliac vein and through the left common iliac vein to the level of the IVC. Limited venograms were performed in multiple stations. An inferior venacavagram was performed. The Glidewire was exchanged for a Amplatz super stiff wire, the sheath was exchanged for a tapered 11 Fr sheath, then a Penumbra 12 Fr lightning catheter was used to perform serial mechanical thrombectomy from the popliteal vein to the site of iliocaval compression. Follow-up venograms demonstrated adequate clearance then intravascular ultrasound was inserted for venous mapping and sizing for stent placement. Images were saved and will be uploaded PACS. Pre dilatation of the vein was performed using a 12 mm atlas balloon. Over the wire, a 14 x 150 mm Abre venous stent was advanced just past the site of iliocaval compression and deployed using fluoroscopic guidance. Post dilatation for stent seating was performed, then completion venogram was acquired. The wire and vascular sheath removed. Manual compression at the LEFT knee was performed for hemostasis. A  dressing was placed. The patient tolerated the procedure well without immediate postprocedural complication. FINDINGS: Initial ultrasound scanning demonstrates the LEFT popliteal vein is enlarged and filled with echogenic and non compressible thrombus. Extensive LEFT lower extremity DVT, with proximal extension to the site of iliocaval compression. Post thrombectomy findings of thickened iliac vein on intravascular ultrasound (IVUS), consistent with post thrombotic syndrome (PTS). Technique successful deployment of 14 x 150 mm Abre venous stent across the site of compression, with peripheral extension proximal to the inguinal ligament. Pre-procedure venogram without opacification of pelvic veins secondary to thrombus burden. Postprocedure venogram with widely patent stented LEFT iliac veins, into the IVC. IMPRESSION: Successful LEFT lower extremity venogram, mechanical thrombectomy and iliac vein stent placement for iliocaval compression and extensive DVT, as above. PLAN: 1. Continue heparin gtt and okay to transition to p.o. anticoagulation. 2. Okay to discharge from a vascular interventional radiology (VIR) perspective. 3. [facility] venous [facility] and follow-up in 1 month. JMichaelle Birks MD [facility] Electronically Signed   By: JMichaelle BirksM.D.   On: 08/28/2021 08:30   IR [place] Vasc Access Right  Result Date: 08/28/2021 INDICATION: Extensive LEFT lower extremity DVT. 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 EXAM: Procedures: 1. ULTRASOUND GUIDANCE FOR LEFT POPLITEAL VEIN ACCESS 2. LEFT LOWER EXTREMITY VENOGRAM and INFERIOR VENA CAVAGRAM 3. MECHANICAL THROMBECTOMY, ANGIOPLASTY AND LEFT ILIAC VEIN STENT PLACEMENT 4. INTRAVASCULAR ULTRASOUND (IVUS) COMPARISON:  CT venogram, 08/24/2021 MEDICATIONS: 900 mg clindamycin was administered as preprocedure antibiotics. 4 mg Zofran IV. 5 K units  heparin IV. Moderate (conscious) sedation was employed during this procedure. A total of Versed 4 mg and Fentanyl 100 mcg was administered intravenously. CONTRAST:  10m OMNIPAQUE IOHEXOL 300 MG/ML  SOLN ANESTHESIA/SEDATION: Moderate Sedation Time: 122 minutes. The patient's level of consciousness and vital signs were monitored continuously by radiology nursing throughout the procedure under my direct supervision. FLUOROSCOPY TIME:  23 minutes.  0 seconds.  30 mGy COMPLICATIONS: None immediate. TECHNIQUE: Informed written consent was obtained from the patient and/or patient's representative after a discussion of the risks, benefits and alternatives to treatment. Questions regarding the procedure were encouraged and answered. A timeout was performed prior to the initiation of the procedure. The patient was placed supine on the fluoroscopy table and the skin posterior to the left knee was prepped and draped in the usual sterile fashion, and a sterile drape was applied covering the operative field. Maximum barrier sterile technique with sterile gowns and gloves were used for the procedure. Under direct ultrasound guidance, the LEFT popliteal vein was access with a micropuncture kit after the overlying soft tissues were anesthetized with 1% lidocaine. An ultrasound image was saved for documentation purposed. This allowed for placement of a 5 Fr vascular sheath. A limited venogram was performed through the side arm of the vascular sheath. With the use of a glidewire, a Kumpe catheter was advanced through the LEFT femoral, external iliac vein and through the left common iliac vein to the level of the IVC. Limited venograms were performed in multiple stations. An inferior venacavagram was performed. The Glidewire was exchanged for a Amplatz super stiff wire, the sheath was exchanged for a tapered 11 Fr sheath, then a Penumbra 12 Fr lightning catheter was used to perform serial mechanical thrombectomy from the popliteal  vein to the site of iliocaval compression. Follow-up venograms demonstrated adequate clearance then intravascular ultrasound was inserted for venous mapping and sizing for stent placement. Images were saved and will be uploaded PACS. Pre dilatation of the vein was performed using a 12 mm atlas balloon. Over the wire, a 14 x 150 mm Abre venous stent was advanced just past the site of iliocaval compression and deployed using fluoroscopic guidance. Post dilatation for stent seating was performed, then completion venogram was acquired. The wire and vascular sheath removed. Manual compression at the LEFT knee was performed for hemostasis. A dressing was placed. The patient tolerated the procedure well without immediate postprocedural complication. FINDINGS: Initial ultrasound scanning demonstrates the LEFT popliteal vein is enlarged and filled with echogenic and non compressible thrombus. Extensive LEFT lower extremity DVT, with proximal extension to the site of iliocaval compression. Post thrombectomy findings of thickened iliac vein on intravascular ultrasound (IVUS), consistent with post thrombotic syndrome (PTS). Technique successful deployment of 14 x 150 mm Abre venous stent across the site of compression, with peripheral extension proximal to the inguinal ligament. Pre-procedure venogram without opacification of pelvic veins secondary to thrombus burden. Postprocedure venogram with widely patent stented LEFT iliac veins, into the IVC. IMPRESSION: Successful LEFT lower extremity venogram, mechanical thrombectomy and iliac vein stent placement for iliocaval compression and extensive DVT, as above.  PLAN: 1. Continue heparin gtt and okay to transition to p.o. anticoagulation. 2. Okay to discharge from a vascular interventional radiology (VIR) perspective. 3. The patient will be seen in [facility] for repeat lower extremity venous duplex and follow-up in 1 month. Michaelle Birks, MD [facility] Inter[facility] Radiology  Special[facility] Radiology Electronically Signed   By: Michaelle Birks M.D.   On: 08/28/2021 08:30   VAS [place] IVC/ILIAC (VENOUS ONLY)  Result Date: 08/25/2021 IVC/ILIAC STUDY Patient Name:  MCKALA PANTALEON  Date of Exam:   08/24/2021 Medical Rec #: [id number]        Accession #:    [id number] Date of Birth: 1941-07-10        Patient Gender: F Patient Age:   81 years Exam Location:  [facility] Procedure:      VAS [place] IVC/ILIAC (VENOUS ONLY) Referring Phys: Wille Glaser KNAPP --------------------------------------------------------------------------------  Indications: Extensive acute DVT of the left lower extremity with extension              above the inguinal ligament Limitations: Air/bowel gas.  Comparison Study: 08-24-2021 Left lower extremity venous duplex study Performing Technologist: Darlin Coco RDMS, RVT  Examination Guidelines: A complete evaluation includes B-mode imaging, spectral Doppler, color Doppler, and power Doppler as needed of all accessible portions of each vessel. Bilateral testing is considered an integral part of a complete examination. Limited examinations for reoccurring indications may be performed as noted.  IVC/Iliac Findings: +----------+------+--------+--------+     IVC     Patent Thrombus Comments  +----------+------+--------+--------+  IVC Prox   patent                    +----------+------+--------+--------+  IVC Mid    patent                    +----------+------+--------+--------+  IVC Distal patent                    +----------+------+--------+--------+  +-------------------+---------+-----------+---------+-----------+--------+          CIV         RT-Patent RT-Thrombus LT-Patent LT-Thrombus Comments  +-------------------+---------+-----------+---------+-----------+--------+  Common Iliac Prox                                      acute              +-------------------+---------+-----------+---------+-----------+--------+  Common Iliac Mid                                        acute              +-------------------+---------+-----------+---------+-----------+--------+  Common Iliac Distal                                    acute              +-------------------+---------+-----------+---------+-----------+--------+  +-------------------------+---------+-----------+---------+-----------+--------+             EIV            RT-Patent RT-Thrombus LT-Patent LT-Thrombus Comments  +-------------------------+---------+-----------+---------+-----------+--------+  External Iliac Vein Prox  acute              +-------------------------+---------+-----------+---------+-----------+--------+  External Iliac Vein Mid                                      acute              +-------------------------+---------+-----------+---------+-----------+--------+  External Iliac Vein                                          acute               Distal                                                                          +-------------------------+---------+-----------+---------+-----------+--------+   Summary: IVC/Iliac: There is no evidence of thrombus involving the IVC. There is evidence of acute thrombus involving the left common iliac vein. There is evidence of acute thrombus involving the left external iliac vein. Visualized segments of the right iliac veins appear patent.  *See table(s) above for measurements and observations.  Electronically signed by Harold Barban MD on 08/25/2021 at 8:53:21 PM.    Final    CT VENOGRAM ABD/PEL  Result Date: 08/24/2021 CLINICAL DATA:  History of blood clots, left foot discoloration, deep venous thrombosis EXAM: CT VENOGRAM ABDOMEN AND PELVIS TECHNIQUE: Multidetector CT imaging of the abdomen was performed using the standard protocol following bolus administration of intravenous contrast, with imaging performed during the portal venous and delayed venous phases of contrast enhancement. RADIATION DOSE REDUCTION: This exam was performed  according to the departmental dose-optimization program which includes automated exposure control, adjustment of the mA and/or kV according to patient size and/or use of iterative reconstruction technique. CONTRAST:  154m OMNIPAQUE IOHEXOL 350 MG/ML SOLN COMPARISON:  None. FINDINGS: Lower chest: No acute pleural or parenchymal lung disease. Small hiatal hernia. Hepatobiliary: No focal liver abnormality is seen. No gallstones, gallbladder wall thickening, or biliary dilatation. Pancreas: Unremarkable. No pancreatic ductal dilatation or surrounding inflammatory changes. Spleen: Normal in size without focal abnormality. Adrenals/Urinary Tract: There are bilateral peripelvic renal cysts. Kidneys enhance normally and symmetrically. No urinary tract calculi or obstruction. Bladder is unremarkable. Adrenals are normal. Stomach/Bowel: No bowel obstruction or ileus. No bowel wall thickening or inflammatory change. Vascular/Lymphatic: There is enlargement and occlusive thrombus involving the left left common iliac, external iliac, common femoral, and visualized portions of the superficial and profundus femoral veins, consistent with acute DVT. Diffuse atherosclerosis of the aorta. No pathologic adenopathy of the abdomen or pelvis. Reproductive: Uterus and bilateral adnexa are unremarkable. Other: No free fluid or free gas.  No abdominal wall hernia. Musculoskeletal: There is diffuse subcutaneous edema within the visualized left lower extremity consistent with acute DVT. No acute or destructive bony lesions. 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 Reconstructed images demonstrate no additional findings. IMPRESSION: 1. Acute occlusive deep venous thrombosis involving the left common iliac, external iliac, common femoral, femoral, and profundus femoral veins as above. 2. Subcutaneous edema throughout the  left lower extremity. 3. Small hiatal hernia. 4.  Aortic Atherosclerosis (ICD10-I70.0).  Critical Value/emergent results were called by telephone at the time of interpretation on 08/24/2021 at 10:38 pm to provider [facility] , who verbally acknowledged these results. Electronically Signed   By: Randa Ngo M.D.   On: 08/24/2021 22:39   VAS [place] LOWER EXTREMITY VENOUS (DVT) (7a-7p)  Result Date: 08/25/2021  Lower Venous DVT Study Patient Name:  MINDI AKERSON  Date of Exam:   08/24/2021 Medical Rec #: [id number]        Accession #:    [id number] Date of Birth: 24-Jun-1941        Patient Gender: F Patient Age:   61 years Exam [facility] Procedure:      VAS [place] LOWER EXTREMITY VENOUS (DVT) Referring Phys: Charmaine Downs --------------------------------------------------------------------------------  Indications: History of DVT of the left lower extremity, recent Covid-19 (08/10/21), pain, swelling, and discoloration to left lower extremity.  Comparison Study: No prior studies available. Performing Technologist: Darlin Coco RDMS, RVT  Examination Guidelines: A complete evaluation includes B-mode imaging, spectral Doppler, color Doppler, and power Doppler as needed of all accessible portions of each vessel. Bilateral testing is considered an integral part of a complete examination. Limited examinations for reoccurring indications may be performed as noted. The reflux portion of the exam is performed with the patient in reverse Trendelenburg.  +-----+---------------+---------+-----------+----------+--------------+  RIGHT Compressibility Phasicity Spontaneity Properties Thrombus Aging  +-----+---------------+---------+-----------+----------+--------------+  CFV   Full            Yes       Yes                                    +-----+---------------+---------+-----------+----------+--------------+   +---------+---------------+---------+-----------+----------+--------------+  LEFT      Compressibility Phasicity Spontaneity Properties Thrombus Aging   +---------+---------------+---------+-----------+----------+--------------+  CFV       None            No        No                     Acute           +---------+---------------+---------+-----------+----------+--------------+  SFJ       None            No        No                     Acute           +---------+---------------+---------+-----------+----------+--------------+  FV Prox   None            No        No                     Acute           +---------+---------------+---------+-----------+----------+--------------+  FV Mid    None            No        No                     Acute           +---------+---------------+---------+-----------+----------+--------------+  FV Distal None            No        No  Acute           +---------+---------------+---------+-----------+----------+--------------+  PFV       None            No        No                     Acute           +---------+---------------+---------+-----------+----------+--------------+  POP       None            No        No                     Acute           +---------+---------------+---------+-----------+----------+--------------+  PTV       Partial         Yes       Yes                    Acute           +---------+---------------+---------+-----------+----------+--------------+  PERO      Partial         Yes       Yes                    Acute           +---------+---------------+---------+-----------+----------+--------------+  Gastroc   None            No        No                     Acute           +---------+---------------+---------+-----------+----------+--------------+  EIV       None            No        No                     Acute           +---------+---------------+---------+-----------+----------+--------------+     Summary: RIGHT: - No evidence of common femoral vein obstruction.  LEFT: - Findings consistent with acute deep vein thrombosis involving the left common femoral vein, SF junction, left femoral  vein, left proximal profunda vein, left popliteal vein, left posterior tibial veins, left peroneal veins, and left gastrocnemius veins.  - Possible obstruction proximal to the inguinal ligament.  - A cystic structure is found in the popliteal fossa.  *See table(s) above for measurements and observations. Electronically signed by Harold Barban MD on 08/25/2021 at 8:53:36 PM.    Final     Labs:  CBC: Recent Labs    08/25/21 3546 08/26/21 0315 08/27/21 0414 08/28/21 0415  WBC 11.5* 9.4 7.5 6.4  HGB 11.4* 10.6* 10.4* 10.0*  HCT 34.4* 32.1* 32.0* 30.8*  PLT 145* 167 195 192    COAGS: Recent Labs    08/27/21 0414  INR 1.1    BMP: Recent Labs    08/15/21 1552 08/24/21 1726 08/25/21 0634 08/27/21 0414  NA 138 131* 132* 137  K 3.3* 4.1 4.2 3.9  CL 100 96* 98 100  CO2 '28 26 26 27  ' GLUCOSE 116* 125* 91 84  BUN '22 20 17 13  ' CALCIUM 8.4* 8.5* 8.6* 8.5*  CREATININE 0.82 0.89 0.75 0.82  GFRNONAA >60 >60 >60 >60    LIVER FUNCTION TESTS: Recent Labs  08/15/21 1552  BILITOT 0.4  AST 18  ALT 9  ALKPHOS 46  PROT 6.0*  ALBUMIN 3.3*    Assessment and Plan:  LLE DVT -1 day s/p angioplasty and stenting -bridging from heparin to eliquis today.   -order for f/u clinic appointment being placed today.  Electronically Signed: Pasty Spillers, PA 08/28/2021, 9:28 AM   I spent a total of 15 Minutes at the the patient's bedside AND on the patient's hospital floor or unit, greater than 50% of which was counseling/coordinating care for DVT

## 2021-08-29 DIAGNOSIS — I82402 Acute embolism and thrombosis of unspecified deep veins of left lower extremity: Secondary | ICD-10-CM

## 2021-08-29 DIAGNOSIS — K5190 Ulcerative colitis, unspecified, without complications: Secondary | ICD-10-CM

## 2021-08-29 LAB — CBC
HCT: 29.6 % — ABNORMAL LOW (ref 36.0–46.0)
Hemoglobin: 9.6 g/dL — ABNORMAL LOW (ref 12.0–15.0)
MCH: 29.7 pg (ref 26.0–34.0)
MCHC: 32.4 g/dL (ref 30.0–36.0)
MCV: 91.6 fL (ref 80.0–100.0)
Platelets: 201 10*3/uL (ref 150–400)
RBC: 3.23 MIL/uL — ABNORMAL LOW (ref 3.87–5.11)
RDW: 14.1 % (ref 11.5–15.5)
WBC: 5.2 10*3/uL (ref 4.0–10.5)
nRBC: 0 % (ref 0.0–0.2)

## 2021-08-29 MED ORDER — APIXABAN 5 MG PO TABS: 5.0000 mg | ORAL_TABLET | Freq: Two times a day (BID) | ORAL | 3 refills | Status: DC

## 2021-08-29 NOTE — Discharge Summary (Signed)
Physician Discharge Summary  RALYN STLAURENT AJO:[id number] DOB: 12/23/1940 DOA: 08/24/2021  PCP: [name], MD  Admit date: 08/24/2021 Discharge date: 08/29/2021  Admitted From: Home Disposition:  Home  Recommendations for Outpatient Follow-up:  Follow up with Hematologist in 1-2 weeks, need to get a lower extremity Doppler in 1 month. Please obtain BMP/CBC in one week   Home Health:No Equipment/Devices:None  Discharge Condition:Stable CODE STATUS:Full Diet recommendation: Heart Healthy  Brief/Interim Summary:  81 y.o. female past medical history significant for hyperlipidemia, history of left lower extremity DVT, not on anticoagulation left-sided ulcerative colitis, history of GI bleed trigeminal neuralgia presents for evaluation of left lower extremity swelling and discoloration, she relates her last flare of ulcerative colitis was several years ago, she has been asymptomatic for some time now. Lower extremity Doppler showed extensive acute lower extremity DVT in the left common and external iliac vein on the left.    Discharge Diagnoses:  Principal Problem:   Acute deep vein thrombosis (DVT) of iliac vein of left lower extremity (HCC) Active Problems:   GERD (gastroesophageal reflux disease)   Left sided ulcerative colitis ([place])   Acute DVT (deep venous thrombosis) (HCC)  Acute lower extremity DVT: Lower extremity Doppler was positive for DVT. Vascular surgery was consulted to perform a thrombectomy with angioplasty and stent placement. Initially she was started on IV heparin then transition to oral Eliquis. She will follow-up with hematology as an outpatient. She will follow-up with IR in 24 weeks.  Left ulcerative colitis: Continue current regimen no changes made no signs of bleeding.  GERD: Continue Protonix.   Discharge Instructions  Discharge Instructions     Diet - low sodium heart healthy   Complete by: As directed    Increase activity slowly   Complete  by: As directed       Allergies as of 08/29/2021       Reactions   Amoxicillin-pot Clavulanate Diarrhea   Codeine Nausea And Vomiting, Other (See Comments)   "drunk feeling and it puts me on the floor"   Simvastatin Other (See Comments)   "Made my legs hurt terribly bad"   Warfarin Other (See Comments)   History of "ulcerative colitis"   Warfarin Sodium Other (See Comments)   History of "ulcerative colitis" and "Blood thinning, so contraindicated"        Medication List     TAKE these medications    apixaban 5 MG Tabs tablet Commonly known as: ELIQUIS Take 1 tablet (5 mg total) by mouth 2 (two) times daily.   cholecalciferol 25 MCG (1000 UNIT) tablet Commonly known as: VITAMIN D3 Take 1,000 Units by mouth daily.   cyanocobalamin 1000 MCG tablet Take 1,000 mcg by mouth daily.   hydrocortisone 2.5 % ointment Apply 1 application topically daily as needed (to affected area).   mesalamine 1.2 g EC tablet Commonly known as: LIALDA Take 4 tablets (4.8 g total) by mouth daily with breakfast. You are overdue for an appointment. Please call to schedule asap. Appointment needed for further refills. What changed:  how much to take when to take this additional instructions   ondansetron 4 MG disintegrating tablet Commonly known as: ZOFRAN-ODT 56m ODT q4 hours prn nausea/vomit What changed:  how much to take how to take this when to take this reasons to take this additional instructions   pantoprazole 20 MG tablet Commonly known as: Protonix Take 1 tablet (20 mg total) by mouth daily. What changed: when to take this  Follow-up Information     Heath Lark, MD Follow up in 1 month(s).   Specialty: Hematology and Oncology Why: Follow-up with hematology in 1 month Contact information: [place] [address] [phone]                Allergies  Allergen Reactions   Amoxicillin-Pot Clavulanate Diarrhea   Codeine  Nausea And Vomiting and Other (See Comments)    "drunk feeling and it puts me on the floor"    Simvastatin Other (See Comments)    "Made my legs hurt terribly bad"   Warfarin Other (See Comments)    History of "ulcerative colitis"   Warfarin Sodium Other (See Comments)    History of "ulcerative colitis" and "Blood thinning, so contraindicated"    Consultations: Interventional radiology   Procedures/Studies: IR THROMBECT SEC [facility] MOD SED  Result Date: 08/28/2021 INDICATION: Extensive LEFT lower extremity DVT. 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 EXAM: Procedures: 1. ULTRASOUND GUIDANCE FOR LEFT POPLITEAL VEIN ACCESS 2. LEFT LOWER EXTREMITY VENOGRAM and INFERIOR VENA CAVAGRAM 3. MECHANICAL THROMBECTOMY, ANGIOPLASTY AND LEFT ILIAC VEIN STENT PLACEMENT 4. INTRAVASCULAR ULTRASOUND (IVUS) COMPARISON:  CT venogram, 08/24/2021 MEDICATIONS: 900 mg clindamycin was administered as preprocedure antibiotics. 4 mg Zofran IV. 5 K units heparin IV. Moderate (conscious) sedation was employed during this procedure. A total of Versed 4 mg and Fentanyl 100 mcg was administered intravenously. CONTRAST:  70m OMNIPAQUE IOHEXOL 300 MG/ML  SOLN ANESTHESIA/SEDATION: Moderate Sedation Time: 122 minutes. The patient's level of consciousness and vital signs were monitored continuously by radiology nursing throughout the procedure under my direct supervision. FLUOROSCOPY TIME:  23 minutes.  0 seconds.  30 mGy COMPLICATIONS: None immediate. TECHNIQUE: Informed written consent was obtained from the patient and/or patient's representative after a discussion of the risks, benefits and alternatives to treatment. Questions regarding the procedure were encouraged and answered. A timeout was performed prior to the initiation of the procedure. The patient was placed supine on the fluoroscopy table and the skin posterior to the left knee was prepped and draped in the usual sterile fashion, and a sterile drape was applied  covering the operative field. Maximum barrier sterile technique with sterile gowns and gloves were used for the procedure. Under direct ultrasound guidance, the LEFT popliteal vein was access with a micropuncture kit after the overlying soft tissues were anesthetized with 1% lidocaine. An ultrasound image was saved for documentation purposed. This allowed for placement of a 5 Fr vascular sheath. A limited venogram was performed through the side arm of the vascular sheath. With the use of a glidewire, a Kumpe catheter was advanced through the LEFT femoral, external iliac vein and through the left common iliac vein to the level of the IVC. Limited venograms were performed in multiple stations. An inferior venacavagram was performed. The Glidewire was exchanged for a Amplatz super stiff wire, the sheath was exchanged for a tapered 11 Fr sheath, then a Penumbra 12 Fr lightning catheter was used to perform serial mechanical thrombectomy from the popliteal vein to the site of iliocaval compression. Follow-up venograms demonstrated adequate clearance then intravascular ultrasound was inserted for venous mapping and sizing for stent placement. Images were saved and will be uploaded PACS. Pre dilatation of the vein was performed using a 12 mm atlas balloon. Over the wire, a 14 x 150 mm Abre venous stent was advanced just past the site of iliocaval compression and deployed using fluoroscopic guidance. Post dilatation for stent seating was performed, then completion venogram was acquired. The  wire and vascular sheath removed. Manual compression at the LEFT knee was performed for hemostasis. A dressing was placed. The patient tolerated the procedure well without immediate postprocedural complication. FINDINGS: Initial ultrasound scanning demonstrates the LEFT popliteal vein is enlarged and filled with echogenic and non compressible thrombus. Extensive LEFT lower extremity DVT, with proximal extension to the site of iliocaval  compression. Post thrombectomy findings of thickened iliac vein on intravascular ultrasound (IVUS), consistent with post thrombotic syndrome (PTS). Technique successful deployment of 14 x 150 mm Abre venous stent across the site of compression, with peripheral extension proximal to the inguinal ligament. Pre-procedure venogram without opacification of pelvic veins secondary to thrombus burden. Postprocedure venogram with widely patent stented LEFT iliac veins, into the IVC. IMPRESSION: Successful LEFT lower extremity venogram, mechanical thrombectomy and iliac vein stent placement for iliocaval compression and extensive DVT, as above. PLAN: 1. Continue heparin gtt and okay to transition to p.o. anticoagulation. 2. Okay to discharge from a vascular interventional radiology (VIR) perspective. 3. The patient will be seen in [facility] for repeat lower extremity venous duplex and follow-up in 1 month. Michaelle Birks, MD [facility] Radiology Electronically Signed   By: Michaelle Birks M.D.   On: 08/28/2021 08:30   IR US Guide Vasc Access Right  Result Date: 08/28/2021 INDICATION: Extensive LEFT lower extremity DVT. 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 EXAM: Procedures: 1. ULTRASOUND GUIDANCE FOR LEFT POPLITEAL VEIN ACCESS 2. LEFT LOWER EXTREMITY VENOGRAM and INFERIOR VENA CAVAGRAM 3. MECHANICAL THROMBECTOMY, ANGIOPLASTY AND LEFT ILIAC VEIN STENT PLACEMENT 4. INTRAVASCULAR ULTRASOUND (IVUS) COMPARISON:  CT venogram, 08/24/2021 MEDICATIONS: 900 mg clindamycin was administered as preprocedure antibiotics. 4 mg Zofran IV. 5 K units heparin IV. Moderate (conscious) sedation was employed during this procedure. A total of Versed 4 mg and Fentanyl 100 mcg was administered intravenously. CONTRAST:  26m OMNIPAQUE IOHEXOL 300 MG/ML  SOLN ANESTHESIA/SEDATION: Moderate Sedation Time: 122 minutes. The patient's level of consciousness and vital signs were monitored continuously  by radiology nursing throughout the procedure under my direct supervision. FLUOROSCOPY TIME:  23 minutes.  0 seconds.  30 mGy COMPLICATIONS: None immediate. TECHNIQUE: Informed written consent was obtained from the patient and/or patient's representative after a discussion of the risks, benefits and alternatives to treatment. Questions regarding the procedure were encouraged and answered. A timeout was performed prior to the initiation of the procedure. The patient was placed supine on the fluoroscopy table and the skin posterior to the left knee was prepped and draped in the usual sterile fashion, and a sterile drape was applied covering the operative field. Maximum barrier sterile technique with sterile gowns and gloves were used for the procedure. Under direct ultrasound guidance, the LEFT popliteal vein was access with a micropuncture kit after the overlying soft tissues were anesthetized with 1% lidocaine. An ultrasound image was saved for documentation purposed. This allowed for placement of a 5 Fr vascular sheath. A limited venogram was performed through the side arm of the vascular sheath. With the use of a glidewire, a Kumpe catheter was advanced through the LEFT femoral, external iliac vein and through the left common iliac vein to the level of the IVC. Limited venograms were performed in multiple stations. An inferior venacavagram was performed. The Glidewire was exchanged for a Amplatz super stiff wire, the sheath was exchanged for a tapered 11 Fr sheath, then a Penumbra 12 Fr lightning catheter was used to perform serial mechanical thrombectomy from the popliteal vein to the site of iliocaval compression. Follow-up  venograms demonstrated adequate clearance then intravascular ultrasound was inserted for venous mapping and sizing for stent placement. Images were saved and will be uploaded PACS. Pre dilatation of the vein was performed using a 12 mm atlas balloon. Over the wire, a 14 x 150 mm Abre venous  stent was advanced just past the site of iliocaval compression and deployed using fluoroscopic guidance. Post dilatation for stent seating was performed, then completion venogram was acquired. The wire and vascular sheath removed. Manual compression at the LEFT knee was performed for hemostasis. A dressing was placed. The patient tolerated the procedure well without immediate postprocedural complication. FINDINGS: Initial ultrasound scanning demonstrates the LEFT popliteal vein is enlarged and filled with echogenic and non compressible thrombus. Extensive LEFT lower extremity DVT, with proximal extension to the site of iliocaval compression. Post thrombectomy findings of thickened iliac vein on intravascular ultrasound (IVUS), consistent with post thrombotic syndrome (PTS). Technique successful deployment of 14 x 150 mm Abre venous stent across the site of compression, with peripheral extension proximal to the inguinal ligament. Pre-procedure venogram without opacification of pelvic veins secondary to thrombus burden. Postprocedure venogram with widely patent stented LEFT iliac veins, into the IVC. IMPRESSION: Successful LEFT lower extremity venogram, mechanical thrombectomy and iliac vein stent placement for iliocaval compression and extensive DVT, as above. PLAN: 1. Continue heparin gtt and okay to transition to p.o. anticoagulation. 2. Okay to discharge from a vascular interventional radiology (VIR) perspective. 3. The patient will be seen in [facility] for repeat lower extremity venous duplex and follow-up in 1 month. Michaelle Birks, MD [facility] Radiology Electronically Signed   By: Michaelle Birks M.D.   On: 08/28/2021 08:30   VAS [place] IVC/ILIAC (VENOUS ONLY)  Result Date: 08/25/2021 IVC/ILIAC STUDY Patient Name:  KENDRE SIRES  Date of Exam:   08/24/2021 Medical Rec #: [id number]        Accession #:    [id number] Date of Birth: Oct 09, 1940        Patient Gender: F  Patient Age:   31 years Exam Location:  [facility] Procedure:      VAS [place] IVC/ILIAC (VENOUS ONLY) Referring Phys: Wille Glaser KNAPP --------------------------------------------------------------------------------  Indications: Extensive acute DVT of the left lower extremity with extension              above the inguinal ligament Limitations: Air/bowel gas.  Comparison Study: 08-24-2021 Left lower extremity venous duplex study Performing Technologist: Darlin Coco RDMS, RVT  Examination Guidelines: A complete evaluation includes B-mode imaging, spectral Doppler, color Doppler, and power Doppler as needed of all accessible portions of each vessel. Bilateral testing is considered an integral part of a complete examination. Limited examinations for reoccurring indications may be performed as noted.  IVC/Iliac Findings: +----------+------+--------+--------+     IVC     Patent Thrombus Comments  +----------+------+--------+--------+  IVC Prox   patent                    +----------+------+--------+--------+  IVC Mid    patent                    +----------+------+--------+--------+  IVC Distal patent                    +----------+------+--------+--------+  +-------------------+---------+-----------+---------+-----------+--------+          CIV         RT-Patent RT-Thrombus LT-Patent LT-Thrombus Comments  +-------------------+---------+-----------+---------+-----------+--------+  Common Iliac Prox  acute              +-------------------+---------+-----------+---------+-----------+--------+  Common Iliac Mid                                       acute              +-------------------+---------+-----------+---------+-----------+--------+  Common Iliac Distal                                    acute              +-------------------+---------+-----------+---------+-----------+--------+  +-------------------------+---------+-----------+---------+-----------+--------+             EIV             RT-Patent RT-Thrombus LT-Patent LT-Thrombus Comments  +-------------------------+---------+-----------+---------+-----------+--------+  External Iliac Vein Prox                                     acute              +-------------------------+---------+-----------+---------+-----------+--------+  External Iliac Vein Mid                                      acute              +-------------------------+---------+-----------+---------+-----------+--------+  External Iliac Vein                                          acute               Distal                                                                          +-------------------------+---------+-----------+---------+-----------+--------+   Summary: IVC/Iliac: There is no evidence of thrombus involving the IVC. There is evidence of acute thrombus involving the left common iliac vein. There is evidence of acute thrombus involving the left external iliac vein. Visualized segments of the right iliac veins appear patent.  *See table(s) above for measurements and observations.  Electronically signed by Harold Barban MD on 08/25/2021 at 8:53:21 PM.    Final    DG Chest Port 1 View  Result Date: 08/15/2021 CLINICAL DATA:  Short of breath. Diagnosed with COVID-19 1 week ago. EXAM: PORTABLE CHEST 1 VIEW COMPARISON:  08/07/2021. FINDINGS: Cardiac silhouette is normal in size and configuration. Normal mediastinal and hilar contours. Clear lungs.  No pleural effusion or pneumothorax. Skeletal structures are grossly intact IMPRESSION: No active disease. Electronically Signed   By: Lajean Manes M.D.   On: 08/15/2021 16:39   DG Chest Portable 1 View  Result Date: 08/07/2021 CLINICAL DATA:  Cough. EXAM: PORTABLE CHEST 1 VIEW COMPARISON:  None. FINDINGS: 1627 hours. The lungs are clear without focal pneumonia, edema, pneumothorax or pleural effusion. The cardiopericardial silhouette is within normal limits  for size. The visualized bony structures of the thorax show  no acute abnormality. IMPRESSION: No active disease. Electronically Signed   By: Misty Stanley M.D.   On: 08/07/2021 16:41   CT VENOGRAM ABD/PEL  Result Date: 08/24/2021 CLINICAL DATA:  History of blood clots, left foot discoloration, deep venous thrombosis EXAM: CT VENOGRAM ABDOMEN AND PELVIS TECHNIQUE: Multidetector CT imaging of the abdomen was performed using the standard protocol following bolus administration of intravenous contrast, with imaging performed during the portal venous and delayed venous phases of contrast enhancement. RADIATION DOSE REDUCTION: This exam was performed according to the departmental dose-optimization program which includes automated exposure control, adjustment of the mA and/or kV according to patient size and/or use of iterative reconstruction technique. CONTRAST:  161m OMNIPAQUE IOHEXOL 350 MG/ML SOLN COMPARISON:  None. FINDINGS: Lower chest: No acute pleural or parenchymal lung disease. Small hiatal hernia. Hepatobiliary: No focal liver abnormality is seen. No gallstones, gallbladder wall thickening, or biliary dilatation. Pancreas: Unremarkable. No pancreatic ductal dilatation or surrounding inflammatory changes. Spleen: Normal in size without focal abnormality. Adrenals/Urinary Tract: There are bilateral peripelvic renal cysts. Kidneys enhance normally and symmetrically. No urinary tract calculi or obstruction. Bladder is unremarkable. Adrenals are normal. Stomach/Bowel: No bowel obstruction or ileus. No bowel wall thickening or inflammatory change. Vascular/Lymphatic: There is enlargement and occlusive thrombus involving the left left common iliac, external iliac, common femoral, and visualized portions of the superficial and profundus femoral veins, consistent with acute DVT. Diffuse atherosclerosis of the aorta. No pathologic adenopathy of the abdomen or pelvis. Reproductive: Uterus and bilateral adnexa are unremarkable. Other: No free fluid or free gas.  No abdominal  wall hernia. Musculoskeletal: There is diffuse subcutaneous edema within the visualized left lower extremity consistent with acute DVT. No acute or destructive bony lesions. 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 Reconstructed images demonstrate no additional findings. IMPRESSION: 1. Acute occlusive deep venous thrombosis involving the left common iliac, external iliac, common femoral, femoral, and profundus femoral veins as above. 2. Subcutaneous edema throughout the left lower extremity. 3. Small hiatal hernia. 4.  Aortic Atherosclerosis (ICD10-I70.0). Critical Value/emergent results were called by telephone at the time of interpretation on 08/24/2021 at 10:38 pm [facility], who verbally acknowledged these results. Electronically Signed   By: MRanda NgoM.D.   On: 08/24/2021 22:39   VAS [place]OWER EXTREMITY VENOUS (DVT) (7a-7p)  Result Date: 08/25/2021  Lower Venous DVT Study Patient Name:  ACYNIA ABRUZZO Date of Exam:   08/24/2021 Medical Rec #: [id number]       Accession #:    [id number] of Birth: [birth year]       Patient Gender: F Patient Age:   89years [facility]:      V[facility] [place] EXTREMITY VENOUS (DVT) Referring Phys: CCharmaine Downs--------------------------------------------------------------------------------  Indications: History of DVT of the left lower extremity, recent Covid-19 (08/10/21), pain, swelling, and discoloration to left lower extremity.  Comparison Study: No prior studies available. Performing Technologist: RDarlin CocoRDMS, RVT  Examination Guidelines: A complete evaluation includes B-mode imaging, spectral Doppler, color Doppler, and power Doppler as needed of all accessible portions of each vessel. Bilateral testing is considered an integral part of a complete examination. Limited examinations for reoccurring indications may be performed as noted. The reflux portion of the exam is performed  with the patient in reverse Trendelenburg.  +-----+---------------+---------+-----------+----------+--------------+  RIGHT Compressibility Phasicity Spontaneity  Properties Thrombus Aging  +-----+---------------+---------+-----------+----------+--------------+  CFV   Full            Yes       Yes                                    +-----+---------------+---------+-----------+----------+--------------+   +---------+---------------+---------+-----------+----------+--------------+  LEFT      Compressibility Phasicity Spontaneity Properties Thrombus Aging  +---------+---------------+---------+-----------+----------+--------------+  CFV       None            No        No                     Acute           +---------+---------------+---------+-----------+----------+--------------+  SFJ       None            No        No                     Acute           +---------+---------------+---------+-----------+----------+--------------+  FV Prox   None            No        No                     Acute           +---------+---------------+---------+-----------+----------+--------------+  FV Mid    None            No        No                     Acute           +---------+---------------+---------+-----------+----------+--------------+  FV Distal None            No        No                     Acute           +---------+---------------+---------+-----------+----------+--------------+  PFV       None            No        No                     Acute           +---------+---------------+---------+-----------+----------+--------------+  POP       None            No        No                     Acute           +---------+---------------+---------+-----------+----------+--------------+  PTV       Partial         Yes       Yes                    Acute           +---------+---------------+---------+-----------+----------+--------------+  PERO      Partial         Yes       Yes                    Acute            +---------+---------------+---------+-----------+----------+--------------+  Gastroc   None            No        No                     Acute           +---------+---------------+---------+-----------+----------+--------------+  EIV       None            No        No                     Acute           +---------+---------------+---------+-----------+----------+--------------+     Summary: RIGHT: - No evidence of common femoral vein obstruction.  LEFT: - Findings consistent with acute deep vein thrombosis involving the left common femoral vein, SF junction, left femoral vein, left proximal profunda vein, left popliteal vein, left posterior tibial veins, left peroneal veins, and left gastrocnemius veins.  - Possible obstruction proximal to the inguinal ligament.  - A cystic structure is found in the popliteal fossa.  *See table(s) above for measurements and observations. Electronically signed by Harold Barban MD on 08/25/2021 at 8:53:36 PM.    Final    (Echo, Carotid, EGD, Colonoscopy, ERCP)    Subjective: No complaints  Discharge Exam: Vitals:   08/28/21 1200 08/28/21 2020  BP: (!) 104/59 (!) 114/42  Pulse: 78 79  Resp: 18 16  Temp: (!) 97.4 F (36.3 C) 99.1 F (37.3 C)  SpO2: 95% 96%   Vitals:   08/28/21 0616 08/28/21 0800 08/28/21 1200 08/28/21 2020  BP: (!) 109/44 (!) 106/54 (!) 104/59 (!) 114/42  Pulse: 70 71 78 79  Resp: _0 Temp: 98.3 F (36.8 C) 97.7 F (36.5 C) (!) 97.4 F (36.3 C) 99.1 F (37.3 C)  TempSrc: Oral Oral Oral Oral  SpO2: 97% 98% 95% 96%  Weight:      Height:        General: Pt is alert, awake, not in acute distress Cardiovascular: RRR, S1/S2 +, no rubs, no gallops Respiratory: CTA bilaterally, no wheezing, no rhonchi Abdominal: Soft, NT, ND, bowel sounds + Extremities: no edema, no cyanosis    The results of significant diagnostics from this hospitalization (including imaging, microbiology, ancillary and laboratory) are listed below for  reference.     Microbiology: Recent Results (from the past 240 hour(s))  Resp Panel by RT-PCR (Flu A&B, Covid) Nasopharyngeal Swab     Status: None   Collection Time: 08/24/21  8:55 PM   Specimen: Nasopharyngeal Swab; Nasopharyngeal(NP) swabs in vial transport medium  Result Value Ref Range Status   SARS Coronavirus 2 by RT PCR NEGATIVE NEGATIVE Final    Comment: (NOTE) SARS-CoV-2 target nucleic acids are NOT DETECTED.  The SARS-CoV-2 RNA is generally detectable in upper respiratory specimens during the acute phase of infection. The lowest concentration of SARS-CoV-2 viral copies this assay can detect is 138 copies/mL. A negative result does not preclude SARS-Cov-2 infection and should not be used as the sole basis for treatment or other patient management decisions. A negative result may occur with  improper specimen collection/handling, submission of specimen other than nasopharyngeal swab, presence of viral mutation(s) within the areas targeted by this assay, and inadequate number of viral copies(<138 copies/mL). A negative result must be combined with clinical observations, patient history, and epidemiological information. The expected result is Negative.  Fact Sheet for Patients:  EntrepreneurPulse.com.au  Fact  Sheet for Healthcare Providers:  IncredibleEmployment.be  This test is no t yet approved or cleared by the [place] FDA and  has been authorized for detection and/or diagnosis of SARS-CoV-2 by FDA under an Emergency Use Authorization (EUA). This EUA will remain  in effect (meaning this test can be used) for the duration of the COVID-19 declaration under Section 564(b)(1) of the Act, 21 U.S.C.section 360bbb-3(b)(1), unless the authorization is terminated  or revoked sooner.       Influenza A by PCR NEGATIVE NEGATIVE Final   Influenza B by PCR NEGATIVE NEGATIVE Final    Comment: (NOTE) The Xpert Xpress SARS-CoV-2/FLU/RSV  plus assay is intended as an aid in the diagnosis of influenza from Nasopharyngeal swab specimens and should not be used as a sole basis for treatment. Nasal washings and aspirates are unacceptable for Xpert Xpress SARS-CoV-2/FLU/RSV testing.  Fact Sheet for Patients: EntrepreneurPulse.com.au  Fact Sheet for Healthcare Providers: IncredibleEmployment.be  This test is not yet approved or cleared by the [place] FDA and has been authorized for detection and/or diagnosis of SARS-CoV-2 by FDA under an Emergency Use Authorization (EUA). This EUA will remain in effect (meaning this test can be used) for the duration of the COVID-19 declaration under Section 564(b)(1) of the Act, 21 U.S.C. section 360bbb-3(b)(1), unless the authorization is terminated or revoked.  Performed at [facility], [place] [address] [place], [place] [address]      Labs: BNP (last 3 results) No results for input(s): BNP in the last 8760 hours. Basic Metabolic Panel: Recent Labs  Lab 08/24/21 1726 08/25/21 0634 08/27/21 0414  NA 131* 132* 137  K 4.1 4.2 3.9  CL 96* 98 100  CO2 _0 GLUCOSE 125* 91 84  BUN _1 CREATININE 0.89 0.75 0.82  CALCIUM 8.5* 8.6* 8.5*   Liver Function Tests: No results for input(s): AST, ALT, ALKPHOS, BILITOT, PROT, ALBUMIN in the last 168 hours. No results for input(s): LIPASE, AMYLASE in the last 168 hours. No results for input(s): AMMONIA in the last 168 hours. CBC: Recent Labs  Lab 08/24/21 1726 08/25/21 0634 08/26/21 0315 08/27/21 0414 08/28/21 0415 08/29/21 0416  WBC 15.5* 11.5* 9.4 7.5 6.4 5.2  NEUTROABS 12.3*  --   --   --   --   --   HGB 12.0 11.4* 10.6* 10.4* 10.0* 9.6*  HCT 36.4 34.4* 32.1* 32.0* 30.8* 29.6*  MCV 90.3 89.8 90.9 90.9 92.2 91.6  PLT 154 145* 167 195 192 201   Cardiac Enzymes: No results for input(s): CKTOTAL, CKMB, CKMBINDEX, TROPONINI in the last 168  hours. BNP: Invalid input(s): POCBNP CBG: No results for input(s): GLUCAP in the last 168 hours. D-Dimer No results for input(s): DDIMER in the last 72 hours. Hgb A1c No results for input(s): HGBA1C in the last 72 hours. Lipid Profile No results for input(s): CHOL, HDL, LDLCALC, TRIG, CHOLHDL, LDLDIRECT in the last 72 hours. Thyroid function studies No results for input(s): TSH, T4TOTAL, T3FREE, THYROIDAB in the last 72 hours.  Invalid input(s): FREET3 Anemia work up No results for input(s): VITAMINB12, FOLATE, FERRITIN, TIBC, IRON, RETICCTPCT in the last 72 hours. Urinalysis No results found for: COLORURINE, APPEARANCEUR, LABSPEC, [place], GLUCOSEU, [place], [place], KETONESUR, PROTEINUR, UROBILINOGEN, NITRITE, LEUKOCYTESUR Sepsis Labs Invalid input(s): PROCALCITONIN,  WBC,  LACTICIDVEN Microbiology Recent Results (from the past 240 hour(s))  Resp Panel by RT-PCR (Flu A&B, Covid) Nasopharyngeal Swab     Status: None   Collection Time: 08/24/21  8:55  PM   Specimen: Nasopharyngeal Swab; Nasopharyngeal(NP) swabs in vial transport medium  Result Value Ref Range Status   SARS Coronavirus 2 by RT PCR NEGATIVE NEGATIVE Final    Comment: (NOTE) SARS-CoV-2 target nucleic acids are NOT DETECTED.  The SARS-CoV-2 RNA is generally detectable in upper respiratory specimens during the acute phase of infection. The lowest concentration of SARS-CoV-2 viral copies this assay can detect is 138 copies/mL. A negative result does not preclude SARS-Cov-2 infection and should not be used as the sole basis for treatment or other patient management decisions. A negative result may occur with  improper specimen collection/handling, submission of specimen other than nasopharyngeal swab, presence of viral mutation(s) within the areas targeted by this assay, and inadequate number of viral copies(<138 copies/mL). A negative result must be combined with clinical observations, patient history, and  epidemiological information. The expected result is Negative.  Fact Sheet for Patients:  EntrepreneurPulse.com.au  Fact Sheet for Healthcare Providers:  IncredibleEmployment.be  This test is no t yet approved or cleared by the [place] FDA and  has been authorized for detection and/or diagnosis of SARS-CoV-2 by FDA under an Emergency Use Authorization (EUA). This EUA will remain  in effect (meaning this test can be used) for the duration of the COVID-19 declaration under Section 564(b)(1) of the Act, 21 U.S.C.section 360bbb-3(b)(1), unless the authorization is terminated  or revoked sooner.       Influenza A by PCR NEGATIVE NEGATIVE Final   Influenza B by PCR NEGATIVE NEGATIVE Final    Comment: (NOTE) The Xpert Xpress SARS-CoV-2/FLU/RSV plus assay is intended as an aid in the diagnosis of influenza from Nasopharyngeal swab specimens and should not be used as a sole basis for treatment. Nasal washings and aspirates are unacceptable for Xpert Xpress SARS-CoV-2/FLU/RSV testing.  Fact Sheet for Patients: EntrepreneurPulse.com.au  Fact Sheet for Healthcare Providers: IncredibleEmployment.be  This test is not yet approved or cleared by the [place] FDA and has been authorized for detection and/or diagnosis of SARS-CoV-2 by FDA under an Emergency Use Authorization (EUA). This EUA will remain in effect (meaning this test can be used) for the duration of the COVID-19 declaration under Section 564(b)(1) of the Act, 21 U.S.C. section 360bbb-3(b)(1), unless the authorization is terminated or revoked.  Performed at [facility], [place] [address], [place], [place] [address]      Time coordinating discharge: Over 30 minutes  SIGNED:   Charlynne Cousins, MD  [facility] 08/29/2021, 8:20 AM Pager   If 7PM-7AM, please contact night-coverage [email or web address] Password [email or web address]

## 2021-08-29 NOTE — Evaluation (Signed)
Physical Therapy Evaluation Patient Details Name: Ashley Carrillo MRN: [id number] DOB: Mar 24, 1941 Today's Date: 08/29/2021  History of Present Illness  Pt admitted from home with L LE edema and dx with acute DVT  of iliac vein.  Pt now sp thrombectomy, angioplasty, and stent placement 08/27/21.  Pt with hx of carotid artery stenosis and 2 prior L LE DVT.  Pt also with COVID 1/23  Clinical Impression  Pt admitted as above and presenting with functional mobility limitations 2* generalized weakness, poor endurance, balance deficits and c/o dizziness with standing/ambulating - BP sitting 125/57; standing 126/56; pt states unable to stand 3 min for final test and returned to sitting.  Pt reports dizziness has been an issue since COVID last month but she is noticing slow improvement.  Pt should progress to dc home with assist of family and part-time PCA and would benefit from follow up HHPT to further address deficits including strength, endurance, and balance.     Recommendations for follow up therapy are one component of a multi-disciplinary discharge planning process, led by the attending physician.  Recommendations may be updated based on patient status, additional functional criteria and insurance authorization.  Follow Up Recommendations Home health PT    Assistance Recommended at Discharge Frequent or constant Supervision/Assistance  Patient can return home with the following  A little help with walking and/or transfers;A little help with bathing/dressing/bathroom;Assistance with cooking/housework;Assist for transportation;Help with stairs or ramp for entrance    Equipment Recommendations None recommended by PT  Recommendations for Other Services  OT consult    Functional Status Assessment Patient has had a recent decline in their functional status and demonstrates the ability to make significant improvements in function in a reasonable and predictable amount of time.     Precautions /  Restrictions Precautions Precautions: Fall Restrictions Weight Bearing Restrictions: No      Mobility  Bed Mobility Overal bed mobility: Modified Independent             General bed mobility comments: No physical assist    Transfers Overall transfer level: Needs assistance Equipment used: Rolling walker (2 wheels), None Transfers: Sit to/from Stand Sit to Stand: Min guard, Supervision           General transfer comment: min guard to steady on first attempt sans AD; sup with cues on 2nd attempt with RW    Ambulation/Gait Ambulation/Gait assistance: Min assist, Min guard Gait Distance (Feet): 60 Feet Assistive device: Rolling walker (2 wheels), 1 person hand held assist Gait Pattern/deviations: Step-to pattern, Step-through pattern, Decreased step length - right, Decreased step length - left, Shuffle, Trunk flexed Gait velocity: decr     General Gait Details: Unsteady and with c/o LE weakness sans AD; noted improvement in stabiltiy and activity tolerance with introduction of RW  Stairs            Wheelchair Mobility    Modified Rankin (Stroke Patients Only)       Balance Overall balance assessment: Needs assistance Sitting-balance support: No upper extremity supported, Feet supported Sitting balance-Leahy Scale: Good     Standing balance support: Single extremity supported Standing balance-Leahy Scale: Poor                               Pertinent Vitals/Pain Pain Assessment Pain Assessment: No/denies pain    Home Living Family/patient expects to be discharged to:: Private residence Living Arrangements: Spouse/significant other Available Help at Discharge: Family;Personal  care attendant Type of Home: House       Alternate Level Stairs-Number of Steps: has chair lift Home Layout: Two level Home Equipment: Conservation officer, nature (2 wheels) Additional Comments: Pt reports son will be with until TUesday and PCA has been hired for 3  hrs/day    Prior Function Prior Level of Function : Independent/Modified Independent             Mobility Comments: no difficulties prior to very recent COVID       Hand Dominance   Dominant Hand: Right    Extremity/Trunk Assessment   Upper Extremity Assessment Upper Extremity Assessment: Overall WFL for tasks assessed    Lower Extremity Assessment Lower Extremity Assessment: Generalized weakness;LLE deficits/detail RLE Deficits / Details: AROM WFL with 4/5 strength LLE Deficits / Details: AROM WFL and strength min of 3/5 - pt requests MMT deferred with L LE tender to touch LLE: Unable to fully assess due to pain    Cervical / Trunk Assessment Cervical / Trunk Assessment: Normal  Communication   Communication: No difficulties  Cognition Arousal/Alertness: Awake/alert Behavior During Therapy: WFL for tasks assessed/performed Overall Cognitive Status: Within Functional Limits for tasks assessed                                          General Comments      Exercises     Assessment/Plan    PT Assessment Patient needs continued PT services  PT Problem List Decreased strength;Decreased activity tolerance;Decreased balance;Decreased mobility;Decreased knowledge of use of DME       PT Treatment Interventions DME instruction;Gait training;Stair training;Functional mobility training;Therapeutic activities;Therapeutic exercise;Patient/family education;Balance training    PT Goals (Current goals can be found in the Care Plan section)  Acute Rehab PT Goals Patient Stated Goal: Regain IND PT Goal Formulation: With patient Time For Goal Achievement: 09/12/21 Potential to Achieve Goals: Good    Frequency Min 3X/week     Co-evaluation               AM-PAC PT "6 Clicks" Mobility  Outcome Measure Help needed turning from your back to your side while in a flat bed without using bedrails?: None Help needed moving from lying on your back to  sitting on the side of a flat bed without using bedrails?: None Help needed moving to and from a bed to a chair (including a wheelchair)?: A Little Help needed standing up from a chair using your arms (e.g., wheelchair or bedside chair)?: A Little Help needed to walk in hospital room?: A Little Help needed climbing 3-5 steps with a railing? : A Lot 6 Click Score: 19    End of Session Equipment Utilized During Treatment: Gait belt Activity Tolerance: Patient tolerated treatment well;Patient limited by fatigue Patient left: in chair;with call bell/phone within reach Nurse Communication: Mobility status PT Visit Diagnosis: Unsteadiness on feet (R26.81);Muscle weakness (generalized) (M62.81);Difficulty in walking, not elsewhere classified (R26.2)    Time: 1696-7893 PT Time Calculation (min) (ACUTE ONLY): 34 min   Charges:   PT Evaluation $PT Eval Low Complexity: 1 Low PT Treatments $Gait Training: 8-22 mins        Debe Coder PT Acute Rehabilitation Services Pager [phone] Office [phone]   Tamiah Dysart 08/29/2021, 9:56 AM

## 2021-08-29 NOTE — TOC Progression Note (Signed)
Transition of Care [facility]) - Progression Note    Patient Details  Name: JOZI MALACHI MRN: [id number] Date of Birth: December 17, 1940  [facility]) CM/SW Contact  Ludwig Clarks, [place] Phone Number: 08/29/2021, 2:24 PM  Clinical Narrative:     Pt for dc home today with spouse. Pt had already left the hospital so CSW called pt and spoke with her spouse. Pt's husband is agreeable to HHPT and to [facility] (can staff it in <48hours). Per husband he also has plans to meet with [facility] for private duty caregiver support. CSW explained to husband the difference between the 2 agencies.  No other needs identified.      Expected Discharge Plan: [place] Barriers to Discharge: No Barriers Identified  Expected Discharge Plan and Services Expected Discharge Plan: [place] In-house Referral: Clinical Social Work   Post Acute Care Choice: [place] arrangements for the past 2 months: [place] Expected Discharge Date: 08/29/21                         HH Arranged: PT [place]: [place] Date [place]: 08/29/21 Time [place]: 1749 Representative spoke with at [place]: [place] ([place]) Interventions    Readmission Risk Interventions No flowsheet data found.

## 2021-08-31 ENCOUNTER — Telehealth (HOSPITAL_COMMUNITY): Payer: Self-pay | Admitting: Interventional Radiology

## 2021-08-31 LAB — SURGICAL PATHOLOGY

## 2021-08-31 NOTE — Progress Notes (Signed)
Vascular and Interventional Radiology  Phone Note  Patient: Ashley Carrillo DOB: [birth year] Medical Record Number: [id number] Note Date/Time: 08/31/21 1:54 PM   Admitting Diagnosis: LLE iliocaval DVT  No diagnosis found.   I identified myself to the patient and conveyed my credentials to Ashley Carrillo  Assessment   Plan: 81 y.o. year old female s/p thrombectomy and iliac vein stent placement for extensive LLE DVT on 08/27/21. VIR MD reached out in courtesy follow up.  Pt was discharged on POD #2 without event. She reports that she she's doing well. Decreasing LLE swelling. No pain or discomfort. Still "tired" from recent COVID and s/p Paxlovid. Dizzy at times. OOB and ambulate as tolerated. On PO AC w Eliquis. Will follow up with Hematology in approx 2 wks. Will follow up with me in Clinic in approx 4 wks and with LLE venous duplex.   Follow up Pt to follow up with me in Clinic within a month post op.  As part of this Telephone encounter, no in-person exam was conducted.  The patient was physically located in [place] or a state in which I am permitted to provide care. The encounter was reasonable and appropriate under the circumstances given the patient's presentation at the time.  The patient and/or parent/guardian has been advised of the potential risks and limitations of this mode of treatment (including, but not limited to, the absence of in-person examination) and has agreed to be treated using telemedicine. The patient's/patient's family's questions regarding their request have been answered and/or has also been advised to contact their providers office for worsening conditions, and seek emergency medical treatment and/or call 911 if the patient deems either necessary.   Michaelle Birks, MD [facility]   Pager. [place]

## 2021-09-01 ENCOUNTER — Telehealth: Payer: Self-pay | Admitting: Gastroenterology

## 2021-09-01 NOTE — Telephone Encounter (Signed)
Inbound call from patient requesting to speak with a nurse please in regards to some medications she is taking.

## 2021-09-01 NOTE — Telephone Encounter (Signed)
Returned call to patient. She states that she has been in the hospital for a week because she had COVID and had a blood clot that went from her ankle to her stomach. She states that the clot was treated by interventional radiology. Pt states that she has not been taking her meds as seh should since she has been in the hospital. She states that she needs to get her colitis under control, she is having diarrhea. I told pt that she will need to resume [place] and can take Imodium as needed to help slow down the diarrhea. I told pt that she needs to make sure that she is staying hydrated with fluids. Pt did not want to scheduled a follow up at this time because she states that she needs to recover first. Pt will call us back to schedule a f/u appt. Pt verbalized understanding and had no concerns at the end of the call.

## 2021-09-02 ENCOUNTER — Other Ambulatory Visit: Payer: Self-pay | Admitting: Interventional Radiology

## 2021-09-02 DIAGNOSIS — I82402 Acute embolism and thrombosis of unspecified deep veins of left lower extremity: Secondary | ICD-10-CM

## 2021-09-07 ENCOUNTER — Telehealth: Payer: Self-pay | Admitting: Hematology and Oncology

## 2021-09-07 NOTE — Telephone Encounter (Signed)
Scheduled appt per per 2/20 staff msg from Dr. Alvy Bimler. I did offer pt an earlier date, however pt requested to sch appt for the end of March. Pt is aware of appt date and time. Pt is aware to arrive 15 mins prior to appt time and to bring and updated insurance card. Pt is aware of appt location.

## 2021-10-05 ENCOUNTER — Other Ambulatory Visit: Payer: Self-pay

## 2021-10-05 ENCOUNTER — Ambulatory Visit
Admission: RE | Admit: 2021-10-05 | Discharge: 2021-10-05 | Disposition: A | Discharge status: Home / Self Care | Payer: Medicare HMO | Source: Ambulatory Visit | Attending: Physician Assistant | Admitting: Physician Assistant

## 2021-10-05 ENCOUNTER — Ambulatory Visit
Admission: RE | Admit: 2021-10-05 | Discharge: 2021-10-05 | Disposition: A | Discharge status: Home / Self Care | Payer: Medicare HMO | Source: Ambulatory Visit | Attending: Interventional Radiology | Admitting: Interventional Radiology

## 2021-10-05 DIAGNOSIS — I82402 Acute embolism and thrombosis of unspecified deep veins of left lower extremity: Secondary | ICD-10-CM

## 2021-10-05 NOTE — Progress Notes (Signed)
? ?Referring Physician(s): ?Boisseau,Hayley IR PA ? ?Chief Complaint: ?The patient is seen in follow up today s/p recent treatment for extensive LLE DVT ? ?History of present illness: ? ?81 y/o F w PMHx significant for UC, trigeminal neuralgia s/p GK, and prior LLE DVTs who presented to [facility] ER w extensive LLE DVT after acute COVID-19 infection in 08/24/21. ER MD (Dr. Dorie Rank) consulted VIR evaluation and I became involved in her care. 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 ? ?She was discharged on POD#2 on Eliquis, without event though was reportedly still fatigued from [place] even after Paxlovid. She returns to Clinic today for follow up and repeat LLE venous doppler. She also sees Dr. Alvy Bimler (Heme/Onc) in a week for [facility] follow up. She reports feeling much better overall. ? ?Review of Systems: A 12 point ROS discussed and pertinent positives are indicated in the HPI above.  All other systems are negative. ? ?Past Medical History:  ?Diagnosis Date  ? Arthritis   ? Blood clot in vein   ? in groin  ? Carotid artery stenosis, asymptomatic   ? GERD (gastroesophageal reflux disease)   ? on meds  ? Hyperlipidemia   ? Trigeminal neuralgia   ? Ulcerative colitis   ? ? ?Past Surgical History:  ?Procedure Laterality Date  ? BALLOON DILATION  07/03/2012  ? Procedure: BALLOON DILATION;  Surgeon: Garlan Fair, MD;  Location: [facility];  Service: Endoscopy;  Laterality: N/A;  ? BALLOON DILATION N/A 11/12/2013  ? Procedure: BALLOON DILATION;  Surgeon: Garlan Fair, MD;  Location: [facility];  Service: Endoscopy;  Laterality: N/A;  ? BREAST BIOPSY  11/01/2011  ? Procedure: BREAST BIOPSY WITH NEEDLE LOCALIZATION;  Surgeon: Adin Hector, MD;  Location: [facility];  Service: General;  Laterality: Right;  ? BREAST LUMPECTOMY  1970  ? right  breast- benign  ? COLONOSCOPY WITH PROPOFOL N/A 09/21/2015  ? Procedure: COLONOSCOPY WITH PROPOFOL;  Surgeon: Garlan Fair, MD;  Location: [facility];  Service: Endoscopy;  Laterality: N/A;  ? CYSTECTOMY    ? finger  ? ESOPHAGOGASTRODUODENOSCOPY N/A 11/12/2013  ? Procedure: ESOPHAGOGASTRODUODENOSCOPY (EGD);  Surgeon: Garlan Fair, MD;  Location: [facility];  Service: Endoscopy;  Laterality: N/A;  ? ESOPHAGOGASTRODUODENOSCOPY (EGD) WITH ESOPHAGEAL DILATION  07/03/2012  ? Procedure: ESOPHAGOGASTRODUODENOSCOPY (EGD) WITH ESOPHAGEAL DILATION;  Surgeon: Garlan Fair, MD;  Location: [facility];  Service: Endoscopy;  Laterality: N/A;  ? ESOPHAGOGASTRODUODENOSCOPY (EGD) WITH PROPOFOL N/A 09/21/2015  ? Procedure: ESOPHAGOGASTRODUODENOSCOPY (EGD) WITH PROPOFOL;  Surgeon: Garlan Fair, MD;  Location: [facility];  Service: Endoscopy;  Laterality: N/A;  ? IR THROMBECT SEC MECH MOD SED  08/27/2021  ? IR US GUIDE VASC ACCESS RIGHT  08/27/2021  ? ? ?Allergies: ?Amoxicillin-pot clavulanate, Codeine, Simvastatin, Warfarin, and Warfarin sodium ? ?Medications: ?Prior to Admission medications   ?Medication Sig Start Date End Date Taking? Authorizing Provider  ?apixaban (ELIQUIS) 5 MG TABS tablet Take 1 tablet (5 mg total) by mouth 2 (two) times daily. 08/29/21   Charlynne Cousins, MD  ?cholecalciferol (VITAMIN D3) 25 MCG (1000 UNIT) tablet Take 1,000 Units by mouth daily.    [provider]  ?cyanocobalamin 1000 MCG tablet Take 1,000 mcg by mouth daily.    [provider]  ?hydrocortisone 2.5 % ointment Apply 1 application topically daily as needed (to affected  area).    [provider]  ?mesalamine (LIALDA) 1.2 g EC tablet Take 4 tablets (4.8 g total) by mouth daily with breakfast. You are overdue for an appointment. Please call to schedule asap. Appointment needed for further refills. ?Patient taking differently: Take 2.4 g by mouth 2 (two) times daily with a meal. 07/28/21   Armbruster,  Carlota Raspberry, MD  ?ondansetron (ZOFRAN-ODT) 4 MG disintegrating tablet 50m ODT q4 hours prn nausea/vomit ?Patient taking differently: Take 4 mg by mouth every 4 (four) hours as needed for nausea or vomiting (dissolve orally). 08/15/21   YDrenda Freeze MD  ?pantoprazole (PROTONIX) 20 MG tablet Take 1 tablet (20 mg total) by mouth daily. ?Patient taking differently: Take 20 mg by mouth daily before breakfast. 02/10/21   Armbruster, SCarlota Raspberry MD  ?  ? ?Family History  ?Problem Relation Age of Onset  ? Heart disease Mother   ? Prostate cancer Brother   ? Diabetes Brother   ? Other Brother   ?     unknown cause age 82 ? Colon cancer Neg Hx   ? Stomach cancer Neg Hx   ? Colon polyps Neg Hx   ? Esophageal cancer Neg Hx   ? Rectal cancer Neg Hx   ? ? ?Social History  ? ?Socioeconomic History  ? Marital status: Married  ?  Spouse name: Not on file  ? Number of children: 1  ? Years of education: Not on file  ? Highest education level: Not on file  ?Occupational History  ? Occupation: retired  ?Tobacco Use  ? Smoking status: Former  ?  Packs/day: 1.00  ?  Years: 10.00  ?  Pack years: 10.00  ?  Types: Cigarettes  ?  Quit date: 07/18/1968  ?  Years since quitting: 53.2  ? Smokeless tobacco: Never  ?Vaping Use  ? Vaping Use: Never used  ?Substance and Sexual Activity  ? Alcohol use: No  ? Drug use: No  ? Sexual activity: Never  ?  Partners: Male  ?Other Topics Concern  ? Not on file  ?Social History Narrative  ? Not on file  ? ?Social Determinants of Health  ? ?Financial Resource Strain: Not on file  ?Food Insecurity: Not on file  ?Transportation Needs: Not on file  ?Physical Activity: Not on file  ?Stress: Not on file  ?Social Connections: Not on file  ? ? ?Vital Signs: ?There were no vitals taken for this visit. ? ?Physical Exam ?Brief vascular exam ? ?BLE pulses 2+ DP ?L popliteal access c/d/i with no residual scar ?LLE not swollen, and appears incomparable to RLE ? ? ?Imaging: ? ?LLE Venous Duplex, 10/05/21 ?No residual DVT  within the LLE ? ?IR Venogram, 08/27/21 ?Successful LEFT lower extremity venogram, mechanical thrombectomy and iliac vein stent placement for iliocaval compression and extensive DVT. ? ? ? ? ?UKoreaVenous Img Lower Unilateral Left (DVT) ? ?Result Date: 10/05/2021 ?CLINICAL DATA:  History of acute LEFT lower extremity DVT s/p thrombectomy on 08/27/2021. EXAM: LEFT LOWER EXTREMITY VENOUS DOPPLER ULTRASOUND TECHNIQUE: Gray-scale sonography with compression, as well as color and duplex ultrasound, were performed to evaluate the deep venous system(s) from the level of the common femoral vein through the popliteal and proximal calf veins. COMPARISON:  LEFT lower extremity venous duplex, 08/24/2021. IR fluoroscopy, 08/27/2021. CT venogram, 08/24/2021. FINDINGS: VENOUS Normal compressibility of the LEFT common femoral, superficial femoral, and popliteal veins, as well as the visualized calf veins. Visualized portions of profunda femoral vein and great  saphenous vein unremarkable. No filling defects to suggest DVT on grayscale or color Doppler imaging. Doppler waveforms show normal direction of venous flow, normal respiratory plasticity and response to augmentation. Limited views of the contralateral common femoral vein are unremarkable. OTHER No evidence of superficial thrombophlebitis. A well-circumscribed anechoic subfascial collection is present at the LEFT popliteal space, measuring up to 4.4 x 2.8 cm (L x W), and consistent with a popliteal fossa/Baker's cyst. Limitations: none IMPRESSION: 1. Resolution without doppler evidence of residual femoropopliteal DVT within the LEFT lower extremity. 2. Incidental LEFT popliteal fossa/Baker's cyst. Michaelle Birks, MD [facility] Radiology Electronically Signed   By: Michaelle Birks M.D.   On: 10/05/2021 16:16   ? ?Labs: ? ?CBC: ?Recent Labs  ?  08/26/21 ?0315 08/27/21 ?0414 08/28/21 ?0415 08/29/21 ?0416  ?WBC 9.4 7.5 6.4 5.2  ?HGB 10.6*  10.4* 10.0* 9.6*  ?HCT 32.1* 32.0* 30.8* 29.6*  ?PLT 167 195 192 201  ? ? ?COAGS: ?Recent Labs  ?  08/27/21 ?0414  ?INR 1.1  ? ? ?BMP: ?Recent Labs  ?  08/15/21 ?1552 08/24/21 ?1726 08/25/21 ?6803 08/27/21 ?

## 2021-10-11 ENCOUNTER — Inpatient Hospital Stay: Payer: Medicare HMO | Attending: Hematology and Oncology | Admitting: Hematology and Oncology

## 2021-10-11 ENCOUNTER — Other Ambulatory Visit: Payer: Self-pay

## 2021-10-11 ENCOUNTER — Inpatient Hospital Stay: Payer: Medicare HMO

## 2021-10-11 ENCOUNTER — Encounter: Payer: Self-pay | Admitting: Hematology and Oncology

## 2021-10-11 VITALS — BP 129/45 | HR 75 | Temp 97.9°F | Resp 18 | Ht 66.0 in | Wt 131.6 lb

## 2021-10-11 DIAGNOSIS — Z885 Allergy status to narcotic agent status: Secondary | ICD-10-CM | POA: Insufficient documentation

## 2021-10-11 DIAGNOSIS — E538 Deficiency of other specified B group vitamins: Secondary | ICD-10-CM

## 2021-10-11 DIAGNOSIS — D509 Iron deficiency anemia, unspecified: Secondary | ICD-10-CM

## 2021-10-11 DIAGNOSIS — R600 Localized edema: Secondary | ICD-10-CM | POA: Insufficient documentation

## 2021-10-11 DIAGNOSIS — Z79899 Other long term (current) drug therapy: Secondary | ICD-10-CM | POA: Insufficient documentation

## 2021-10-11 DIAGNOSIS — Z833 Family history of diabetes mellitus: Secondary | ICD-10-CM | POA: Insufficient documentation

## 2021-10-11 DIAGNOSIS — I700 Atherosclerosis of aorta: Secondary | ICD-10-CM | POA: Insufficient documentation

## 2021-10-11 DIAGNOSIS — Z8042 Family history of malignant neoplasm of prostate: Secondary | ICD-10-CM | POA: Diagnosis not present

## 2021-10-11 DIAGNOSIS — K5150 Left sided colitis without complications: Secondary | ICD-10-CM | POA: Insufficient documentation

## 2021-10-11 DIAGNOSIS — I82422 Acute embolism and thrombosis of left iliac vein: Secondary | ICD-10-CM

## 2021-10-11 DIAGNOSIS — Z7901 Long term (current) use of anticoagulants: Secondary | ICD-10-CM | POA: Diagnosis not present

## 2021-10-11 DIAGNOSIS — Z880 Allergy status to penicillin: Secondary | ICD-10-CM | POA: Insufficient documentation

## 2021-10-11 DIAGNOSIS — K449 Diaphragmatic hernia without obstruction or gangrene: Secondary | ICD-10-CM | POA: Insufficient documentation

## 2021-10-11 DIAGNOSIS — Z79890 Hormone replacement therapy (postmenopausal): Secondary | ICD-10-CM | POA: Insufficient documentation

## 2021-10-11 DIAGNOSIS — I82502 Chronic embolism and thrombosis of unspecified deep veins of left lower extremity: Secondary | ICD-10-CM | POA: Insufficient documentation

## 2021-10-11 DIAGNOSIS — D539 Nutritional anemia, unspecified: Secondary | ICD-10-CM | POA: Diagnosis not present

## 2021-10-11 DIAGNOSIS — Z8249 Family history of ischemic heart disease and other diseases of the circulatory system: Secondary | ICD-10-CM | POA: Diagnosis not present

## 2021-10-11 LAB — CBC WITH DIFFERENTIAL (CANCER CENTER ONLY)
Abs Immature Granulocytes: 0.03 10*3/uL (ref 0.00–0.07)
Basophils Absolute: 0 10*3/uL (ref 0.0–0.1)
Basophils Relative: 1 %
Eosinophils Absolute: 0.2 10*3/uL (ref 0.0–0.5)
Eosinophils Relative: 4 %
HCT: 35.4 % — ABNORMAL LOW (ref 36.0–46.0)
Hemoglobin: 11.7 g/dL — ABNORMAL LOW (ref 12.0–15.0)
Immature Granulocytes: 1 %
Lymphocytes Relative: 33 %
Lymphs Abs: 2.1 10*3/uL (ref 0.7–4.0)
MCH: 29.9 pg (ref 26.0–34.0)
MCHC: 33.1 g/dL (ref 30.0–36.0)
MCV: 90.5 fL (ref 80.0–100.0)
Monocytes Absolute: 0.7 10*3/uL (ref 0.1–1.0)
Monocytes Relative: 11 %
Neutro Abs: 3.3 10*3/uL (ref 1.7–7.7)
Neutrophils Relative %: 50 %
Platelet Count: 266 10*3/uL (ref 150–400)
RBC: 3.91 MIL/uL (ref 3.87–5.11)
RDW: 15.5 % (ref 11.5–15.5)
WBC Count: 6.3 10*3/uL (ref 4.0–10.5)
nRBC: 0 % (ref 0.0–0.2)

## 2021-10-11 LAB — IRON AND IRON BINDING CAPACITY (CC-WL,HP ONLY)
Iron: 51 ug/dL (ref 28–170)
Saturation Ratios: 16 % (ref 10.4–31.8)
TIBC: 316 ug/dL (ref 250–450)
UIBC: 265 ug/dL (ref 148–442)

## 2021-10-11 LAB — RETICULOCYTES
Immature Retic Fract: 11.1 % (ref 2.3–15.9)
RBC.: 3.85 MIL/uL — ABNORMAL LOW (ref 3.87–5.11)
Retic Count, Absolute: 41.2 10*3/uL (ref 19.0–186.0)
Retic Ct Pct: 1.1 % (ref 0.4–3.1)

## 2021-10-11 LAB — BASIC METABOLIC PANEL - CANCER CENTER ONLY
Anion gap: 6 (ref 5–15)
BUN: 22 mg/dL (ref 8–23)
CO2: 30 mmol/L (ref 22–32)
Calcium: 9.4 mg/dL (ref 8.9–10.3)
Chloride: 105 mmol/L (ref 98–111)
Creatinine: 0.82 mg/dL (ref 0.44–1.00)
GFR, Estimated: >60 mL/min (ref >60)
Glucose, Bld: 106 mg/dL — ABNORMAL HIGH (ref 70–99)
Potassium: 3.9 mmol/L (ref 3.5–5.1)
Sodium: 141 mmol/L (ref 135–145)

## 2021-10-11 LAB — ABO/RH: ABO/RH(D): B POS

## 2021-10-11 LAB — VITAMIN B12: Vitamin B-12: 853 pg/mL (ref 180–914)

## 2021-10-11 LAB — SEDIMENTATION RATE: Sed Rate: 24 mm/hr — ABNORMAL HIGH (ref 0–22)

## 2021-10-11 NOTE — Progress Notes (Signed)
[facility] ?CONSULT NOTE ? ?Patient Care Team: ?Ashley Sacramento, MD as PCP - General (Family Medicine) ? 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 ?HISTORY OF PRESENTING ILLNESS:  ?Ashley Carrillo 81 y.o. female is here because of recent diagnosis of left lower extremity DVT ?The patient had COVID infection causing significant illness and dehydration ?She had other comorbidities predisposing her with risk of dehydration including ulcerative colitis ?She was hospitalized between 08/24/2021 to 08/29/2021 with presentation of discoloration of the left lower extremity, consistent with acute lower extremity DVT and had multiple evaluation and imaging studies ?She had history of lower extremity DVT but anticoagulation was discontinued due to chronic GI bleed from ulcerative colitis.  She recall 1 episode of DVT approximately 5 years ago and a second episode approximately 10 years ago. ?With her last DVT episode, she was only able to tolerate a short-term.  Of anticoagulation therapy due to GI bleed ? ?CT venogram 08/24/21 ?1. Acute occlusive deep venous thrombosis involving the left common iliac, external iliac, common femoral, femoral, and profundus femoral veins as above. ?2. Subcutaneous edema throughout the left lower extremity. ?3. Small hiatal hernia. ?4.  Aortic Atherosclerosis (ICD10-I70.0). ? ?08/25/21: venous Doppler ?IVC/Iliac: There is no evidence of thrombus involving the IVC. There is evidence of acute thrombus involving the left common iliac vein. There is evidence of acute thrombus involving the left external iliac vein.  ?Visualized segments of the right iliac veins appear patent.  ? ?08/27/21, she underwent successful thrombectomy.  Repeat imaging study of CT venogram showed  ?Successful LEFT lower extremity venogram, mechanical thrombectomy and iliac vein stent placement for iliocaval compression and  extensive DVT, as above. ? ?After discharge, she had follow-up appointment with interventional radiologist.  They repeated imaging study. ? ?10/05/21: Venous Doppler ?1. Resolution without doppler evidence of residual femoropopliteal DVT within the LEFT lower extremity. ?2. Incidental LEFT popliteal fossa/Baker's cyst. ? ?She denies further bleeding episodes recently while on Eliquis ?She denies leg pain or swelling ? ?She denies lower extremity swelling, warmth, tenderness & erythema.  She denies recent chest pain on exertion, shortness of breath on minimal exertion, pre-syncopal episodes, hemoptysis, or palpitation. ?Her recent blood clot was precipitated by recent COVID infection. ?She denies recent history of trauma, long distance travel, dehydration, recent surgery, smoking or prolonged immobilization. ?She had no prior history or diagnosis of cancer. Her age appropriate screening programs are up-to-date. ?She had prior surgeries before and never had perioperative thromboembolic events. ?The patient had been exposed to birth control pills and hormone replacement therapy but never had thrombotic events. ?The patient had been pregnant before and denies history of peripartum thromboembolic event or history of recurrent miscarriages. ?There is no family history of blood clots or miscarriages. ? ?MEDICAL HISTORY:  ?Past Medical History:  ?Diagnosis Date  ? Arthritis   ? Blood clot in vein   ? in groin  ? Carotid artery stenosis, asymptomatic   ? GERD (gastroesophageal reflux disease)   ? on meds  ? Hyperlipidemia   ? Trigeminal neuralgia   ? Ulcerative colitis   ? ? ?SURGICAL HISTORY: ?Past Surgical History:  ?Procedure Laterality Date  ? BALLOON DILATION  07/03/2012  ? Procedure: BALLOON DILATION;  Surgeon: Garlan Fair, MD;  Location: [facility];  Service: Endoscopy;  Laterality: N/A;  ? BALLOON DILATION N/A 11/12/2013  ? Procedure: BALLOON DILATION;  Surgeon: Garlan Fair, MD;  Location: [facility];  Service: Endoscopy;  Laterality: N/A;  ? BREAST BIOPSY  11/01/2011  ? Procedure: BREAST BIOPSY WITH NEEDLE LOCALIZATION;  Surgeon: Adin Hector, MD;  Location: [facility];  Service: General;  Laterality: Right;  ? BREAST LUMPECTOMY  1970  ? right breast- benign  ? COLONOSCOPY WITH PROPOFOL N/A 09/21/2015  ? Procedure: COLONOSCOPY WITH PROPOFOL;  Surgeon: Garlan Fair, MD;  Location: [facility];  Service: Endoscopy;  Laterality: N/A;  ? CYSTECTOMY    ? finger  ? ESOPHAGOGASTRODUODENOSCOPY N/A 11/12/2013  ? Procedure: ESOPHAGOGASTRODUODENOSCOPY (EGD);  Surgeon: Garlan Fair, MD;  Location: [facility];  Service: Endoscopy;  Laterality: N/A;  ? ESOPHAGOGASTRODUODENOSCOPY (EGD) WITH ESOPHAGEAL DILATION  07/03/2012  ? Procedure: ESOPHAGOGASTRODUODENOSCOPY (EGD) WITH ESOPHAGEAL DILATION;  Surgeon: Garlan Fair, MD;  [facility]: [facility];  Service: Endoscopy;  Laterality: N/A;  ? ESOPHAGOGASTRODUODENOSCOPY (EGD) WITH PROPOFOL N/A 09/21/2015  ? Procedure: ESOPHAGOGASTRODUODENOSCOPY (EGD) WITH PROPOFOL;  Surgeon: Garlan Fair, MD;  Location: [facility];  Service: Endoscopy;  Laterality: N/A;  ? IR THROMBECT SEC MECH MOD SED  08/27/2021  ? IR US GUIDE VASC ACCESS RIGHT  08/27/2021  ? ? ?SOCIAL HISTORY: ?Social History  ? ?Socioeconomic History  ? Marital status: Married  ?  Spouse name: Ashley Carrillo  ? Number of children: 1  ? Years of education: Not on file  ? Highest education level: Not on file  ?Occupational History  ? Occupation: retired  ?Tobacco Use  ? Smoking status: Former  ?  Packs/day: 1.00  ?  Years: 10.00  ?  Pack years: 10.00  ?  Types: Cigarettes  ?  Quit date: 07/18/1968  ?  Years since quitting: 53.2  ? Smokeless tobacco: Never  ?Vaping Use  ? Vaping Use: Never used  ?Substance and Sexual Activity  ? Alcohol use: No  ? Drug use: No  ? Sexual activity: Never  ?  Partners: Male  ?Other Topics Concern  ? Not on file  ?Social History Narrative  ? Not on file  ? ?Social Determinants of  Health  ? ?Financial Resource Strain: Not on file  ?Food Insecurity: Not on file  ?Transportation Needs: Not on file  ?Physical Activity: Not on file  ?Stress: Not on file  ?Social Connections: Not on file  ?Intimate Partner Violence: Not on file  ? ? ?FAMILY HISTORY: ?Family History  ?Problem Relation Age of Onset  ? Heart disease Mother   ? Prostate cancer Brother   ? Diabetes Brother   ? Other Brother   ?     unknown cause age 33  ? Colon cancer Neg Hx   ? Stomach cancer Neg Hx   ? Colon polyps Neg Hx   ? Esophageal cancer Neg Hx   ? Rectal cancer Neg Hx   ? ? ?ALLERGIES:  is allergic to amoxicillin-pot clavulanate, codeine, simvastatin, warfarin, and warfarin sodium. ? ?MEDICATIONS:  ?Current Outpatient Medications  ?Medication Sig Dispense Refill  ? apixaban (ELIQUIS) 5 MG TABS tablet Take 1 tablet (5 mg total) by mouth 2 (two) times daily. 60 tablet 3  ? cholecalciferol (VITAMIN D3) 25 MCG (1000 UNIT) tablet Take 1,000 Units by mouth daily.    ? cyanocobalamin 1000 MCG tablet Take 1,000 mcg by mouth daily.    ? hydrocortisone 2.5 % ointment Apply 1 application topically daily as needed (to affected area).    ? mesalamine (LIALDA) 1.2 g EC tablet Take 4 tablets (4.8 g total) by mouth daily with breakfast. You are overdue for an  appointment. Please call to schedule asap. Appointment needed for further refills. (Patient taking differently: Take 2.4 g by mouth 2 (two) times daily with a meal.) 120 tablet 1  ? pantoprazole (PROTONIX) 20 MG tablet Take 1 tablet (20 mg total) by mouth daily. (Patient taking differently: Take 20 mg by mouth daily before breakfast.) 90 tablet 1  ? ?No current facility-administered medications for this visit.  ? ? ?REVIEW OF SYSTEMS:   ?Constitutional: Denies fevers, chills or abnormal night sweats ?Eyes: Denies blurriness of vision, double vision or watery eyes ?Ears, nose, mouth, throat, and face: Denies mucositis or sore throat ?Respiratory: Denies cough, dyspnea or  wheezes ?Cardiovascular: Denies palpitation, chest discomfort or lower extremity swelling ?Gastrointestinal:  Denies nausea, heartburn or change in bowel habits ?Skin: Denies abnormal skin rashes ?Lymphatics: Denies new lymphade

## 2021-10-12 DIAGNOSIS — D539 Nutritional anemia, unspecified: Secondary | ICD-10-CM | POA: Insufficient documentation

## 2021-10-12 LAB — FERRITIN: Ferritin: 61 ng/mL (ref 11–307)

## 2021-10-12 NOTE — Assessment & Plan Note (Signed)
She had multifactorial anemia ?Repeat iron studies and B12 were adequate ?I reviewed results of her blood count with the patient ?She does not need long-term follow-up with me ?

## 2021-10-12 NOTE — Assessment & Plan Note (Signed)
She denies recent recurrent GI bleed while on anticoagulation therapy ?I will defer to her gastroenterologist for management ?If she have recurrent bleeding episode, I advised the patient to contact me ?

## 2021-10-12 NOTE — Assessment & Plan Note (Signed)
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

## 2021-10-17 ENCOUNTER — Other Ambulatory Visit: Payer: Self-pay | Admitting: Gastroenterology

## 2021-10-18 ENCOUNTER — Other Ambulatory Visit: Payer: Self-pay

## 2021-10-18 MED ORDER — MESALAMINE 1.2 G PO TBEC: 2.4000 g | DELAYED_RELEASE_TABLET | Freq: Two times a day (BID) | ORAL | 0 refills | Status: DC

## 2021-11-18 ENCOUNTER — Other Ambulatory Visit: Payer: Self-pay | Admitting: Interventional Radiology

## 2021-11-18 ENCOUNTER — Other Ambulatory Visit: Payer: Self-pay | Admitting: Gastroenterology

## 2021-11-18 DIAGNOSIS — I82402 Acute embolism and thrombosis of unspecified deep veins of left lower extremity: Secondary | ICD-10-CM

## 2021-11-20 ENCOUNTER — Other Ambulatory Visit: Payer: Self-pay | Admitting: Gastroenterology

## 2021-12-21 ENCOUNTER — Other Ambulatory Visit: Payer: Self-pay | Admitting: Gastroenterology

## 2021-12-23 ENCOUNTER — Telehealth: Payer: Self-pay

## 2021-12-23 ENCOUNTER — Other Ambulatory Visit: Payer: Self-pay | Admitting: Gastroenterology

## 2021-12-23 NOTE — Telephone Encounter (Signed)
Called and spoke to patient.  Refill request rec'd for Lialda. However, She needs a F/U appointment for UC. She agreed to schedule an appointment for August 2nd.  Refill sent for 30 days with a refill to get to August appointment.

## 2021-12-27 ENCOUNTER — Ambulatory Visit
Admission: RE | Admit: 2021-12-27 | Discharge: 2021-12-27 | Disposition: A | Discharge status: Home / Self Care | Payer: Medicare HMO | Source: Ambulatory Visit | Attending: Interventional Radiology | Admitting: Interventional Radiology

## 2021-12-27 DIAGNOSIS — I82402 Acute embolism and thrombosis of unspecified deep veins of left lower extremity: Secondary | ICD-10-CM

## 2021-12-29 ENCOUNTER — Encounter: Payer: Self-pay | Admitting: *Deleted

## 2021-12-29 ENCOUNTER — Ambulatory Visit
Admission: RE | Admit: 2021-12-29 | Discharge: 2021-12-29 | Disposition: A | Discharge status: Home / Self Care | Payer: Medicare HMO | Source: Ambulatory Visit | Attending: Interventional Radiology | Admitting: Interventional Radiology

## 2021-12-29 DIAGNOSIS — I82402 Acute embolism and thrombosis of unspecified deep veins of left lower extremity: Secondary | ICD-10-CM

## 2021-12-29 HISTORY — PX: IR RADIOLOGIST EVAL & MGMT: IMG5224

## 2021-12-29 NOTE — Progress Notes (Signed)
Reason for visit: s/p treatment for extensive LLE DVT  Care Team:  Primary Care: [name], MD Hematology: Heath Lark, MD Gastroenterology: Yetta Flock, MD  Virtual Visit via Telephone Note   I connected with Mrs. Ashley Carrillo at [id number] EST by telephone and verified that I am speaking with the correct person using two identifiers.   I discussed the limitations, risks, security and privacy concerns of performing an evaluation and management service by telephone and the availability of in-person appointments.  History of present illness:  81 y/o F w PMHx significant for UC, trigeminal neuralgia s/p GK, and prior LLE DVTs. 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  Pt was discharged on Eliquis and denies any bleeding event. She was seen in initial VIR follow up in 10/05/21 and repeat LLE venous doppler was negative. She has also seen Dr. Alvy Bimler (Heme/Onc) for [facility] follow up. Recent 3 month follow up LLE venous doppler (12/27/21) was also negative. She denies any LLE swelling. Endorses L knee pain from known advanced L knee OA.  Review of Systems: A 12-point ROS discussed, and pertinent positives are indicated in the HPI above.  All other systems are negative.   Past Medical History:  Diagnosis Date   Arthritis    Blood clot in vein    in groin   Carotid artery stenosis, asymptomatic    GERD (gastroesophageal reflux disease)    on meds   Hyperlipidemia    Trigeminal neuralgia    Ulcerative colitis     Past Surgical History:  Procedure Laterality Date   BALLOON DILATION  07/03/2012   Procedure: BALLOON DILATION;  Surgeon: Garlan Fair, MD;  Location: [facility];  Service: Endoscopy;  Laterality: N/A;   BALLOON DILATION N/A 11/12/2013   Procedure: BALLOON DILATION;   Surgeon: Garlan Fair, MD;  [facility];  Service: Endoscopy;  Laterality: N/A;   BREAST BIOPSY  11/01/2011   Procedure: BREAST BIOPSY WITH NEEDLE LOCALIZATION;  Surgeon: Adin Hector, MD;  Location: [place];  Service: General;  Laterality: Right;   BREAST LUMPECTOMY  1970   right breast- benign   COLONOSCOPY WITH PROPOFOL N/A 09/21/2015   Procedure: COLONOSCOPY WITH PROPOFOL;  Surgeon: Garlan Fair, MD;  [facility];  Service: Endoscopy;  Laterality: N/A;   CYSTECTOMY     finger   ESOPHAGOGASTRODUODENOSCOPY N/A 11/12/2013   Procedure: ESOPHAGOGASTRODUODENOSCOPY (EGD);  Surgeon: Garlan Fair, MD;  Location: [facility];  Service: Endoscopy;  Laterality: N/A;   ESOPHAGOGASTRODUODENOSCOPY (EGD) WITH ESOPHAGEAL DILATION  07/03/2012   Procedure: ESOPHAGOGASTRODUODENOSCOPY (EGD) WITH ESOPHAGEAL DILATION;  Surgeon: Garlan Fair, MD;  [facility];  Service: Endoscopy;  Laterality: N/A;   ESOPHAGOGASTRODUODENOSCOPY (EGD) WITH PROPOFOL N/A 09/21/2015   Procedure: ESOPHAGOGASTRODUODENOSCOPY (EGD) WITH PROPOFOL;  Surgeon: Garlan Fair, MD;  [facility];  Service: Endoscopy;  Laterality: N/A;   IR THROMBECT SEC MECH MOD SED  08/27/2021   IR US GUIDE VASC ACCESS RIGHT  08/27/2021    Allergies: Amoxicillin-pot clavulanate, Codeine, Simvastatin, Warfarin, and Warfarin sodium  Medications: Prior to Admission medications   Medication Sig Start Date End Date Taking? Authorizing Provider  apixaban (ELIQUIS) 5 MG TABS tablet Take 1 tablet (5 mg total) by mouth 2 (two) times daily. 08/29/21   Charlynne Cousins, MD  cholecalciferol (VITAMIN D3) 25 MCG (1000 UNIT) tablet Take 1,000 Units by mouth daily.    [provider]  cyanocobalamin 1000 MCG tablet Take 1,000 mcg by mouth daily.    [provider]  hydrocortisone 2.5 % ointment Apply 1 application topically daily as needed (to affected area).    [provider]  mesalamine (LIALDA) 1.2 g EC tablet Take 2 tablets (2.4 g total) by mouth 2 (two) times daily. Please keep your 8-2 appointment for further refills. Thank you 12/23/21   Yetta Flock, MD  pantoprazole (PROTONIX) 20 MG tablet Take 1 tablet (20 mg total) by mouth daily. Patient taking differently: Take 20 mg by mouth daily before breakfast. 02/10/21   Armbruster, Carlota Raspberry, MD     Family History  Problem Relation Age of Onset   Heart disease Mother    Prostate cancer Brother    Diabetes Brother    Other Brother        unknown cause age 24   Colon cancer Neg Hx    Stomach cancer Neg Hx    Colon polyps Neg Hx    Esophageal cancer Neg Hx    Rectal cancer Neg Hx     Social History   Socioeconomic History   Marital status: Married    Spouse name: Mallie Mussel   Number of children: 1   Years of education: Not on file   Highest education level: Not on file  Occupational History   Occupation: retired  Tobacco Use   Smoking status: Former    Packs/day: 1.00    Years: 10.00    Total pack years: 10.00    Types: Cigarettes    Quit date: 07/18/1968    Years since quitting: 53.4   Smokeless tobacco: Never  Vaping Use   Vaping Use: Never used  Substance and Sexual Activity   Alcohol use: No   Drug use: No   Sexual activity: Never    Partners: Male  Other Topics Concern   Not on file  Social History Narrative   Not on file   Social Determinants of Health   Financial Resource Strain: Not on file  Food Insecurity: Not on file  Transportation Needs: Not on file  Physical Activity: Not on file  Stress: Not on file  Social Connections: Not on file    Vital Signs: There were no vitals taken for this visit.  Physical Exam  Deferred, secondary to virtual visit.   Imaging: US Venous Img Lower Unilateral Left (DVT)  Result Date: 12/27/2021 CLINICAL DATA:  F/u LLE Thrombectomy 08/28/2021 EXAM: LEFT LOWER EXTREMITY VENOUS DOPPLER ULTRASOUND TECHNIQUE: Gray-scale  sonography with compression, as well as color and duplex ultrasound, were performed to evaluate the deep venous system(s) from the level of the common femoral vein through the popliteal and proximal calf veins. COMPARISON:  LEFT lower extremity venous duplex 10/05/2021 and 08/24/2020. IR fluoroscopy, 08/27/2021. CT venogram, 08/24/2021. FINDINGS: VENOUS Normal compressibility of the LEFT common femoral, superficial femoral, and popliteal veins, as well as the visualized calf veins. Visualized portions of profunda femoral vein and great saphenous vein unremarkable. No filling defects to suggest DVT on grayscale or color Doppler imaging. Doppler waveforms show normal direction of venous flow, normal respiratory plasticity and response to augmentation. Limited views of the contralateral common femoral vein are unremarkable. OTHER No evidence of superficial thrombophlebitis or abnormal fluid collection. Similar appearance of well-circumscribed, anechoic subfascial collection at the LEFT popliteal space, measuring 4.8 x 2.2 x 2.0 cm, consistent  with a popliteal fossa/Baker cyst. Limitations: none IMPRESSION: 1. No residual or recurrent femoropopliteal DVT within the LEFT lower extremity. 2. Incidental LEFT popliteal fossa/Baker cyst. Michaelle Birks, MD [facility] Radiology Electronically Signed   By: Michaelle Birks M.D.   On: 12/27/2021 14:52     Assessment and Plan:  81 y/o F w PMHx significant for UC and prior LLE DVTs, known to me for extensive LLE DVT after acute COVID-19 infection in 08/24/21, now s/p LLE venogram, thrombectomy and CIV stent placement on 08/27/21.    *Resolution without residual thrombus within the LLE on repeat venous duplex. *No concern at this time, thus no additional scheduled follow up per VIR perspective. Follow up PRN *PO AC per Heme Onc. *continue lower extremity compression stockings as tolerated, to augment venous pump.   Thank you  for allowing [place] to participate in the care of your Patient. Signed:  Michaelle Birks, MD [facility] Radiology   Pager. [phone] Clinic. [phone]   I spent a total of 25 Minutes of non-face-to-face time in clinical consultation, greater than 50% of which was counseling/coordinating care for Mrs. Carlisle Beers Denicola in followup for LLE DVT treatment.

## 2022-02-16 ENCOUNTER — Ambulatory Visit: Payer: Medicare HMO | Admitting: Gastroenterology

## 2022-02-24 ENCOUNTER — Other Ambulatory Visit: Payer: Self-pay | Admitting: Gastroenterology

## 2022-03-07 ENCOUNTER — Telehealth: Payer: Self-pay

## 2022-03-07 ENCOUNTER — Other Ambulatory Visit: Payer: Self-pay | Admitting: Hematology and Oncology

## 2022-03-07 DIAGNOSIS — I82422 Acute embolism and thrombosis of left iliac vein: Secondary | ICD-10-CM

## 2022-03-07 MED ORDER — APIXABAN 5 MG PO TABS
5.0000 mg | ORAL_TABLET | Freq: Two times a day (BID) | ORAL | 9 refills | Status: DC
Start: 2022-03-07 — End: 2022-10-25

## 2022-03-07 NOTE — Telephone Encounter (Signed)
That is correct Some PCP do not want to refill it; if that is the case, I can refill it but then she would need to see me once a year

## 2022-03-07 NOTE — Telephone Encounter (Signed)
She called and left a message. She is asking if she should continue taking Eliquis? She got a 1 month supply from PCP and has 1 pill left.  Per your note she is continue Eliquis long term and follow up with PCP?

## 2022-03-07 NOTE — Telephone Encounter (Signed)
Called and given below message. She verbalized understanding and appreciated the call. Her PCP will not refill Eliquis. She would like Rx to [facility] and  would like to see Dr. Alvy Bimler yearly.

## 2022-03-07 NOTE — Telephone Encounter (Signed)
Sent refill and LOS

## 2022-03-14 ENCOUNTER — Encounter: Payer: Self-pay | Admitting: *Deleted

## 2022-03-18 ENCOUNTER — Other Ambulatory Visit (INDEPENDENT_AMBULATORY_CARE_PROVIDER_SITE_OTHER): Payer: Medicare HMO

## 2022-03-18 ENCOUNTER — Encounter: Payer: Self-pay | Admitting: Gastroenterology

## 2022-03-18 ENCOUNTER — Ambulatory Visit: Payer: Medicare HMO | Admitting: Gastroenterology

## 2022-03-18 VITALS — BP 122/68 | HR 64 | Ht 66.5 in | Wt 135.6 lb

## 2022-03-18 DIAGNOSIS — K222 Esophageal obstruction: Secondary | ICD-10-CM | POA: Diagnosis not present

## 2022-03-18 DIAGNOSIS — K2270 Barrett's esophagus without dysplasia: Secondary | ICD-10-CM

## 2022-03-18 DIAGNOSIS — Z7901 Long term (current) use of anticoagulants: Secondary | ICD-10-CM

## 2022-03-18 DIAGNOSIS — Z79899 Other long term (current) drug therapy: Secondary | ICD-10-CM

## 2022-03-18 DIAGNOSIS — K5150 Left sided colitis without complications: Secondary | ICD-10-CM

## 2022-03-18 LAB — CBC WITH DIFFERENTIAL/PLATELET
Basophils Absolute: 0 10*3/uL (ref 0.0–0.1)
Basophils Relative: 0.4 % (ref 0.0–3.0)
Eosinophils Absolute: 0.3 10*3/uL (ref 0.0–0.7)
Eosinophils Relative: 4 % (ref 0.0–5.0)
HCT: 36.1 % (ref 36.0–46.0)
Hemoglobin: 11.9 g/dL — ABNORMAL LOW (ref 12.0–15.0)
Lymphocytes Relative: 39.4 % (ref 12.0–46.0)
Lymphs Abs: 2.6 10*3/uL (ref 0.7–4.0)
MCHC: 32.9 g/dL (ref 30.0–36.0)
MCV: 87.7 fl (ref 78.0–100.0)
Monocytes Absolute: 0.6 10*3/uL (ref 0.1–1.0)
Monocytes Relative: 9.5 % (ref 3.0–12.0)
Neutro Abs: 3.1 10*3/uL (ref 1.4–7.7)
Neutrophils Relative %: 46.7 % (ref 43.0–77.0)
Platelets: 224 10*3/uL (ref 150.0–400.0)
RBC: 4.11 Mil/uL (ref 3.87–5.11)
RDW: 16.3 % — ABNORMAL HIGH (ref 11.5–15.5)
WBC: 6.7 10*3/uL (ref 4.0–10.5)

## 2022-03-18 NOTE — Patient Instructions (Signed)
Please follow up with Dr Havery [name] in 6 months.  Your provider has requested that you go to the basement level for lab work before leaving today. Press "B" on the elevator. The lab is located at the first door on the left as you exit the elevator.  _______________________________________________________  If you are age 82 or older, your body mass index should be between 23-30. Your Body mass index is 21.56 kg/m. If this is out of the aforementioned range listed, please consider follow up with your Primary Care Provider.  If you are age 40 or younger, your body mass index should be between 19-25. Your Body mass index is 21.56 kg/m. If this is out of the aformentioned range listed, please consider follow up with your Primary Care Provider.   ________________________________________________________  The [facility] providers would like to encourage you to use [facility] to communicate with providers for non-urgent requests or questions.  Due to long hold times on the telephone, sending your provider a message by [facility] may be a faster and more efficient way to get a response.  Please allow 48 business hours for a response.  Please remember that this is for non-urgent requests.  _______________________________________________________  Due to recent changes in healthcare laws, you may see the results of your imaging and laboratory studies on MyChart before your provider has had a chance to review them.  We understand that in some cases there may be results that are confusing or concerning to you. Not all laboratory results come back in the same time frame and the provider may be waiting for multiple results in order to interpret others.  Please give [place] 48 hours in order for your provider to thoroughly review all the results before contacting the office for clarification of your results.

## 2022-03-18 NOTE — Progress Notes (Signed)
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 She has been on prednisone over the years as needed for flares.  Historically had not  needed prednisone for a flare for several years until May 2019. She has been doing well for a long time.  She has never been on anti-TNFs or immunomodulators in the past.  She has been off all medications for a few years at least then had some flares in 2019 and 2020, now on Lialda.     SINCE LAST VISIT 81 year old female here for follow-up for left-sided colitis, dysphagia, last seen in September 2022  Recall she had a longstanding history of left-sided colitis.  Maintained on Lialda in recent years after she had multiple flares in 2019/2020 leading to steroid use, previously she had been doing okay off therapy.  Her last colonoscopy was in 2017 off therapy and it was normal.  We had discussed doing colonoscopies in the past but she had declined.  Generally she has been doing okay in regards to her colitis.  She is taking 4 Lialda's per day.  She did not have any blood in her stools.  She states she has had occasional loose stools but generally not significant.  No abdominal pains that bother her.  She had a very complicated course in February when she presented with a DVT secondary to [place].  She underwent thrombectomy and has been on anticoagulation with Eliquis since then.  She was concerned how she would tolerate anticoagulation in regards to her bleeding risk etc. but she has been doing well without any rectal bleeding.  She does have significant fatigue and has been slow to recover from that hospitalization.  It took her quite a bit of time.  Recall we had also performed an EGD for her last July when she presented with dysphagia.  She had a very tight stricture at the GEJ which the scope itself dilated and caused mucosal rents.  Biopsies of the stricture returned as "Barrett's esophagus".  There was no  overt Barrett's on that exam.  We repeated an EGD last September, balloon dilated the stricture up to 13 mm and took more biopsies even though no overt Barrett's, with pathology showing Barrett's esophagus without dysplasia.  She states the last endoscopy resolved her dysphagia and has not really recurred.  She is eating well without problems although generally takes her time and eats very slowly to prevent symptoms.  I had her on Protonix 20 mg daily given inflammatory changes noted on the initial EGD and to prevent restructuring.  She has been compliant with this.  She states she tolerates it well and has no reflux symptoms on the regimen.  She does have a history of osteopenia.  Her renal function has been normal.  We discussed if she wanted to pursue a follow-up EGD for this, and she does not as outlined below.   Endoscopic history: EGD 09/21/2015 - benign stricture at the GEJ, dilated to 33m Colonoscopy 09/21/2015 - normal colon, adenoma x 2(525m, no dysplasia - done off all medications   Fecal calprotectin 05/17/19 - 29   EGD 02/10/21: - A 2 cm hiatal hernia was present. - One benign-appearing, intrinsic severe stenosis was found. This stenosis measured 6-7 mm (inner diameter) or so x less than one cm (in length). The stenosis was traversed with the upper endoscope, which dilated it in itself, causing multiple appropriate mucosal wrents. There was nodular /  inflammatory changes at the stricture, suspect benign inflammatory / reflux, but biopsies were taken with a cold forceps for histology and to open the stricture further. - The exam of the esophagus was otherwise normal. - The entire examined stomach was normal. 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 - The exam of the duodenum was otherwise normal.  Surgical [P], GE junction - INTESTINAL METAPLASIA CONSISTENT WITH BARRETT'S ESOPHAGUS. - NO DYSPLASIA OR MALIGNANCY.   EGD 03/19/21: - A 2 cm hiatal hernia was  present. - One benign-appearing, intrinsic moderate stenosis was found. This stenosis measured less than one cm (in length). A TTS dilator was passed through the scope. Dilation with an 11- 12-13 mm balloon dilator was performed to 11 mm, 12 mm and 13 mm. Appropriate mucosal wrents were noted. Biopsies were taken with a cold forceps for histology. No obvious Barrett's seen. - The exam of the esophagus was otherwise normal. - The entire examined stomach was normal. - There was benign ectopic gastric mucosa of the bulb noted. The examined duodenum was normal.  Surgical [P], esophageal stricture site bx AMENDED DIAGNOSIS: - GASTROESOPHAGEAL MUCOSA WITH INTESTINAL METAPLASIA CONSISTENT WITH BARRETT'S ESOPHAGUS. - NO DYSPLASIA OR CARCINOMA. - SEE MICROSCOPIC DESCRIPTION.  Past Medical History:  Diagnosis Date   Arthritis    Blood clot in vein    in groin   Carotid artery stenosis, asymptomatic    COVID-19    DVT (deep vein thrombosis) in pregnancy 2022   Esophageal stenosis    GERD (gastroesophageal reflux disease)    on meds   Hiatal hernia    Hyperlipidemia    Sinusitis    Trigeminal neuralgia    Ulcerative colitis      Past Surgical History:  Procedure Laterality Date   BALLOON DILATION  07/03/2012   Procedure: BALLOON DILATION;  Surgeon: Garlan Fair, MD;  [facility];  Service: Endoscopy;  Laterality: N/A;   BALLOON DILATION N/A 11/12/2013   Procedure: BALLOON DILATION;  Surgeon: Garlan Fair, MD;  [facility];  Service: Endoscopy;  Laterality: N/A;   BREAST BIOPSY  11/01/2011   Procedure: BREAST BIOPSY WITH NEEDLE LOCALIZATION;  Surgeon: Adin Hector, MD;  Location: [place];  Service: General;  Laterality: Right;   BREAST LUMPECTOMY  1970   right breast- benign   COLONOSCOPY WITH PROPOFOL N/A 09/21/2015   Procedure: COLONOSCOPY WITH PROPOFOL;  Surgeon: Garlan Fair, MD;  [facility];  Service: Endoscopy;   Laterality: N/A;   CYSTECTOMY     finger   ESOPHAGOGASTRODUODENOSCOPY N/A 11/12/2013   Procedure: ESOPHAGOGASTRODUODENOSCOPY (EGD);  Surgeon: Garlan Fair, MD;  [facility];  Service: Endoscopy;  Laterality: N/A;   ESOPHAGOGASTRODUODENOSCOPY (EGD) WITH ESOPHAGEAL DILATION  07/03/2012   Procedure: ESOPHAGOGASTRODUODENOSCOPY (EGD) WITH ESOPHAGEAL DILATION;  Surgeon: Garlan Fair, MD;  [facility];  Service: Endoscopy;  Laterality: N/A;   ESOPHAGOGASTRODUODENOSCOPY (EGD) WITH PROPOFOL N/A 09/21/2015   Procedure: ESOPHAGOGASTRODUODENOSCOPY (EGD) WITH PROPOFOL;  Surgeon: Garlan Fair, MD;  [facility];  Service: Endoscopy;  Laterality: N/A;   IR RADIOLOGIST EVAL & MGMT  12/29/2021   IR THROMBECT SEC MECH MOD SED  08/27/2021   IR US GUIDE VASC ACCESS RIGHT  08/27/2021   Family History  Problem Relation Age of Onset   Heart disease Mother    Breast cancer Sister    Prostate cancer Brother    Diabetes Brother    Other Brother  unknown cause age 28   Colon cancer Neg Hx    Stomach cancer Neg Hx    Colon polyps Neg Hx    Esophageal cancer Neg Hx    Rectal cancer Neg Hx    Social History   Tobacco Use   Smoking status: Former    Packs/day: 1.00    Years: 10.00    Total pack years: 10.00    Types: Cigarettes    Quit date: 07/18/1968    Years since quitting: 53.7   Smokeless tobacco: Never  Vaping Use   Vaping Use: Never used  Substance Use Topics   Alcohol use: No   Drug use: No   Current Outpatient Medications  Medication Sig Dispense Refill   apixaban (ELIQUIS) 5 MG TABS tablet Take 1 tablet (5 mg total) by mouth 2 (two) times daily. 60 tablet 9   cholecalciferol (VITAMIN D3) 25 MCG (1000 UNIT) tablet Take 1,000 Units by mouth daily.     cyanocobalamin 1000 MCG tablet Take 1,000 mcg by mouth daily.     hydrocortisone 2.5 % ointment Apply 1 application topically daily as needed (to affected area).     mesalamine (LIALDA) 1.2 g EC  tablet TAKE 2 TABLETS BY MOUTH TWICE DAILY. PLEASE KEEP YOUR 8-2 APPOINTMENT FOR FURTHER REFILLS 120 tablet 0   pantoprazole (PROTONIX) 20 MG tablet Take 1 tablet (20 mg total) by mouth daily. (Patient taking differently: Take 20 mg by mouth daily before breakfast.) 90 tablet 1   No current facility-administered medications for this visit.   Allergies  Allergen Reactions   Amoxicillin-Pot Clavulanate Diarrhea   Codeine Nausea And Vomiting and Other (See Comments)    "drunk feeling and it puts me on the floor"    Simvastatin Other (See Comments)    "Made my legs hurt terribly bad"   Warfarin Other (See Comments)    History of "ulcerative colitis"   Warfarin Sodium Other (See Comments)    History of "ulcerative colitis" and "Blood thinning, so contraindicated"     Review of Systems: All systems reviewed and negative except where noted in HPI.     Lab Results  Component Value Date   CREATININE 0.82 10/11/2021   BUN 22 10/11/2021   NA 141 10/11/2021   K 3.9 10/11/2021   CL 105 10/11/2021   CO2 30 10/11/2021    Lab Results  Component Value Date   ALT 9 08/15/2021   AST 18 08/15/2021   ALKPHOS 46 08/15/2021   BILITOT 0.4 08/15/2021     Physical Exam: BP 122/68   Pulse 64   Ht 5' 6.5" (1.689 m)   Wt 135 lb 9.6 oz (61.5 kg)   SpO2 98%   BMI 21.56 kg/m  Constitutional: Pleasant,well-developed, female in no acute distress. Neurological: Alert and oriented to person place and time. Psychiatric: Normal mood and affect. Behavior is normal.   ASSESSMENT: 81 y.o. female here for assessment of the following  1. Left sided ulcerative colitis without complication ([place])   2. Esophageal stricture   3. Barrett's esophagus without dysplasia   4. Long-term current use of proton pump inhibitor therapy   5. Anticoagulated    Chronic left-sided colitis on Lialda, having some intermittent loose stools since has been on Eliquis although appears to be tolerating anticoagulation  quite well for her DVT.  We discussed if she wanted to have a colonoscopy given her longstanding colitis, its been a while since her last exam, she has been on new therapy since  then, and her loose stools.  She really wishes to avoid colonoscopy if at all possible at her age, she is also still recovering from her DVT thrombectomy.  She has a lot of fatigue.  I will check a CBC to make sure no anemia.  She is willing to do a fecal calprotectin to see if we can get a sense of if she has active inflammation on her colon.  She will submit that at her convenience.  Otherwise continue her present regimen for now.  We discussed her history of esophageal stricture and biopsies concerning for Barrett's even though no overt evidence of Barrett's was noted.  There was no dysplasia on a few biopsy samples.  Her dysphagia has resolved which is good.  We discussed if she wanted a surveillance endoscopy at any point for this.  Again she really is not interested in pursuing an endoscopy as long as her dysphagia does not recur.  We discussed risks of endoscopy and risks of not doing endoscopy, she is comfortable observing for now with continuing PPI.  We discussed long-term risks of chronic PPI use, she does have osteopenia but no significant fracture history.  She will continue Protonix for now hopefully that will reduce her risk for restructuring and treat Barrett's if that is present.  I will see her for follow-up in 6 months otherwise.   PLAN: - lab for CBC - lab for fecal calprotectin - she declines colonoscopy for now - she declines surveillance EGD for now - continue protonix, discussed risks / benefits of long term PPI - continue Lialda, call if any flare of colitis - f/u 6 months  Jolly Mango, MD [facility] Gastroenterology

## 2022-03-22 ENCOUNTER — Other Ambulatory Visit: Payer: Medicare HMO

## 2022-03-22 DIAGNOSIS — K2270 Barrett's esophagus without dysplasia: Secondary | ICD-10-CM

## 2022-03-22 DIAGNOSIS — K222 Esophageal obstruction: Secondary | ICD-10-CM

## 2022-03-22 DIAGNOSIS — Z7901 Long term (current) use of anticoagulants: Secondary | ICD-10-CM

## 2022-03-22 DIAGNOSIS — Z79899 Other long term (current) drug therapy: Secondary | ICD-10-CM

## 2022-03-22 DIAGNOSIS — K5150 Left sided colitis without complications: Secondary | ICD-10-CM

## 2022-03-25 LAB — CALPROTECTIN, FECAL: Calprotectin, Fecal: 16 ug/g (ref 0–120)

## 2022-03-27 ENCOUNTER — Other Ambulatory Visit: Payer: Self-pay | Admitting: Gastroenterology

## 2022-09-23 ENCOUNTER — Telehealth: Payer: Self-pay | Admitting: Family Medicine

## 2022-09-23 NOTE — Telephone Encounter (Signed)
Per 3/8 Ib reached out to patient to reschedule, patient is aware of time and date change.

## 2022-09-27 ENCOUNTER — Other Ambulatory Visit: Payer: Medicare HMO

## 2022-09-27 ENCOUNTER — Ambulatory Visit: Payer: Medicare HMO | Admitting: Hematology and Oncology

## 2022-10-02 IMAGING — CT CT VENOGRAM ABD-PELV
2 of 9 series · 12 of 46 positions shown, 17 images · non-contrast
Comparison: None.

CLINICAL DATA: History of blood clots, left foot discoloration,
deep venous thrombosis

EXAM:
CT VENOGRAM ABDOMEN AND PELVIS
TECHNIQUE: Multidetector CT imaging of the abdomen was performed using the
standard protocol following bolus administration of intravenous
contrast, with imaging performed during the portal venous and
delayed venous phases of contrast enhancement.

[Series 2: axial venous · axial · portal-venous · 0.75mm/px · z∈[-411,-16]mm · 10 of 95 slices shown, 15 images]
[im 8/95  soft-tissue]
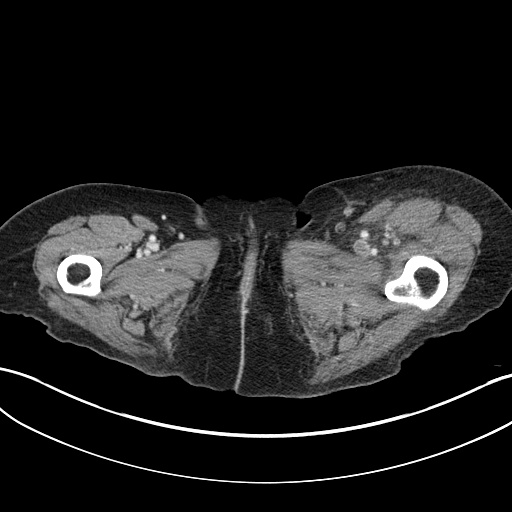
[im 8/95  bone]
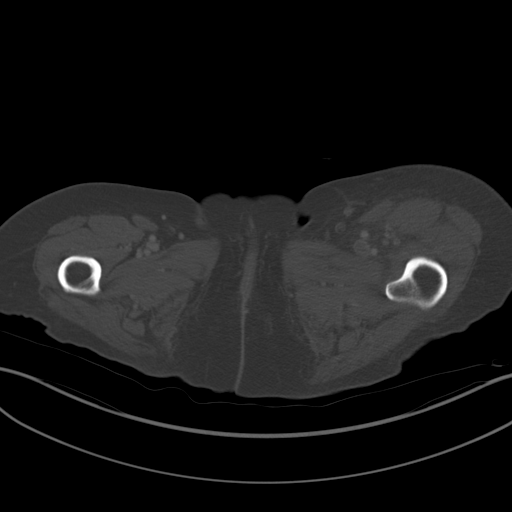
[im 22/95  soft-tissue]
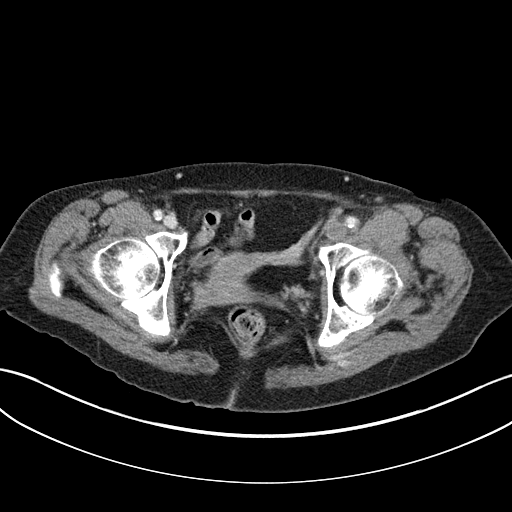
[im 29/95  soft-tissue]
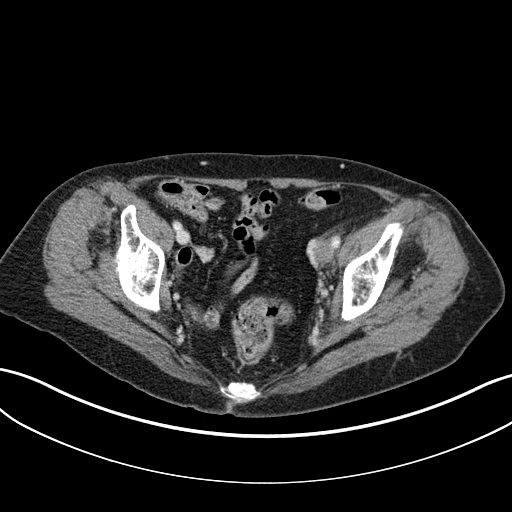
[im 37/95  soft-tissue]
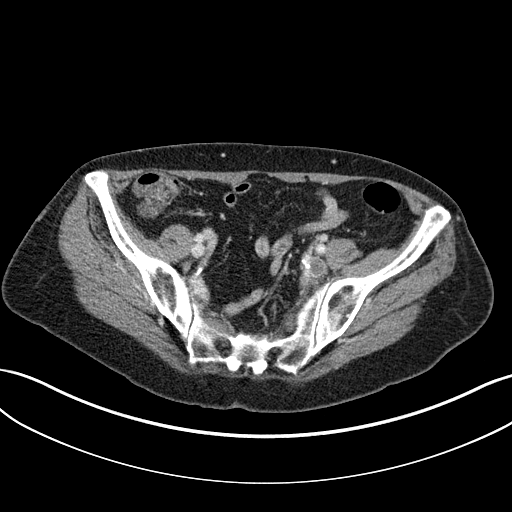
[im 51/95  soft-tissue]
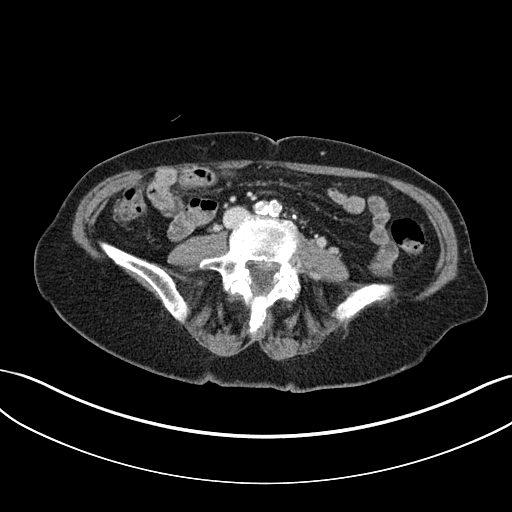
[im 58/95  soft-tissue]
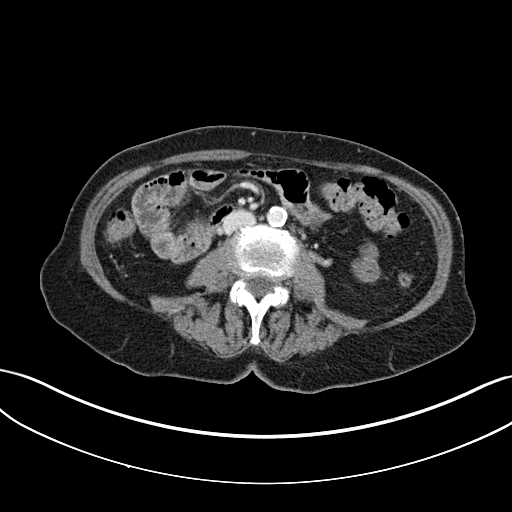
[im 66/95  soft-tissue]
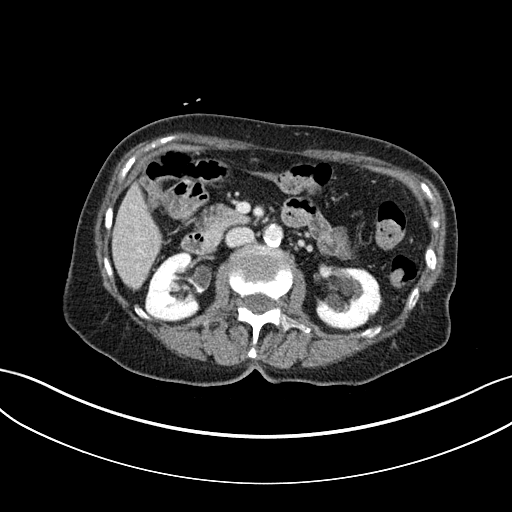
[im 66/95  lung]
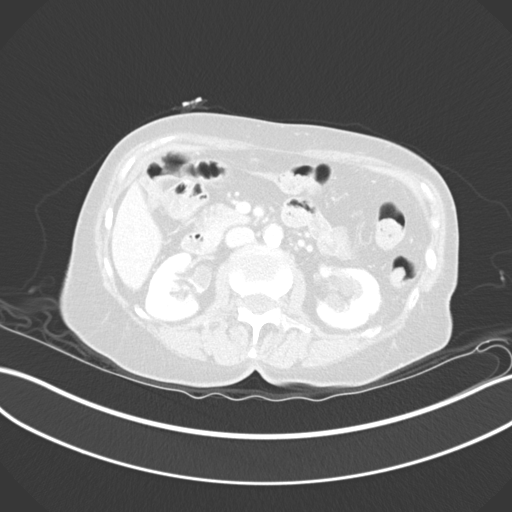
[im 73/95  lung]
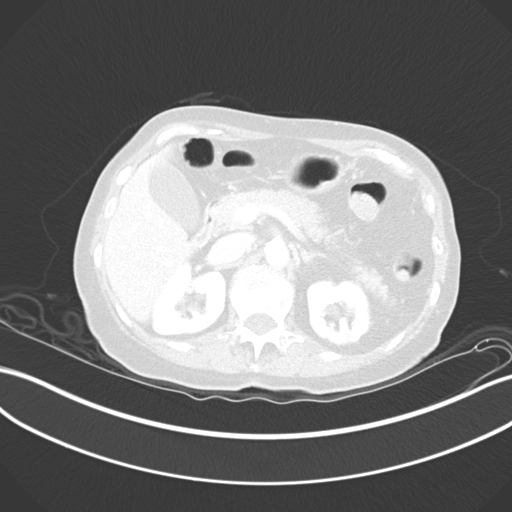
[im 80/95  soft-tissue]
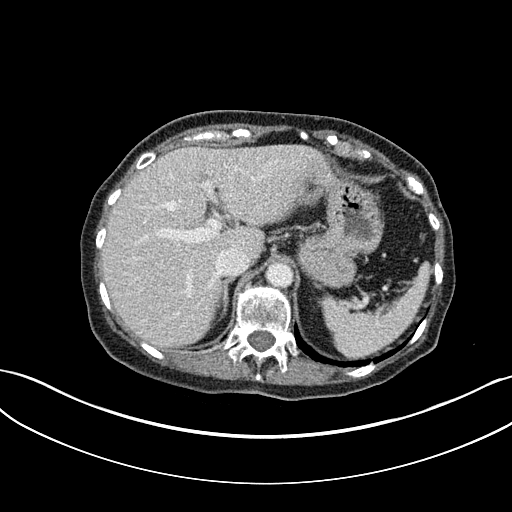
[im 80/95  lung]
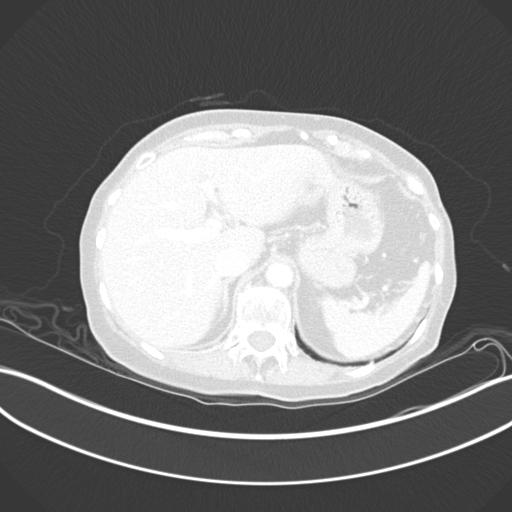
[im 87/95  soft-tissue]
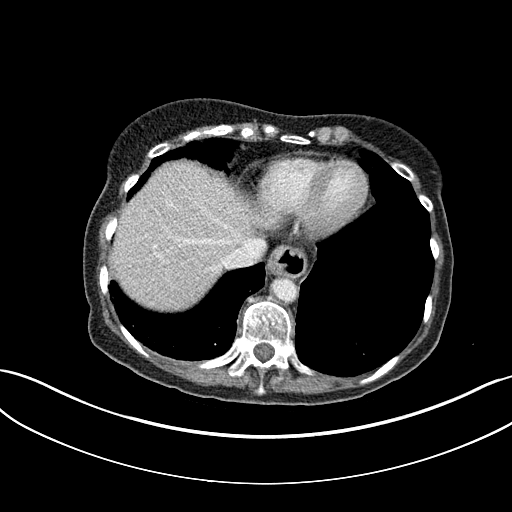
[im 87/95  lung]
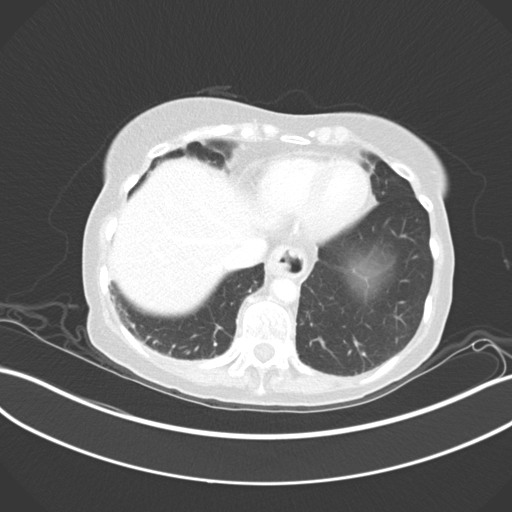
[im 87/95  bone]
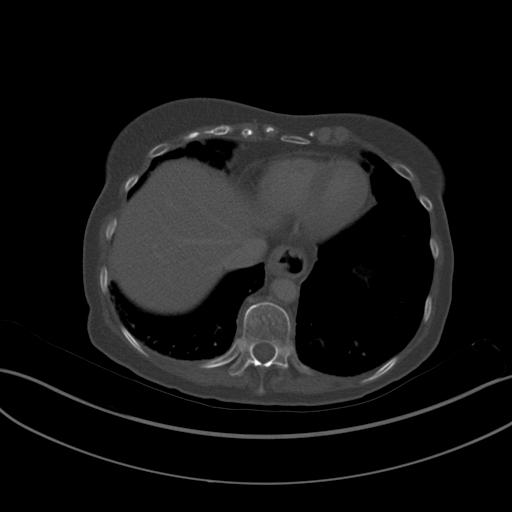

[Series 5: coronals · coronal · 0.52mm/px · 2 of 115 slices shown]
[im 39/115  soft-tissue]
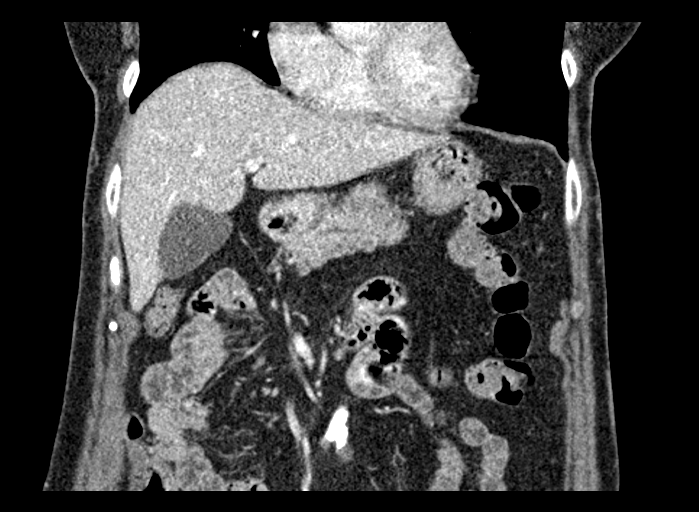
[im 77/115  soft-tissue]
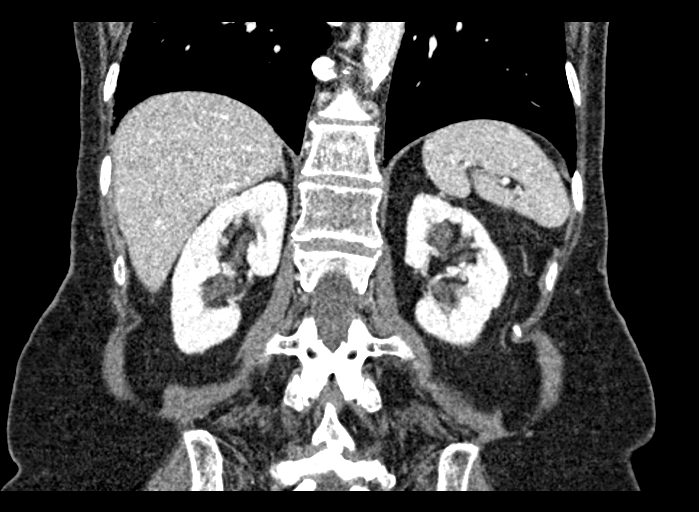

[12 of 46 positions shown; findings below may reference images not displayed]

RADIATION DOSE REDUCTION: This exam was performed according to the
departmental dose-optimization program which includes automated
exposure control, adjustment of the mA and/or kV according to
patient size and/or use of iterative reconstruction technique.

CONTRAST:  100mL OMNIPAQUE IOHEXOL 350 MG/ML SOLN
FINDINGS: Lower chest: No acute pleural or parenchymal lung disease. Small
hiatal hernia.

Hepatobiliary: No focal liver abnormality is seen. No gallstones,
gallbladder wall thickening, or biliary dilatation.

Pancreas: Unremarkable. No pancreatic ductal dilatation or
surrounding inflammatory changes.

Spleen: Normal in size without focal abnormality.

Adrenals/Urinary Tract: There are bilateral peripelvic renal cysts.
Kidneys enhance normally and symmetrically. No urinary tract calculi
or obstruction. Bladder is unremarkable. Adrenals are normal.

Stomach/Bowel: No bowel obstruction or ileus. No bowel wall
thickening or inflammatory change.

Vascular/Lymphatic: There is enlargement and occlusive thrombus
involving the left left common iliac, external iliac, common
femoral, and visualized portions of the superficial and profundus
femoral veins, consistent with acute DVT.

Diffuse atherosclerosis of the aorta.

No pathologic adenopathy of the abdomen or pelvis.

Reproductive: Uterus and bilateral adnexa are unremarkable.

Other: No free fluid or free gas.  No abdominal wall hernia.

Musculoskeletal: There is diffuse subcutaneous edema within the
visualized left lower extremity consistent with acute DVT. No acute
or destructive bony lesions. There is bilateral L5 spondylolysis
with grade 1 anterolisthesis of L5 on S1. Reconstructed images
demonstrate no additional findings.
IMPRESSION: 1. Acute occlusive deep venous thrombosis involving the left common
iliac, external iliac, common femoral, femoral, and profundus
femoral veins as above.
2. Subcutaneous edema throughout the left lower extremity.
3. Small hiatal hernia.
4.  Aortic Atherosclerosis (8MBHD-794.4).

Critical Value/emergent results were called by telephone at the time
of interpretation on 08/24/2021 at [DATE] to provider RODDY MONTALVO ,
who verbally acknowledged these results.

## 2022-10-13 ENCOUNTER — Other Ambulatory Visit: Payer: Self-pay | Admitting: Gastroenterology

## 2022-10-24 ENCOUNTER — Telehealth: Payer: Self-pay | Admitting: Hematology and Oncology

## 2022-10-24 NOTE — Telephone Encounter (Signed)
Patient called to verify time and dates of appointments.

## 2022-10-25 ENCOUNTER — Inpatient Hospital Stay: Payer: Medicare HMO | Attending: Hematology and Oncology

## 2022-10-25 ENCOUNTER — Encounter: Payer: Self-pay | Admitting: Hematology and Oncology

## 2022-10-25 ENCOUNTER — Inpatient Hospital Stay: Payer: Medicare HMO | Admitting: Hematology and Oncology

## 2022-10-25 VITALS — BP 142/47 | HR 67 | Resp 18 | Ht 66.5 in | Wt 134.2 lb

## 2022-10-25 DIAGNOSIS — I82422 Acute embolism and thrombosis of left iliac vein: Secondary | ICD-10-CM

## 2022-10-25 DIAGNOSIS — K5150 Left sided colitis without complications: Secondary | ICD-10-CM | POA: Insufficient documentation

## 2022-10-25 DIAGNOSIS — Z86718 Personal history of other venous thrombosis and embolism: Secondary | ICD-10-CM | POA: Diagnosis present

## 2022-10-25 DIAGNOSIS — Z79899 Other long term (current) drug therapy: Secondary | ICD-10-CM | POA: Insufficient documentation

## 2022-10-25 DIAGNOSIS — Z7901 Long term (current) use of anticoagulants: Secondary | ICD-10-CM | POA: Diagnosis present

## 2022-10-25 LAB — CBC WITH DIFFERENTIAL (CANCER CENTER ONLY)
Abs Immature Granulocytes: 0.02 10*3/uL (ref 0.00–0.07)
Basophils Absolute: 0 10*3/uL (ref 0.0–0.1)
Basophils Relative: 0 %
Eosinophils Absolute: 0.3 10*3/uL (ref 0.0–0.5)
Eosinophils Relative: 5 %
HCT: 37.9 % (ref 36.0–46.0)
Hemoglobin: 12.6 g/dL (ref 12.0–15.0)
Immature Granulocytes: 0 %
Lymphocytes Relative: 31 %
Lymphs Abs: 2 10*3/uL (ref 0.7–4.0)
MCH: 30.1 pg (ref 26.0–34.0)
MCHC: 33.2 g/dL (ref 30.0–36.0)
MCV: 90.5 fL (ref 80.0–100.0)
Monocytes Absolute: 0.6 10*3/uL (ref 0.1–1.0)
Monocytes Relative: 9 %
Neutro Abs: 3.5 10*3/uL (ref 1.7–7.7)
Neutrophils Relative %: 55 %
Platelet Count: 243 10*3/uL (ref 150–400)
RBC: 4.19 MIL/uL (ref 3.87–5.11)
RDW: 15.6 % — ABNORMAL HIGH (ref 11.5–15.5)
WBC Count: 6.4 10*3/uL (ref 4.0–10.5)
nRBC: 0 % (ref 0.0–0.2)

## 2022-10-25 LAB — BASIC METABOLIC PANEL - CANCER CENTER ONLY
Anion gap: 4 — ABNORMAL LOW (ref 5–15)
BUN: 20 mg/dL (ref 8–23)
CO2: 33 mmol/L — ABNORMAL HIGH (ref 22–32)
Calcium: 9.7 mg/dL (ref 8.9–10.3)
Chloride: 105 mmol/L (ref 98–111)
Creatinine: 0.93 mg/dL (ref 0.44–1.00)
GFR, Estimated: >60 mL/min (ref >60)
Glucose, Bld: 87 mg/dL (ref 70–99)
Potassium: 4.3 mmol/L (ref 3.5–5.1)
Sodium: 142 mmol/L (ref 135–145)

## 2022-10-25 MED ORDER — APIXABAN 5 MG PO TABS: 5.0000 mg | ORAL_TABLET | Freq: Two times a day (BID) | ORAL | 11 refills | Status: DC

## 2022-10-25 NOTE — Assessment & Plan Note (Signed)
She denies recent recurrent GI bleed while on anticoagulation therapy ?I will defer to her gastroenterologist for management ?If she have recurrent bleeding episode, I advised the patient to contact me ?

## 2022-10-25 NOTE — Progress Notes (Signed)
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   The total time spent in the appointment was 20 minutes encounter with patients including review of chart and various tests results, discussions about plan of care and coordination of care plan   All questions were answered. The patient knows to call the clinic with any problems, questions or concerns. No barriers to learning was detected.    [name], MD 4/9/202411:58 AM  INTERVAL HISTORY: Ashley Carrillo 82 y.o. female returns for follow-up She denies recent rectal bleeding or signs of bleeding elsewhere She is compliant taking her medication as prescribed  SUMMARY OF HEMATOLOGIC HISTORY:  Ashley Carrillo is seen here because of recent diagnosis of left lower extremity DVT The patient had COVID infection causing significant illness and dehydration She had other comorbidities predisposing her with risk of dehydration including ulcerative colitis She was hospitalized between 08/24/2021 to 08/29/2021 with presentation of discoloration of the left lower extremity, consistent with acute lower extremity DVT and had multiple evaluation and imaging  studies She had history of lower extremity DVT but anticoagulation was discontinued due to chronic GI bleed from ulcerative colitis.  She recall 1 episode of DVT approximately 5 years ago and a second episode approximately 10 years ago. With her last DVT episode, she was only able to tolerate a short-term.  Of anticoagulation therapy due to GI bleed  CT venogram 08/24/21 1. Acute occlusive deep venous thrombosis involving the left common iliac, external iliac, common femoral, femoral, and profundus femoral veins as above. 2. Subcutaneous edema throughout the left lower extremity. 3. Small hiatal hernia. 4.  Aortic Atherosclerosis (ICD10-I70.0).  08/25/21: venous Doppler IVC/Iliac: There is no evidence of thrombus involving the IVC. There is evidence of acute thrombus involving the left common iliac vein. There is evidence of acute thrombus involving the left external iliac vein.  Visualized segments of the right iliac veins appear patent.   08/27/21, she underwent successful thrombectomy.  Repeat imaging study of CT venogram showed  Successful LEFT lower extremity venogram, mechanical thrombectomy and iliac vein stent placement for iliocaval compression and extensive DVT, as above.  After discharge, she had follow-up appointment with interventional radiologist.  They repeated imaging study.  10/05/21: Venous Doppler 1. Resolution without doppler evidence of residual femoropopliteal DVT within the LEFT lower extremity. 2. Incidental LEFT popliteal fossa/Baker's cyst.  She denies further bleeding episodes recently while on Eliquis She denies leg pain or swelling  She denies lower extremity swelling, warmth, tenderness & erythema.  She denies recent chest pain on exertion, shortness of breath on minimal exertion, pre-syncopal episodes, hemoptysis, or palpitation. Her recent blood clot was precipitated by recent COVID infection. She denies recent history of trauma, long distance  travel,  dehydration, recent surgery, smoking or prolonged immobilization. She had no prior history or diagnosis of cancer. Her age appropriate screening programs are up-to-date. She had prior surgeries before and never had perioperative thromboembolic events. The patient had been exposed to birth control pills and hormone replacement therapy but never had thrombotic events. The patient had been pregnant before and denies history of peripartum thromboembolic event or history of recurrent miscarriages. There is no family history of blood clots or miscarriages. When I saw her in 2023, my recommendation would be to continue Eliquis indefinitely  I have reviewed the past medical history, past surgical history, social history and family history with the patient and they are unchanged from previous note.  ALLERGIES:  is allergic to amoxicillin-pot clavulanate, codeine, simvastatin, warfarin, and warfarin sodium.  MEDICATIONS:  Current Outpatient Medications  Medication Sig Dispense Refill   apixaban (ELIQUIS) 5 MG TABS tablet Take 1 tablet (5 mg total) by mouth 2 (two) times daily. 60 tablet 11   cholecalciferol (VITAMIN D3) 25 MCG (1000 UNIT) tablet Take 1,000 Units by mouth daily.     cyanocobalamin 1000 MCG tablet Take 1,000 mcg by mouth daily.     hydrocortisone 2.5 % ointment Apply 1 application topically daily as needed (to affected area).     mesalamine (LIALDA) 1.2 g EC tablet Take 4 tablets (4.8 g total) by mouth daily with breakfast. Please schedule an office visit for further refills.  Thank you 120 tablet 1   pantoprazole (PROTONIX) 20 MG tablet Take 1 tablet (20 mg total) by mouth daily. (Patient taking differently: Take 20 mg by mouth daily before breakfast.) 90 tablet 1   No current facility-administered medications for this visit.     REVIEW OF SYSTEMS:   Constitutional: Denies fevers, chills or night sweats Eyes: Denies blurriness of vision Ears, nose, mouth, throat, and face: Denies  mucositis or sore throat Respiratory: Denies cough, dyspnea or wheezes Cardiovascular: Denies palpitation, chest discomfort or lower extremity swelling Gastrointestinal:  Denies nausea, heartburn or change in bowel habits Skin: Denies abnormal skin rashes Lymphatics: Denies new lymphadenopathy or easy bruising Neurological:Denies numbness, tingling or new weaknesses Behavioral/Psych: Mood is stable, no new changes  All other systems were reviewed with the patient and are negative.  PHYSICAL EXAMINATION: ECOG PERFORMANCE STATUS: 0 - Asymptomatic  Vitals:   10/25/22 1048  BP: (!) 142/47  Pulse: 67  Resp: 18  SpO2: 100%   Filed Weights   10/25/22 1048  Weight: 134 lb 3.2 oz (60.9 kg)    GENERAL:alert, no distress and comfortable SKIN: skin color, texture, turgor are normal, no rashes or significant lesions EYES: normal, Conjunctiva are pink and non-injected, sclera clear OROPHARYNX:no exudate, no erythema and lips, buccal mucosa, and tongue normal  NECK: supple, thyroid normal size, non-tender, without nodularity LYMPH:  no palpable lymphadenopathy in the cervical, axillary or inguinal LUNGS: clear to auscultation and percussion with normal breathing effort HEART: regular rate & rhythm and no murmurs and no lower extremity edema ABDOMEN:abdomen soft, non-tender and normal bowel sounds Musculoskeletal:no cyanosis of digits and no clubbing  NEURO: alert & oriented x 3 with fluent speech, no focal motor/sensory deficits  LABORATORY DATA:  I have reviewed the data as listed     Component Value Date/Time   NA 142 10/25/2022 1034   NA 141 04/06/2020 0954   K 4.3 10/25/2022 1034   CL 105 10/25/2022 1034   CO2 33 (H) 10/25/2022 1034   GLUCOSE 87 10/25/2022 1034   BUN  20 10/25/2022 1034   BUN 20 04/06/2020 0954   CREATININE 0.93 10/25/2022 1034   CALCIUM 9.7 10/25/2022 1034   PROT 6.0 (L) 08/15/2021 1552   PROT 6.4 04/06/2020 0954   ALBUMIN 3.3 (L) 08/15/2021 1552   ALBUMIN  4.3 04/06/2020 0954   AST 18 08/15/2021 1552   ALT 9 08/15/2021 1552   ALKPHOS 46 08/15/2021 1552   BILITOT 0.4 08/15/2021 1552   BILITOT <0.2 04/06/2020 0954   GFRNONAA >60 10/25/2022 1034   GFRAA 75 04/06/2020 0954    No results found for: "SPEP", "UPEP"  Lab Results  Component Value Date   WBC 6.4 10/25/2022   NEUTROABS 3.5 10/25/2022   HGB 12.6 10/25/2022   HCT 37.9 10/25/2022   MCV 90.5 10/25/2022   PLT 243 10/25/2022      Chemistry      Component Value Date/Time   NA 142 10/25/2022 1034   NA 141 04/06/2020 0954   K 4.3 10/25/2022 1034   CL 105 10/25/2022 1034   CO2 33 (H) 10/25/2022 1034   BUN 20 10/25/2022 1034   BUN 20 04/06/2020 0954   CREATININE 0.93 10/25/2022 1034      Component Value Date/Time   CALCIUM 9.7 10/25/2022 1034   ALKPHOS 46 08/15/2021 1552   AST 18 08/15/2021 1552   ALT 9 08/15/2021 1552   BILITOT 0.4 08/15/2021 1552   BILITOT <0.2 04/06/2020 0954

## 2022-10-25 NOTE — Assessment & Plan Note (Signed)
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

## 2022-11-13 IMAGING — US US EXTREM LOW VENOUS*L*
1 series · 13 of 24 positions shown · non-contrast
Comparison: LEFT lower extremity venous duplex, 08/24/2021. IR
fluoroscopy, 08/27/2021. CT venogram, 08/24/2021.

CLINICAL DATA: History of acute LEFT lower extremity DVT s/p
thrombectomy on 08/27/2021.

EXAM:
LEFT LOWER EXTREMITY VENOUS DOPPLER ULTRASOUND
TECHNIQUE: Gray-scale sonography with compression, as well as color and duplex
ultrasound, were performed to evaluate the deep venous system(s)
from the level of the common femoral vein through the popliteal and
proximal calf veins.

[Series 1: us extrem low venous*left* · 0.06mm/px · 13 of 48 slices shown]
[im 1/48]
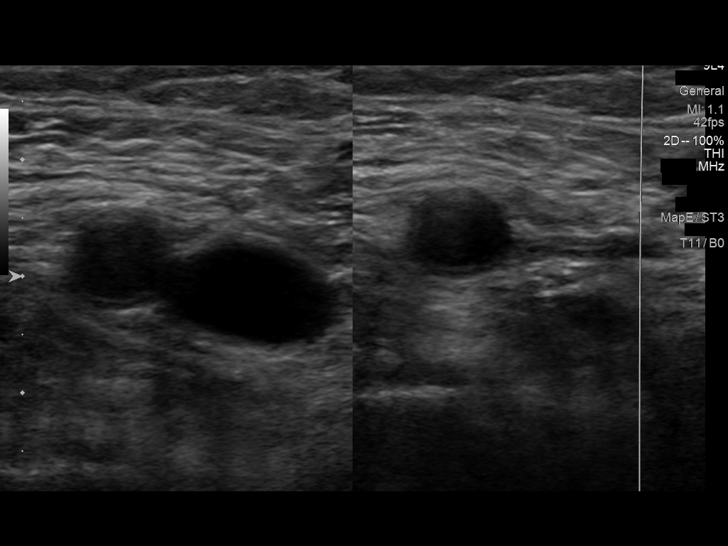
[im 5/48]
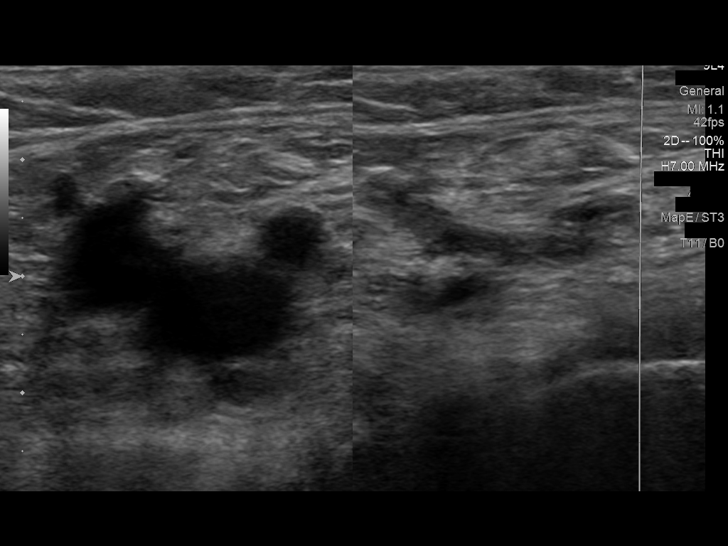
[im 9/48]
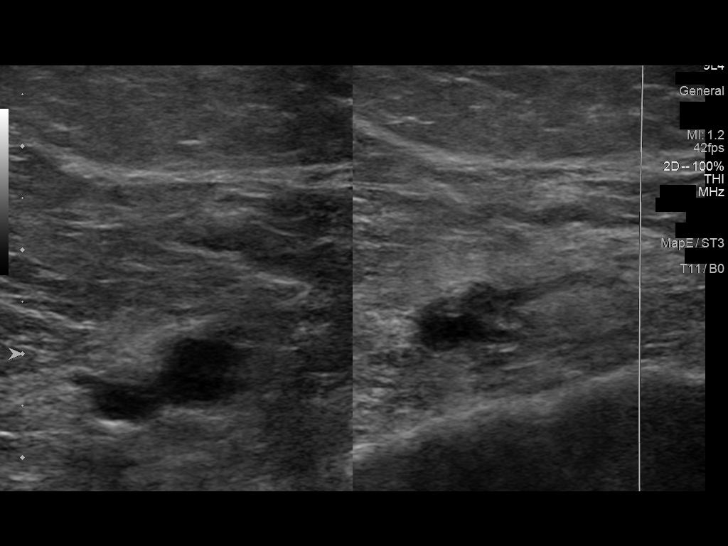
[im 13/48]
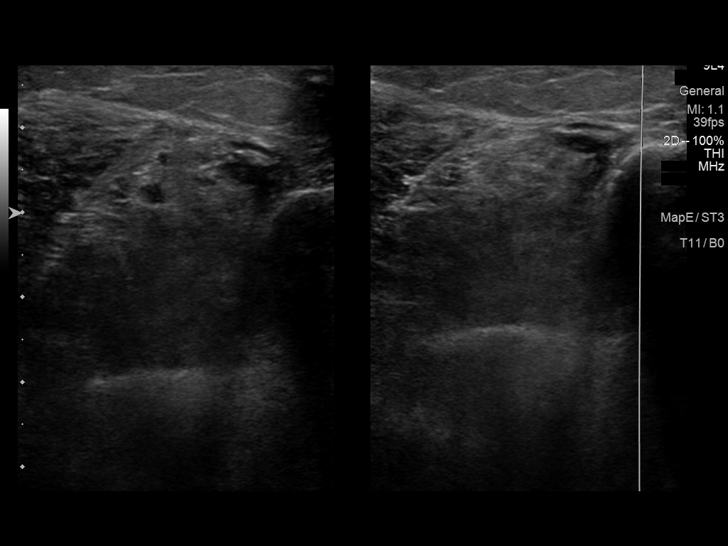
[im 17/48]
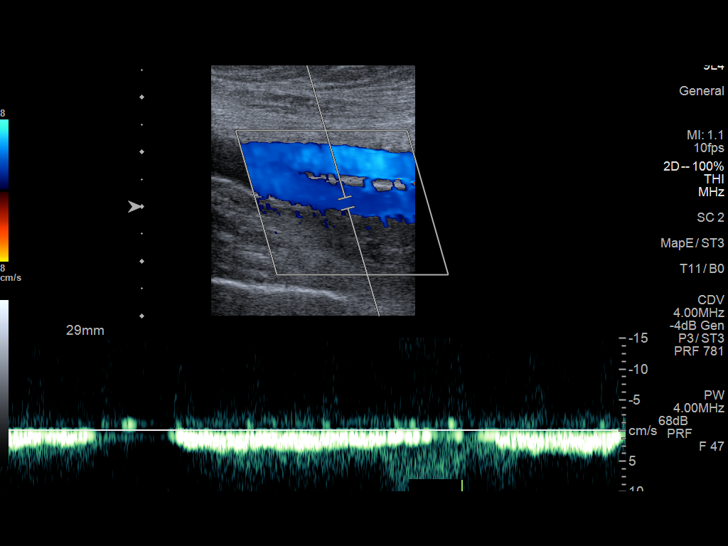
[im 21/48]
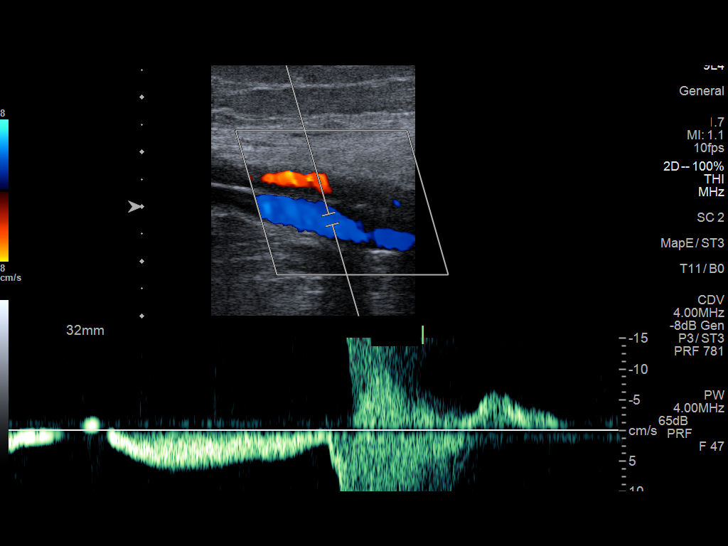
[im 25/48]
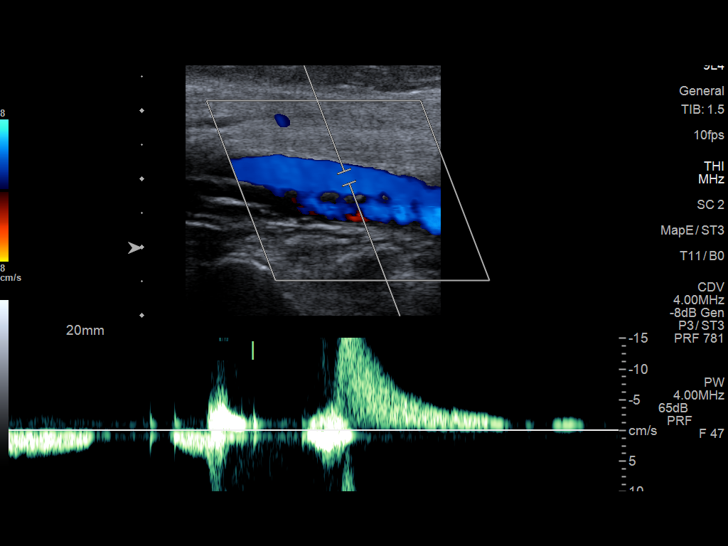
[im 27/48]
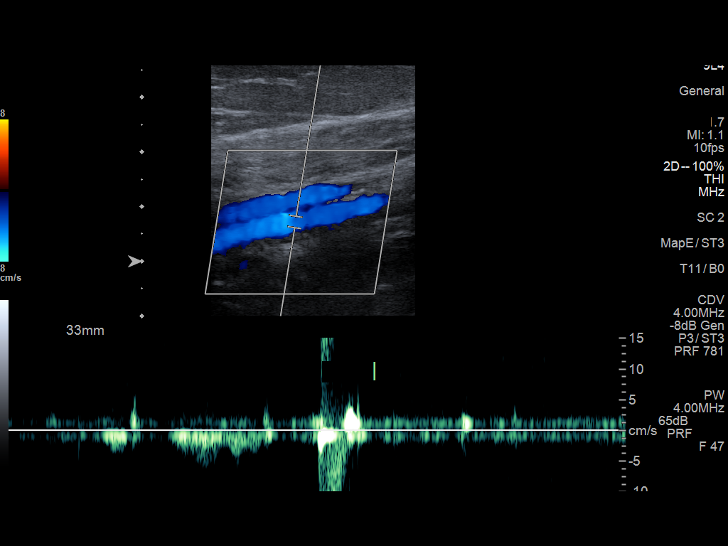
[im 31/48]
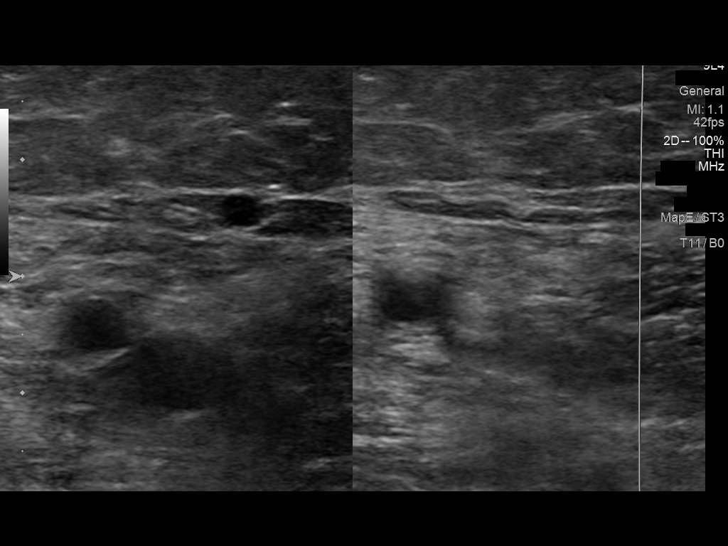
[im 35/48]
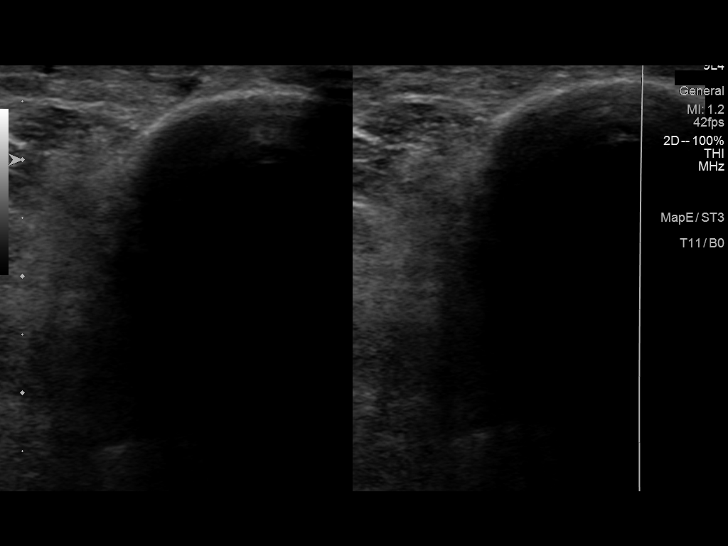
[im 39/48]
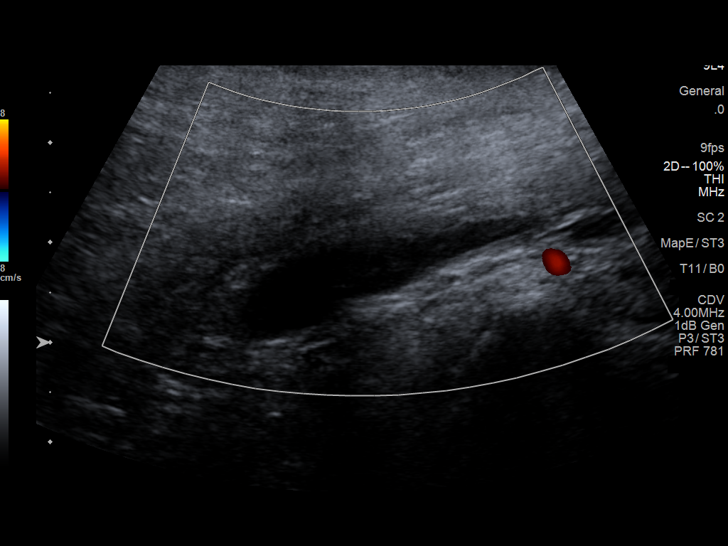
[im 43/48]
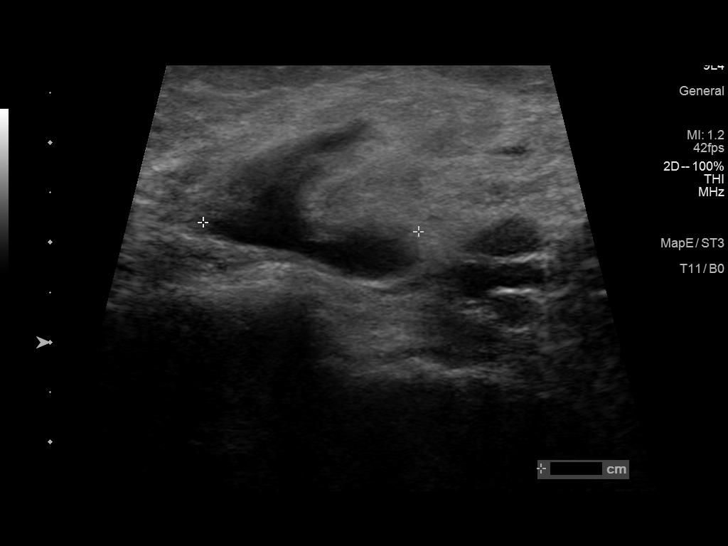
[im 48/48]
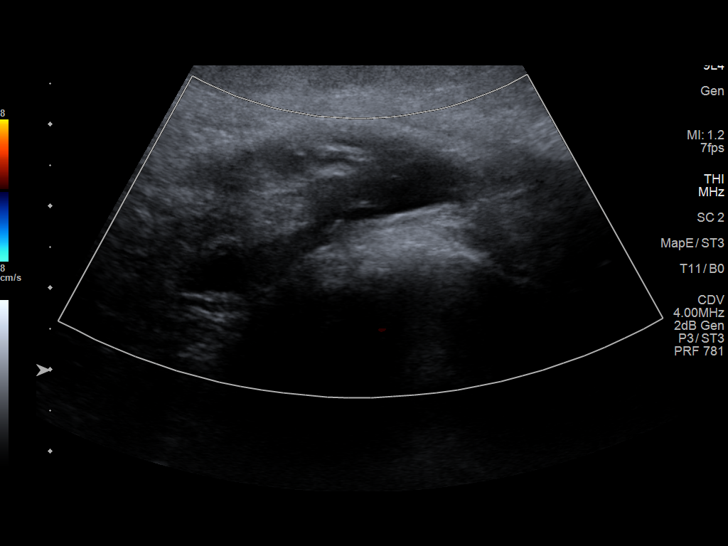

[13 of 24 positions shown; findings below may reference images not displayed]

FINDINGS: VENOUS

Normal compressibility of the LEFT common femoral, superficial
femoral, and popliteal veins, as well as the visualized calf veins.
Visualized portions of profunda femoral vein and great saphenous
vein unremarkable. No filling defects to suggest DVT on grayscale or
color Doppler imaging. Doppler waveforms show normal direction of
venous flow, normal respiratory plasticity and response to
augmentation.

Limited views of the contralateral common femoral vein are
unremarkable.

OTHER

No evidence of superficial thrombophlebitis.

A well-circumscribed anechoic subfascial collection is present at
the LEFT popliteal space, measuring up to 4.4 x 2.8 cm (L x W), and
consistent with a popliteal fossa/Baker's cyst.

Limitations: none
IMPRESSION: 1. Resolution without doppler evidence of residual femoropopliteal
DVT within the LEFT lower extremity.
2. Incidental LEFT popliteal fossa/Baker's cyst.

## 2022-12-16 ENCOUNTER — Other Ambulatory Visit: Payer: Self-pay | Admitting: Gastroenterology

## 2022-12-16 NOTE — Telephone Encounter (Signed)
I spoke to Ashley Carrillo and set her up a September office visit. She said she is doing well on her medicines. I will send in her refill.

## 2023-02-04 IMAGING — US US EXTREM LOW VENOUS*L*
1 series · 13 of 24 positions shown · non-contrast
Comparison: LEFT lower extremity venous duplex 10/05/2021 and
08/24/2020. IR fluoroscopy, 08/27/2021. CT venogram, 08/24/2021.

CLINICAL DATA: F/u LLE Thrombectomy 08/28/2021

EXAM:
LEFT LOWER EXTREMITY VENOUS DOPPLER ULTRASOUND
TECHNIQUE: Gray-scale sonography with compression, as well as color and duplex
ultrasound, were performed to evaluate the deep venous system(s)
from the level of the common femoral vein through the popliteal and
proximal calf veins.

[Series 1: us extrem low venous*left* · 0.06mm/px · 13 of 39 slices shown]
[im 1/39]
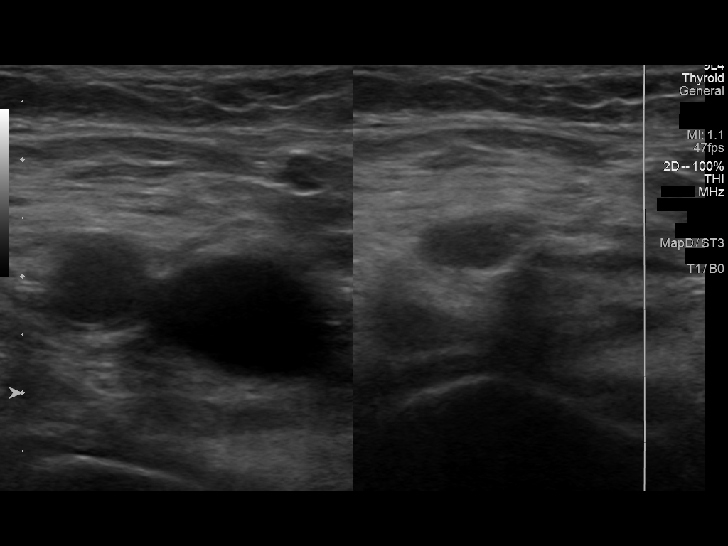
[im 4/39]
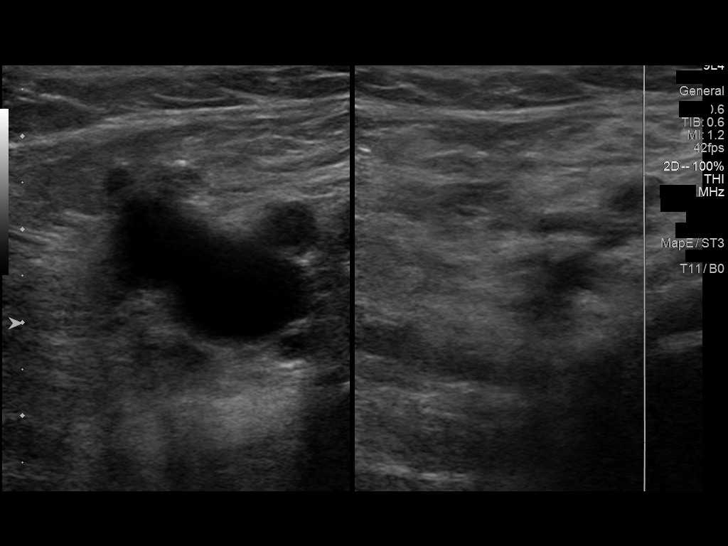
[im 7/39]
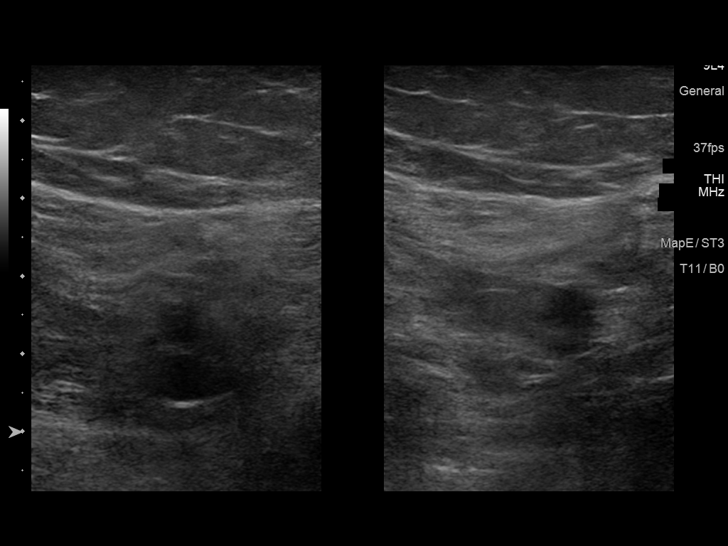
[im 10/39]
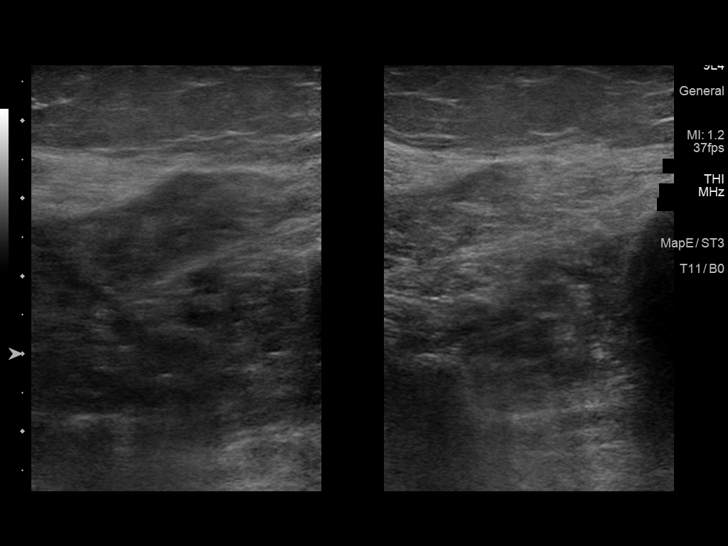
[im 14/39]
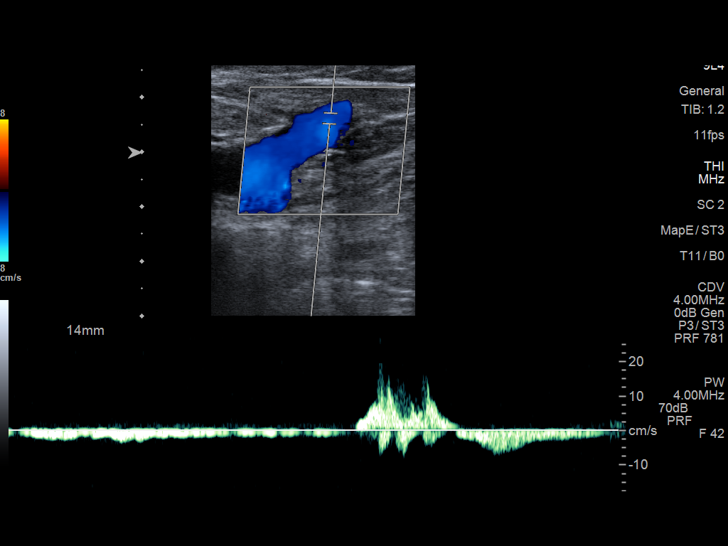
[im 17/39]
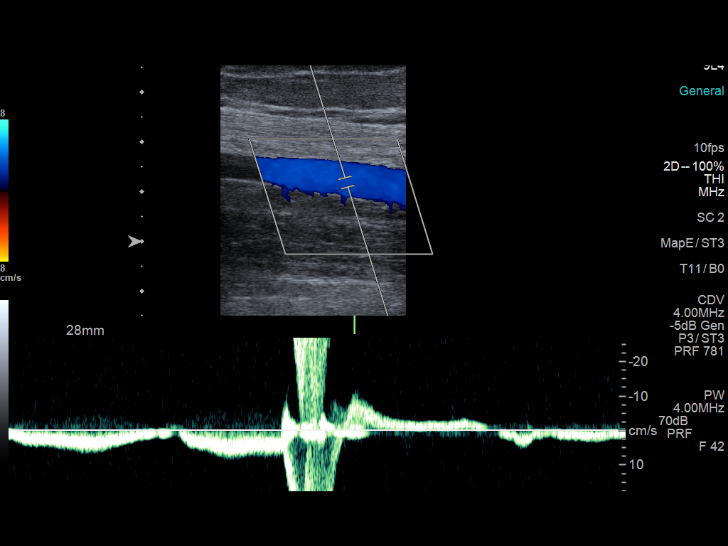
[im 20/39]
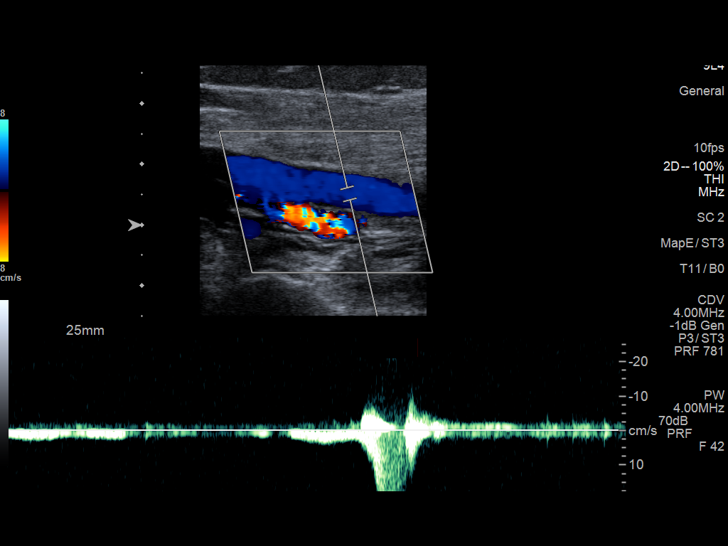
[im 22/39]
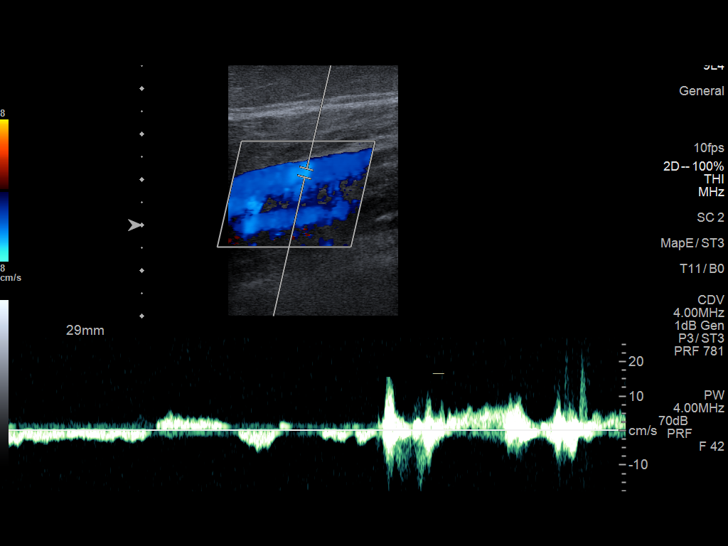
[im 25/39]
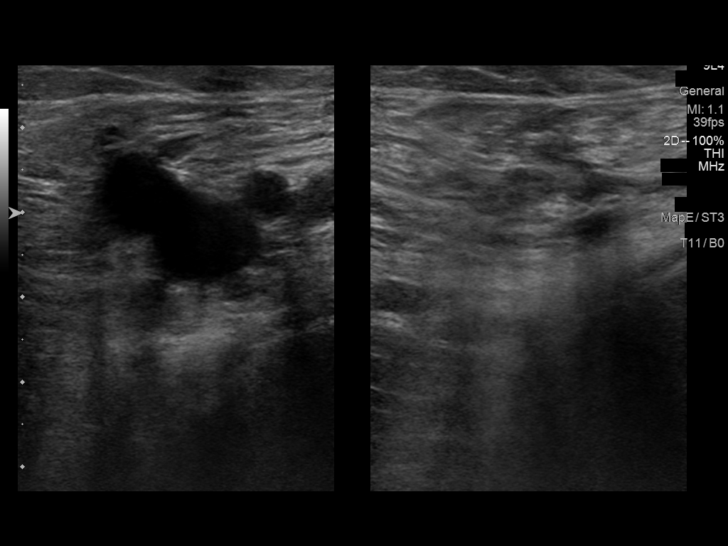
[im 29/39]
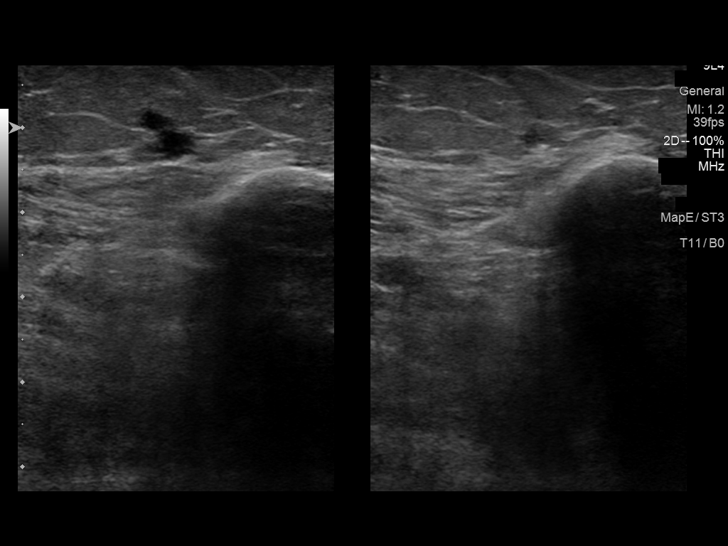
[im 32/39]
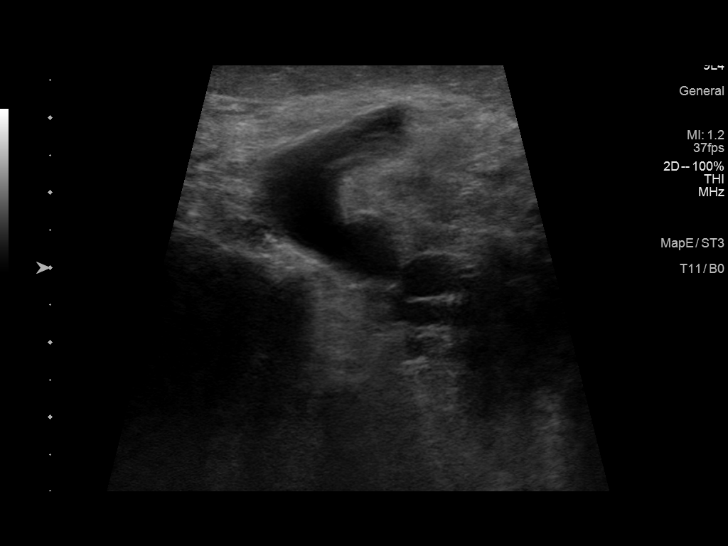
[im 35/39]
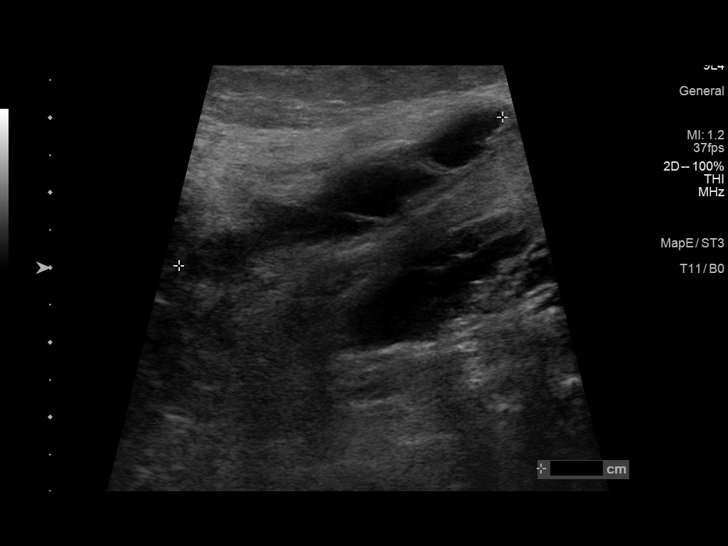
[im 39/39]
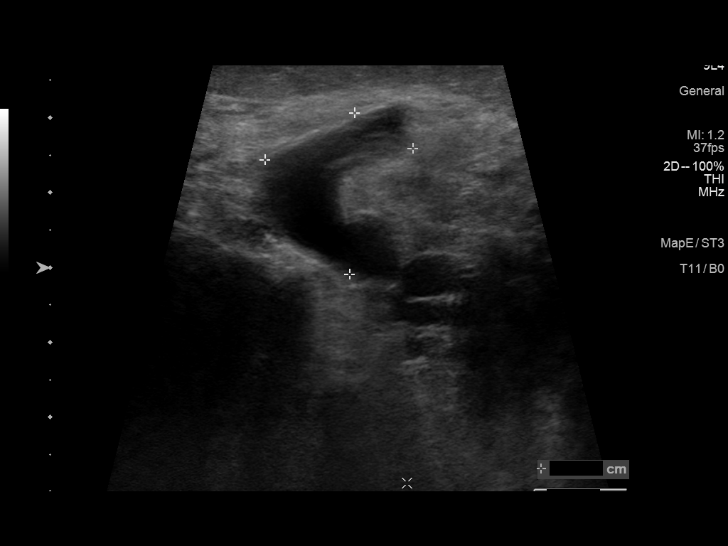

[13 of 24 positions shown; findings below may reference images not displayed]

FINDINGS: VENOUS

Normal compressibility of the LEFT common femoral, superficial
femoral, and popliteal veins, as well as the visualized calf veins.
Visualized portions of profunda femoral vein and great saphenous
vein unremarkable. No filling defects to suggest DVT on grayscale or
color Doppler imaging. Doppler waveforms show normal direction of
venous flow, normal respiratory plasticity and response to
augmentation.

Limited views of the contralateral common femoral vein are
unremarkable.

OTHER

No evidence of superficial thrombophlebitis or abnormal fluid
collection.

Similar appearance of well-circumscribed, anechoic subfascial
collection at the LEFT popliteal space, measuring 4.8 x 2.2 x
cm, consistent with a popliteal fossa/Baker cyst.

Limitations: none
IMPRESSION: 1. No residual or recurrent femoropopliteal DVT within the LEFT
lower extremity.
2. Incidental LEFT popliteal fossa/Baker cyst.

## 2023-04-04 ENCOUNTER — Ambulatory Visit: Payer: Medicare HMO | Admitting: Gastroenterology

## 2023-04-04 ENCOUNTER — Encounter: Payer: Self-pay | Admitting: Gastroenterology

## 2023-04-04 VITALS — BP 132/72 | HR 69 | Ht 66.5 in | Wt 135.0 lb

## 2023-04-04 DIAGNOSIS — Z79899 Other long term (current) drug therapy: Secondary | ICD-10-CM | POA: Diagnosis not present

## 2023-04-04 DIAGNOSIS — K5150 Left sided colitis without complications: Secondary | ICD-10-CM

## 2023-04-04 DIAGNOSIS — K222 Esophageal obstruction: Secondary | ICD-10-CM | POA: Diagnosis not present

## 2023-04-04 DIAGNOSIS — K2270 Barrett's esophagus without dysplasia: Secondary | ICD-10-CM

## 2023-04-04 MED ORDER — MESALAMINE 1.2 G PO TBEC: 4.8000 g | DELAYED_RELEASE_TABLET | Freq: Every day | ORAL | 3 refills | Status: DC

## 2023-04-04 MED ORDER — PANTOPRAZOLE SODIUM 20 MG PO TBEC: 20.0000 mg | DELAYED_RELEASE_TABLET | Freq: Every day | ORAL | 3 refills | Status: AC

## 2023-04-04 NOTE — Patient Instructions (Signed)
We have sent the following medications to your pharmacy for you to pick up at your convenience:  Lialda.  Please follow up in one year.  _______________________________________________________  If your blood pressure at your visit was 140/90 or greater, please contact your primary care physician to follow up on this.  _______________________________________________________  If you are age 83 or older, your body mass index should be between 23-30. Your Body mass index is 21.46 kg/m. If this is out of the aforementioned range listed, please consider follow up with your Primary Care Provider.  If you are age 25 or younger, your body mass index should be between 19-25. Your Body mass index is 21.46 kg/m. If this is out of the aformentioned range listed, please consider follow up with your Primary Care Provider.   ________________________________________________________  The [facility] providers would like to encourage you to use [facility] to communicate with providers for non-urgent requests or questions.  Due to long hold times on the telephone, sending your provider a message by [facility] may be a faster and more efficient way to get a response.  Please allow 48 business hours for a response.  Please remember that this is for non-urgent requests.  _______________________________________________________

## 2023-04-04 NOTE — Progress Notes (Signed)
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 She has been on prednisone over the years as needed for flares.  Historically had not  needed prednisone for a flare for several years until May 2019. She has been doing well for a long time.  She has never been on anti-TNFs or immunomodulators in the past.  She has been off all medications for a few years at least then had some flares in 2019 and 2020, now on Lialda.     SINCE LAST VISIT 82 year old female here for follow-up visit for left sided colitis.  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  I last saw her in September 2023.   Recall she had a longstanding history of left-sided colitis.  Maintained on Lialda in recent years after she had multiple flares in 2019/2020 leading to steroid use, previously she had been doing okay off therapy.  Her last colonoscopy was in 2017 off therapy and it was normal.  We had discussed doing colonoscopies in the past but she had declined.  She remains on Lialda 4 tabs daily.  We discussed this dosing for a bit, she has been on it for a while and recall that she seemed to flare despite lower dosing in the past.  The last time she was on a lower dose was several years ago however.  She continues to feel well without any complaints in regards to her bowels.  She has not had a flare since of last seen her.  No abdominal pains.  No blood in her stools.  She did have a fecal calprotectin after her last visit with me about a year ago and it was normal.  Again she wishes to avoid pursuing any surveillance colonoscopy at her age.  She also has been maintained on Protonix 20 mg daily for history of GERD and short segment Barrett's.  Recall she had an EGD a few years ago for dysphagia and she had an extremely tight stricture at the GEJ with the scope dilating itself.  Biopsies of the stricture returned as Barrett's esophagus however there  was not any overt Barrett's on the exam.  Repeat EGD a few months later the stricture was dilated to 13 mm and additional biopsies taken again showing Barrett's without any nodularity or overt Barrett's noted.  The last EGD resolved her dysphagia and she really has not had any recurrence of that since that time.  We had previously discussed doing a surveillance endoscopy to reevaluate the area, she has declined that as well.  She states the Protonix works really well for her reflux and she has no problems on the regimen.  Her renal functions been normal, she denies any history of problems with bone fracture in the past.  She does have a remote history of osteopenia.    Endoscopic history: EGD 09/21/2015 - benign stricture at the GEJ, dilated to 18mm Colonoscopy 09/21/2015 - normal colon, adenoma x 2(39mm), no dysplasia - done off all medications   Fecal calprotectin 05/17/19 - 29   EGD 02/10/21: - A 2 cm hiatal hernia was present. - One benign-appearing, intrinsic severe stenosis was found. This stenosis measured 6-7 mm (inner diameter) or so x less than one cm (in length). The stenosis was traversed with the upper endoscope, which dilated it in itself, causing multiple appropriate mucosal wrents. There was nodular / inflammatory changes at the stricture, suspect benign inflammatory /  reflux, but biopsies were taken with a cold forceps for histology and to open the stricture further. - The exam of the esophagus was otherwise normal. - The entire examined stomach was normal. 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 - The exam of the duodenum was otherwise normal.   Surgical [P], GE junction - INTESTINAL METAPLASIA CONSISTENT WITH BARRETT'S ESOPHAGUS. - NO DYSPLASIA OR MALIGNANCY.     EGD 03/19/21: - A 2 cm hiatal hernia was present. - One benign-appearing, intrinsic moderate stenosis was found. This stenosis measured less than one cm (in length). A TTS dilator was passed  through the scope. Dilation with an 11- 12-13 mm balloon dilator was performed to 11 mm, 12 mm and 13 mm. Appropriate mucosal wrents were noted. Biopsies were taken with a cold forceps for histology. No obvious Barrett's seen. - The exam of the esophagus was otherwise normal. - The entire examined stomach was normal. - There was benign ectopic gastric mucosa of the bulb noted. The examined duodenum was normal.   Surgical [P], esophageal stricture site bx AMENDED DIAGNOSIS: - GASTROESOPHAGEAL MUCOSA WITH INTESTINAL METAPLASIA CONSISTENT WITH BARRETT'S ESOPHAGUS. - NO DYSPLASIA OR CARCINOMA. - SEE MICROSCOPIC DESCRIPTION.  Fecal calprotectin 03/2022 - 16    Past Medical History:  Diagnosis Date   Arthritis    Blood clot in vein    in groin   Carotid artery stenosis, asymptomatic    COVID-19    DVT (deep vein thrombosis) in pregnancy 2022   Esophageal stenosis    GERD (gastroesophageal reflux disease)    on meds   Hiatal hernia    Hyperlipidemia    Sinusitis    Trigeminal neuralgia    Ulcerative colitis      Past Surgical History:  Procedure Laterality Date   BALLOON DILATION  07/03/2012   Procedure: BALLOON DILATION;  Surgeon: Charolett Bumpers, MD;  Location: [facility];  Service: Endoscopy;  Laterality: N/A;   BALLOON DILATION N/A 11/12/2013   Procedure: BALLOON DILATION;  Surgeon: Charolett Bumpers, MD;  Location: [facility];  Service: Endoscopy;  Laterality: N/A;   BREAST BIOPSY  11/01/2011   Procedure: BREAST BIOPSY WITH NEEDLE LOCALIZATION;  Surgeon: Ernestene Mention, MD;  Location: [facility];  Service: General;  Laterality: Right;   BREAST LUMPECTOMY  1970   right breast- benign   COLONOSCOPY WITH PROPOFOL N/A 09/21/2015   Procedure: COLONOSCOPY WITH PROPOFOL;  Surgeon: Charolett Bumpers, MD;  Location: [facility];  Service: Endoscopy;  Laterality: N/A;   CYSTECTOMY     finger   ESOPHAGOGASTRODUODENOSCOPY N/A 11/12/2013   Procedure:  ESOPHAGOGASTRODUODENOSCOPY (EGD);  Surgeon: Charolett Bumpers, MD;  Location: [facility];  Service: Endoscopy;  Laterality: N/A;   ESOPHAGOGASTRODUODENOSCOPY (EGD) WITH ESOPHAGEAL DILATION  07/03/2012   Procedure: ESOPHAGOGASTRODUODENOSCOPY (EGD) WITH ESOPHAGEAL DILATION;  Surgeon: Charolett Bumpers, MD;  Location: [facility];  Service: Endoscopy;  Laterality: N/A;   ESOPHAGOGASTRODUODENOSCOPY (EGD) WITH PROPOFOL N/A 09/21/2015   Procedure: ESOPHAGOGASTRODUODENOSCOPY (EGD) WITH PROPOFOL;  Surgeon: Charolett Bumpers, MD;  Location: [facility];  Service: Endoscopy;  Laterality: N/A;   IR RADIOLOGIST EVAL & MGMT  12/29/2021   IR THROMBECT SEC MECH MOD SED  08/27/2021   IR US GUIDE VASC ACCESS RIGHT  08/27/2021   Family History  Problem Relation Age of Onset   Heart disease Mother    Breast cancer Sister    Prostate cancer Brother    Diabetes Brother    Other Brother  unknown cause age 13   Colon cancer Neg Hx    Stomach cancer Neg Hx    Colon polyps Neg Hx    Esophageal cancer Neg Hx    Rectal cancer Neg Hx    Social History   Tobacco Use   Smoking status: Former    Current packs/day: 0.00    Average packs/day: 1 pack/day for 10.0 years (10.0 ttl pk-yrs)    Types: Cigarettes    Start date: 07/18/1958    Quit date: 07/18/1968    Years since quitting: 54.7   Smokeless tobacco: Never  Vaping Use   Vaping status: Never Used  Substance Use Topics   Alcohol use: No   Drug use: No   Current Outpatient Medications  Medication Sig Dispense Refill   apixaban (ELIQUIS) 5 MG TABS tablet Take 1 tablet (5 mg total) by mouth 2 (two) times daily. 60 tablet 11   cholecalciferol (VITAMIN D3) 25 MCG (1000 UNIT) tablet Take 1,000 Units by mouth daily.     cyanocobalamin 1000 MCG tablet Take 1,000 mcg by mouth daily.     hydrocortisone 2.5 % ointment Apply 1 application topically daily as needed (to affected area).     mesalamine (LIALDA) 1.2 g EC tablet Take 4 tablets (4.8 g total) by mouth  daily with breakfast. 120 tablet 3   pantoprazole (PROTONIX) 20 MG tablet Take 1 tablet (20 mg total) by mouth daily. (Patient taking differently: Take 20 mg by mouth daily before breakfast.) 90 tablet 1   No current facility-administered medications for this visit.   Allergies  Allergen Reactions   Amoxicillin-Pot Clavulanate Diarrhea   Codeine Nausea And Vomiting and Other (See Comments)    "drunk feeling and it puts me on the floor"    Simvastatin Other (See Comments)    "Made my legs hurt terribly bad"   Warfarin Other (See Comments)    History of "ulcerative colitis"   Warfarin Sodium Other (See Comments)    History of "ulcerative colitis" and "Blood thinning, so contraindicated"     Review of Systems: All systems reviewed and negative except where noted in HPI.   Lab Results  Component Value Date   WBC 6.4 10/25/2022   HGB 12.6 10/25/2022   HCT 37.9 10/25/2022   MCV 90.5 10/25/2022   PLT 243 10/25/2022    Lab Results  Component Value Date   NA 142 10/25/2022   CL 105 10/25/2022   K 4.3 10/25/2022   CO2 33 (H) 10/25/2022   BUN 20 10/25/2022   CREATININE 0.93 10/25/2022   GFRNONAA >60 10/25/2022   CALCIUM 9.7 10/25/2022   ALBUMIN 3.3 (L) 08/15/2021   GLUCOSE 87 10/25/2022    Lab Results  Component Value Date   ALT 9 08/15/2021   AST 18 08/15/2021   ALKPHOS 46 08/15/2021   BILITOT 0.4 08/15/2021     Physical Exam: BP 132/72 (BP Location: Left Arm, Patient Position: Sitting)   Pulse 69   Ht 5' 6.5" (1.689 m)   Wt 135 lb (61.2 kg)   SpO2 95%   BMI 21.46 kg/m  Constitutional: Pleasant,well-developed, female in no acute distress. Neurological: Alert and oriented to person place and time. Psychiatric: Normal mood and affect. Behavior is normal.   ASSESSMENT: 82 y.o. female here for assessment of the following  1. Left sided ulcerative colitis without complication (HCC)   2. Esophageal stricture   3. Barrett's esophagus without dysplasia   4.  Long-term current use of proton pump inhibitor  therapy    Well-controlled longstanding colitis on current dosing of Lialda.  She is on 4 tabs per day and has been doing well on the dosing for a few years after she had some flares a few years ago.  We discussed the regimen and long-term risks.  She is tolerating it okay.  Will try reducing her dose slowly to see how she tolerates it.  Will use 4 tabs 1 day and then 2 tabs the next and alternate back-and-forth for a month or 2 and see how she does.  If she does well with this can reduce to 2 tabs daily.  If she flares on this dose she should increase back to 4 times daily.  We discussed her colitis history, risks for malignancy over time.  She understands this, she declines further surveillance colonoscopy at her age if she is otherwise feeling well.  Otherwise she has done quite well post dilation of her stricture a few years ago even though it was only to 13 mm.  No overt Barrett's on the stricture at all but biopsies show that.  No dysplasia.  She has been on Protonix daily in this light as well as to reduce her risk for stricture recurrence.  She tolerates it okay, GERD controlled, she understands risks and wishes to continue.  We discussed Barrett's and risks for malignancy over time.  This would appear to be a very short segment.  She declines surveillance EGD as long she feels well.  PLAN: - continue Lialda - she will try reducing to 4 tabs once daily and then 2 tabs the next, back and forth, see if she can wean to 2 tabs / day - declines surveillance colonoscopy - no dysphagia, did have microscopic BE - discussed this, she declines follow up EGD - continue protonix, discussed risks, feels benefits outweigh risks - f/u one year or sooner with issues  Harlin Rain, MD [facility]

## 2023-05-09 ENCOUNTER — Telehealth: Payer: Self-pay | Admitting: Gastroenterology

## 2023-05-09 NOTE — Telephone Encounter (Signed)
A year supply of Lialda was sent in September.  Called and informed patient. She said [facility] said she can pick some up now

## 2023-05-09 NOTE — Telephone Encounter (Signed)
Patient called stated she needs a refill order for Mesalamine medication does not have any at this time.

## 2023-10-26 ENCOUNTER — Other Ambulatory Visit: Payer: Medicare HMO

## 2023-10-26 ENCOUNTER — Other Ambulatory Visit: Payer: Self-pay

## 2023-10-26 ENCOUNTER — Ambulatory Visit: Payer: Medicare HMO | Admitting: Hematology and Oncology

## 2023-10-26 DIAGNOSIS — I82422 Acute embolism and thrombosis of left iliac vein: Secondary | ICD-10-CM

## 2023-10-27 ENCOUNTER — Encounter: Payer: Self-pay | Admitting: Hematology and Oncology

## 2023-10-27 ENCOUNTER — Other Ambulatory Visit: Payer: Self-pay

## 2023-10-27 ENCOUNTER — Inpatient Hospital Stay (HOSPITAL_BASED_OUTPATIENT_CLINIC_OR_DEPARTMENT_OTHER): Admitting: Hematology and Oncology

## 2023-10-27 ENCOUNTER — Inpatient Hospital Stay: Attending: Internal Medicine

## 2023-10-27 VITALS — BP 128/55 | HR 78 | Temp 97.4°F | Resp 18 | Ht 66.5 in | Wt 137.4 lb

## 2023-10-27 DIAGNOSIS — Z7901 Long term (current) use of anticoagulants: Secondary | ICD-10-CM | POA: Diagnosis not present

## 2023-10-27 DIAGNOSIS — I82422 Acute embolism and thrombosis of left iliac vein: Secondary | ICD-10-CM

## 2023-10-27 DIAGNOSIS — Z86718 Personal history of other venous thrombosis and embolism: Secondary | ICD-10-CM | POA: Insufficient documentation

## 2023-10-27 LAB — CBC WITH DIFFERENTIAL (CANCER CENTER ONLY)
Abs Immature Granulocytes: 0.01 10*3/uL (ref 0.00–0.07)
Basophils Absolute: 0 10*3/uL (ref 0.0–0.1)
Basophils Relative: 1 %
Eosinophils Absolute: 0.2 10*3/uL (ref 0.0–0.5)
Eosinophils Relative: 3 %
HCT: 36.8 % (ref 36.0–46.0)
Hemoglobin: 12.1 g/dL (ref 12.0–15.0)
Immature Granulocytes: 0 %
Lymphocytes Relative: 31 %
Lymphs Abs: 1.8 10*3/uL (ref 0.7–4.0)
MCH: 29 pg (ref 26.0–34.0)
MCHC: 32.9 g/dL (ref 30.0–36.0)
MCV: 88.2 fL (ref 80.0–100.0)
Monocytes Absolute: 0.7 10*3/uL (ref 0.1–1.0)
Monocytes Relative: 11 %
Neutro Abs: 3.2 10*3/uL (ref 1.7–7.7)
Neutrophils Relative %: 54 %
Platelet Count: 258 10*3/uL (ref 150–400)
RBC: 4.17 MIL/uL (ref 3.87–5.11)
RDW: 15.1 % (ref 11.5–15.5)
WBC Count: 5.8 10*3/uL (ref 4.0–10.5)
nRBC: 0 % (ref 0.0–0.2)

## 2023-10-27 LAB — BASIC METABOLIC PANEL - CANCER CENTER ONLY
Anion gap: 5 (ref 5–15)
BUN: 19 mg/dL (ref 8–23)
CO2: 30 mmol/L (ref 22–32)
Calcium: 9.3 mg/dL (ref 8.9–10.3)
Chloride: 107 mmol/L (ref 98–111)
Creatinine: 0.89 mg/dL (ref 0.44–1.00)
GFR, Estimated: >60 mL/min (ref >60)
Glucose, Bld: 81 mg/dL (ref 70–99)
Potassium: 3.9 mmol/L (ref 3.5–5.1)
Sodium: 142 mmol/L (ref 135–145)

## 2023-10-27 MED ORDER — APIXABAN 5 MG PO TABS: 5.0000 mg | ORAL_TABLET | Freq: Two times a day (BID) | ORAL | 11 refills | Status: AC

## 2023-10-27 NOTE — Progress Notes (Signed)
 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    Artis Delay, MD  INTERVAL HISTORY: she returns for surveillance follow-up for history of DVT/PE Patient denies recent bleeding such as epistaxis, hematuria or hematochezia We reviewed medication list and discussed medication changes We discussed test results and future plan of care as outlined above  PHYSICAL EXAMINATION: ECOG PERFORMANCE STATUS: 0 - Asymptomatic  Vitals:   10/27/23 1118  BP: (!) 128/55  Pulse: 78  Resp: 18  Temp: (!) 97.4 F (36.3 C)  SpO2: 100%   Lab Results  Component Value Date   WBC 5.8 10/27/2023   HGB 12.1 10/27/2023   HCT 36.8 10/27/2023   MCV 88.2 10/27/2023   PLT 258 10/27/2023    SUMMARY OF HEMATOLOGIC HISTORY:  Ashley Carrillo is seen here because of recent diagnosis of left lower extremity DVT The patient had COVID infection causing significant illness and dehydration She had other comorbidities predisposing her with risk of dehydration including ulcerative colitis She was hospitalized between 08/24/2021 to 08/29/2021 with presentation of discoloration of the left lower extremity, consistent with acute lower extremity DVT and had multiple evaluation and imaging studies She had history of lower extremity DVT but anticoagulation was discontinued due to chronic GI bleed from ulcerative colitis.  She  recall 1 episode of DVT approximately 5 years ago and a second episode approximately 10 years ago. With her last DVT episode, she was only able to tolerate a short-term.  Of anticoagulation therapy due to GI bleed  CT venogram 08/24/21 1. Acute occlusive deep venous thrombosis involving the left common iliac, external iliac, common femoral, femoral, and profundus femoral veins as above. 2. Subcutaneous edema throughout the left lower extremity. 3. Small hiatal hernia. 4.  Aortic Atherosclerosis (ICD10-I70.0).  08/25/21: venous Doppler IVC/Iliac: There is no evidence of thrombus involving the IVC. There is evidence of acute thrombus involving the left common iliac vein. There is evidence of acute thrombus involving the left external iliac vein.  Visualized segments of the right iliac veins appear patent.   08/27/21, she underwent successful thrombectomy.  Repeat imaging study of CT venogram showed  Successful LEFT lower extremity venogram, mechanical thrombectomy and iliac vein stent placement for iliocaval compression and extensive DVT, as above.  After discharge, she had follow-up appointment with interventional radiologist.  They repeated imaging study.  10/05/21: Venous Doppler 1. Resolution without doppler evidence of residual femoropopliteal DVT within the LEFT lower extremity. 2. Incidental LEFT popliteal fossa/Baker's cyst.  She denies further bleeding episodes recently while on Eliquis She denies leg pain or swelling  She denies lower extremity swelling, warmth, tenderness & erythema.  She denies recent chest pain on exertion, shortness of breath on minimal exertion, pre-syncopal episodes, hemoptysis, or palpitation. Her recent blood clot was precipitated by recent COVID infection. She denies recent history of trauma, long distance travel, dehydration, recent surgery, smoking or prolonged immobilization.  She had no prior history or diagnosis of cancer. Her age appropriate screening  programs are up-to-date. She had prior surgeries before and never had perioperative thromboembolic events. The patient had been exposed to birth control pills and hormone replacement therapy but never had thrombotic events. The patient had been pregnant before and denies history of peripartum thromboembolic event or history of recurrent miscarriages. There is no family history of blood clots or miscarriages. When I saw her in 2023, my recommendation would be to continue Eliquis indefinitely

## 2023-10-27 NOTE — Assessment & Plan Note (Addendum)
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

## 2023-10-30 ENCOUNTER — Telehealth: Payer: Self-pay | Admitting: Hematology and Oncology

## 2023-10-30 NOTE — Telephone Encounter (Signed)
 Confirmed with pt about scheduled appt date and time

## 2024-05-29 ENCOUNTER — Other Ambulatory Visit: Payer: Self-pay | Admitting: Gastroenterology

## 2024-10-29 ENCOUNTER — Other Ambulatory Visit

## 2024-10-29 ENCOUNTER — Ambulatory Visit: Admitting: Hematology and Oncology
